# Patient Record
Sex: Male | Born: 1960 | Race: Black or African American | Hispanic: No | Marital: Single | Smoking: Current every day smoker
Health system: Southern US, Community
[De-identification: ages and names within clinical notes are randomized; demographics above are authoritative.]

## PROBLEM LIST (undated history)

## (undated) DIAGNOSIS — E042 Nontoxic multinodular goiter: Secondary | ICD-10-CM

## (undated) DIAGNOSIS — J449 Chronic obstructive pulmonary disease, unspecified: Secondary | ICD-10-CM

## (undated) DIAGNOSIS — K219 Gastro-esophageal reflux disease without esophagitis: Secondary | ICD-10-CM

## (undated) DIAGNOSIS — I10 Essential (primary) hypertension: Secondary | ICD-10-CM

## (undated) DIAGNOSIS — F259 Schizoaffective disorder, unspecified: Secondary | ICD-10-CM

## (undated) DIAGNOSIS — N184 Chronic kidney disease, stage 4 (severe): Secondary | ICD-10-CM

## (undated) HISTORY — PX: TOOTH EXTRACTION: SUR596

## (undated) HISTORY — PX: TONSILLECTOMY: SUR1361

---

## 2006-10-07 ENCOUNTER — Ambulatory Visit: Payer: Self-pay | Admitting: Nephrology

## 2008-07-05 ENCOUNTER — Ambulatory Visit: Payer: Self-pay | Admitting: Oncology

## 2008-07-27 LAB — MORPHOLOGY - CHCC SATELLITE
PLT EST ~~LOC~~: ADEQUATE
Platelet Morphology: NORMAL
RBC Comments: NORMAL

## 2008-07-27 LAB — CBC WITH DIFFERENTIAL (CANCER CENTER ONLY)
BASO%: 0.7 % (ref 0.0–2.0)
EOS%: 2.9 % (ref 0.0–7.0)
HCT: 42.7 % (ref 38.7–49.9)
LYMPH%: 44.5 % (ref 14.0–48.0)
MCH: 28 pg (ref 28.0–33.4)
MCHC: 32.5 g/dL (ref 32.0–35.9)
MCV: 86 fL (ref 82–98)
MONO#: 0.3 10*3/uL (ref 0.1–0.9)
MONO%: 6.7 % (ref 0.0–13.0)
NEUT%: 45.2 % (ref 40.0–80.0)
Platelets: 184 10*3/uL (ref 145–400)
RDW: 12.1 % (ref 10.5–14.6)

## 2008-07-27 LAB — CMP (CANCER CENTER ONLY)
ALT(SGPT): 18 U/L (ref 10–47)
AST: 23 U/L (ref 11–38)
Alkaline Phosphatase: 65 U/L (ref 26–84)
Creat: 2 mg/dl — ABNORMAL HIGH (ref 0.6–1.2)
Sodium: 140 mEq/L (ref 128–145)
Total Bilirubin: 0.5 mg/dl (ref 0.20–1.60)
Total Protein: 7.4 g/dL (ref 6.4–8.1)

## 2017-01-23 ENCOUNTER — Encounter: Payer: Self-pay | Admitting: *Deleted

## 2017-01-23 ENCOUNTER — Inpatient Hospital Stay
Admission: AD | Admit: 2017-01-23 | Discharge: 2017-01-28 | DRG: 683 | Disposition: A | Discharge status: Skilled Nursing Facility | Payer: Medicare Other | Source: Ambulatory Visit | Attending: Internal Medicine | Admitting: Internal Medicine

## 2017-01-23 DIAGNOSIS — R339 Retention of urine, unspecified: Secondary | ICD-10-CM | POA: Diagnosis present

## 2017-01-23 DIAGNOSIS — E861 Hypovolemia: Secondary | ICD-10-CM | POA: Diagnosis present

## 2017-01-23 DIAGNOSIS — F17210 Nicotine dependence, cigarettes, uncomplicated: Secondary | ICD-10-CM | POA: Diagnosis present

## 2017-01-23 DIAGNOSIS — J449 Chronic obstructive pulmonary disease, unspecified: Secondary | ICD-10-CM | POA: Diagnosis present

## 2017-01-23 DIAGNOSIS — F259 Schizoaffective disorder, unspecified: Secondary | ICD-10-CM | POA: Diagnosis present

## 2017-01-23 DIAGNOSIS — N179 Acute kidney failure, unspecified: Secondary | ICD-10-CM | POA: Diagnosis present

## 2017-01-23 DIAGNOSIS — E042 Nontoxic multinodular goiter: Secondary | ICD-10-CM | POA: Diagnosis present

## 2017-01-23 DIAGNOSIS — E210 Primary hyperparathyroidism: Secondary | ICD-10-CM | POA: Diagnosis present

## 2017-01-23 DIAGNOSIS — N184 Chronic kidney disease, stage 4 (severe): Secondary | ICD-10-CM | POA: Diagnosis present

## 2017-01-23 DIAGNOSIS — I129 Hypertensive chronic kidney disease with stage 1 through stage 4 chronic kidney disease, or unspecified chronic kidney disease: Secondary | ICD-10-CM | POA: Diagnosis present

## 2017-01-23 DIAGNOSIS — N320 Bladder-neck obstruction: Secondary | ICD-10-CM | POA: Diagnosis present

## 2017-01-23 DIAGNOSIS — E871 Hypo-osmolality and hyponatremia: Secondary | ICD-10-CM

## 2017-01-23 DIAGNOSIS — E876 Hypokalemia: Secondary | ICD-10-CM | POA: Diagnosis present

## 2017-01-23 DIAGNOSIS — E870 Hyperosmolality and hypernatremia: Secondary | ICD-10-CM | POA: Diagnosis present

## 2017-01-23 DIAGNOSIS — E785 Hyperlipidemia, unspecified: Secondary | ICD-10-CM | POA: Diagnosis present

## 2017-01-23 DIAGNOSIS — K219 Gastro-esophageal reflux disease without esophagitis: Secondary | ICD-10-CM | POA: Diagnosis present

## 2017-01-23 DIAGNOSIS — R319 Hematuria, unspecified: Secondary | ICD-10-CM | POA: Diagnosis present

## 2017-01-23 DIAGNOSIS — R338 Other retention of urine: Secondary | ICD-10-CM | POA: Diagnosis not present

## 2017-01-23 HISTORY — DX: Chronic obstructive pulmonary disease, unspecified: J44.9

## 2017-01-23 HISTORY — DX: Chronic kidney disease, stage 4 (severe): N18.4

## 2017-01-23 HISTORY — DX: Gastro-esophageal reflux disease without esophagitis: K21.9

## 2017-01-23 HISTORY — DX: Nontoxic multinodular goiter: E04.2

## 2017-01-23 HISTORY — DX: Schizoaffective disorder, unspecified: F25.9

## 2017-01-23 HISTORY — DX: Essential (primary) hypertension: I10

## 2017-01-23 LAB — COMPREHENSIVE METABOLIC PANEL
ALBUMIN: 4 g/dL (ref 3.5–5.0)
ALT: 24 U/L (ref 17–63)
ANION GAP: 11 (ref 5–15)
AST: 31 U/L (ref 15–41)
Alkaline Phosphatase: 117 U/L (ref 38–126)
BUN: 39 mg/dL — AB (ref 6–20)
CHLORIDE: 107 mmol/L (ref 101–111)
CO2: 20 mmol/L — ABNORMAL LOW (ref 22–32)
Calcium: 10.5 mg/dL — ABNORMAL HIGH (ref 8.9–10.3)
Creatinine, Ser: 4.87 mg/dL — ABNORMAL HIGH (ref 0.61–1.24)
GFR calc Af Amer: 14 mL/min — ABNORMAL LOW (ref >60)
GFR calc non Af Amer: 12 mL/min — ABNORMAL LOW (ref >60)
GLUCOSE: 136 mg/dL — AB (ref 65–99)
Potassium: 3.5 mmol/L (ref 3.5–5.1)
SODIUM: 138 mmol/L (ref 135–145)
Total Bilirubin: 0.5 mg/dL (ref 0.3–1.2)
Total Protein: 7.9 g/dL (ref 6.5–8.1)

## 2017-01-23 LAB — CBC
HCT: 37.1 % — ABNORMAL LOW (ref 40.0–52.0)
Hemoglobin: 12.5 g/dL — ABNORMAL LOW (ref 13.0–18.0)
MCH: 30.3 pg (ref 26.0–34.0)
MCHC: 33.7 g/dL (ref 32.0–36.0)
MCV: 90 fL (ref 80.0–100.0)
PLATELETS: 187 10*3/uL (ref 150–440)
RBC: 4.13 MIL/uL — ABNORMAL LOW (ref 4.40–5.90)
RDW: 15.2 % — AB (ref 11.5–14.5)
WBC: 4.3 10*3/uL (ref 3.8–10.6)

## 2017-01-23 LAB — MRSA PCR SCREENING: MRSA by PCR: NEGATIVE

## 2017-01-23 MED ORDER — EZETIMIBE 10 MG PO TABS
10.0000 mg | ORAL_TABLET | Freq: Every day | ORAL | Status: DC
  Administered 2017-01-23 – 2017-01-28 (×6): 10 mg via ORAL
  Filled 2017-01-23 (×6): qty 1

## 2017-01-23 MED ORDER — ASPIRIN 81 MG PO CHEW
81.0000 mg | CHEWABLE_TABLET | Freq: Every day | ORAL | Status: DC
  Administered 2017-01-23 – 2017-01-27 (×5): 81 mg via ORAL
  Filled 2017-01-23 (×5): qty 1

## 2017-01-23 MED ORDER — PRAVASTATIN SODIUM 20 MG PO TABS
40.0000 mg | ORAL_TABLET | Freq: Every day | ORAL | Status: DC
  Administered 2017-01-23 – 2017-01-27 (×5): 40 mg via ORAL
  Filled 2017-01-23 (×5): qty 2

## 2017-01-23 MED ORDER — METOPROLOL SUCCINATE ER 100 MG PO TB24
100.0000 mg | ORAL_TABLET | Freq: Every day | ORAL | Status: DC
  Administered 2017-01-23 – 2017-01-28 (×6): 100 mg via ORAL
  Filled 2017-01-23 (×6): qty 1

## 2017-01-23 MED ORDER — AMLODIPINE BESYLATE 5 MG PO TABS
5.0000 mg | ORAL_TABLET | Freq: Every day | ORAL | Status: DC
  Administered 2017-01-23 – 2017-01-28 (×6): 5 mg via ORAL
  Filled 2017-01-23 (×6): qty 1

## 2017-01-23 MED ORDER — RISPERIDONE 1 MG PO TABS
4.0000 mg | ORAL_TABLET | Freq: Every day | ORAL | Status: DC
Start: 2017-01-23 — End: 2017-01-28
  Administered 2017-01-23 – 2017-01-27 (×5): 4 mg via ORAL
  Filled 2017-01-23 (×5): qty 4
  Filled 2017-01-23: qty 2

## 2017-01-23 MED ORDER — QUETIAPINE FUMARATE 200 MG PO TABS
200.0000 mg | ORAL_TABLET | Freq: Two times a day (BID) | ORAL | Status: DC
  Administered 2017-01-24 – 2017-01-28 (×9): 200 mg via ORAL
  Filled 2017-01-23: qty 1
  Filled 2017-01-23: qty 8
  Filled 2017-01-23 (×5): qty 1
  Filled 2017-01-23 (×2): qty 8
  Filled 2017-01-23 (×2): qty 1

## 2017-01-23 MED ORDER — SODIUM CHLORIDE 0.9 % IV SOLN
INTRAVENOUS | Status: DC
  Administered 2017-01-23 – 2017-01-25 (×3): via INTRAVENOUS

## 2017-01-23 MED ORDER — QUETIAPINE FUMARATE 200 MG PO TABS
400.0000 mg | ORAL_TABLET | Freq: Every day | ORAL | Status: DC
  Administered 2017-01-23 – 2017-01-27 (×5): 400 mg via ORAL
  Filled 2017-01-23: qty 1
  Filled 2017-01-23 (×5): qty 2

## 2017-01-23 MED ORDER — PANTOPRAZOLE SODIUM 40 MG PO TBEC
40.0000 mg | DELAYED_RELEASE_TABLET | Freq: Every day | ORAL | Status: DC
  Administered 2017-01-23 – 2017-01-28 (×6): 40 mg via ORAL
  Filled 2017-01-23 (×6): qty 1

## 2017-01-23 MED ORDER — TIOTROPIUM BROMIDE MONOHYDRATE 18 MCG IN CAPS
18.0000 ug | ORAL_CAPSULE | Freq: Every day | RESPIRATORY_TRACT | Status: DC
  Administered 2017-01-23 – 2017-01-28 (×6): 18 ug via RESPIRATORY_TRACT
  Filled 2017-01-23 (×2): qty 5

## 2017-01-23 MED ORDER — ONDANSETRON HCL 4 MG PO TABS: 4.0000 mg | ORAL_TABLET | Freq: Four times a day (QID) | ORAL | Status: DC | PRN

## 2017-01-23 MED ORDER — ONDANSETRON HCL 4 MG/2ML IJ SOLN: 4.0000 mg | Freq: Four times a day (QID) | INTRAMUSCULAR | Status: DC | PRN

## 2017-01-23 MED ORDER — HEPARIN SODIUM (PORCINE) 5000 UNIT/ML IJ SOLN
5000.0000 [IU] | Freq: Three times a day (TID) | INTRAMUSCULAR | Status: DC
  Administered 2017-01-23 – 2017-01-26 (×8): 5000 [IU] via SUBCUTANEOUS
  Filled 2017-01-23 (×6): qty 1

## 2017-01-23 MED ORDER — ACETAMINOPHEN 325 MG PO TABS
650.0000 mg | ORAL_TABLET | Freq: Four times a day (QID) | ORAL | Status: DC | PRN
  Administered 2017-01-27: 650 mg via ORAL
  Filled 2017-01-23: qty 2

## 2017-01-23 MED ORDER — ACETAMINOPHEN 650 MG RE SUPP: 650.0000 mg | Freq: Four times a day (QID) | RECTAL | Status: DC | PRN

## 2017-01-23 NOTE — H&P (Signed)
[facility] [place] at [facility]   PATIENT NAME: Jason Diaz    MR#:  [id number]  DATE OF BIRTH:  Dec 09, 1960  DATE OF ADMISSION:  01/23/2017  PRIMARY CARE PHYSICIAN: Armando Gang, FNP   REQUESTING/REFERRING PHYSICIAN: Dr. Mosetta Pigeon  CHIEF COMPLAINT:  No chief complaint on file.   HISTORY OF PRESENT ILLNESS:  Jason Diaz  is a 56 y.o. male with a known history of COPD not on oxygen, hypertension, multinodular goiter, schizoaffective disorder, CK D stage IV who lives in a group home was sent to the hospital referred by his nephrologist. Patient feels completely fine and is not sure why he is in the hospital. Most of the information is obtained from old records and also speaking to his nephrologist. Patient has a baseline creatinine of 3.68 with a GFR of 18 from March 2018. When his PCP checked his most recent labs it seems like his creatinine was greater than 4 a GFR of 15. He was thought to be dehydrated and is being admitted for acute renal failure on CK D. He denies any vomiting, but complains of some nausea. No fevers or chills or chest pain. No breathing difficulties.  PAST MEDICAL HISTORY:   Past Medical History:  Diagnosis Date  . CKD (chronic kidney disease), stage IV (HCC)   . COPD (chronic obstructive pulmonary disease) (HCC)   . GERD (gastroesophageal reflux disease)   . Hypertension   . Multinodular goiter   . Schizoaffective disorder (HCC)     PAST SURGICAL HISTORY:   Past Surgical History:  Procedure Laterality Date  . TONSILLECTOMY    . TOOTH EXTRACTION      SOCIAL HISTORY:   Social History  Substance Use Topics  . Smoking status: Current Every Day Smoker    Packs/day: 0.25    Types: Cigarettes  . Smokeless tobacco: Not on file  . Alcohol use No    FAMILY HISTORY:   Family History  Problem Relation Age of Onset  . Kidney disease Father   . Diabetes Father     DRUG ALLERGIES:  Not on File No known drug  allergies  REVIEW OF SYSTEMS:   Review of Systems  Constitutional: Positive for malaise/fatigue. Negative for chills, fever and weight loss.  HENT: Negative for ear discharge, ear pain, hearing loss, nosebleeds and tinnitus.   Eyes: Negative for blurred vision, double vision and photophobia.  Respiratory: Negative for cough, hemoptysis, shortness of breath and wheezing.   Cardiovascular: Negative for chest pain, palpitations, orthopnea and leg swelling.  Gastrointestinal: Positive for nausea. Negative for abdominal pain, constipation, diarrhea, heartburn, melena and vomiting.  Genitourinary: Negative for dysuria, frequency, hematuria and urgency.  Musculoskeletal: Negative for back pain, myalgias and neck pain.  Skin: Negative for rash.  Neurological: Negative for dizziness, tingling, tremors, sensory change, speech change, focal weakness and headaches.  Endo/Heme/Allergies: Does not bruise/bleed easily.  Psychiatric/Behavioral: Negative for depression.    MEDICATIONS AT HOME:   Prior to Admission medications   Not on File      VITAL SIGNS:  Blood pressure (!) 140/97, pulse 83, temperature 98.6 F (37 C), temperature source Oral, resp. rate 18, height 5\' 5"  (1.651 m), weight 81.7 kg (180 lb 3.2 oz), SpO2 99 %.  PHYSICAL EXAMINATION:   Physical Exam  GENERAL:  56 y.o.-year-old patient lying in the bed with no acute distress.  EYES: Pupils equal, round, reactive to light and accommodation. No scleral icterus. Extraocular muscles intact.  HEENT: Head atraumatic, normocephalic.  Oropharynx and nasopharynx clear.  NECK:  Supple, no jugular venous distention.enalrged thyroid but soft and nodular,, no tenderness.  LUNGS: Normal breath sounds bilaterally, no wheezing, rales,rhonchi or crepitation. No use of accessory muscles of respiration.  CARDIOVASCULAR: S1, S2 normal. No murmurs, rubs, or gallops.  ABDOMEN: Soft, nontender, nondistended. Bowel sounds present. No organomegaly or  mass.  EXTREMITIES: No pedal edema, cyanosis, or clubbing.  NEUROLOGIC: Cranial nerves II through XII are intact. Muscle strength 5/5 in all extremities. Sensation intact. Gait not checked.  PSYCHIATRIC: The patient is alert and oriented x 1-2.  SKIN: No obvious rash, lesion, or ulcer.   LABORATORY PANEL:   CBC No results for input(s): WBC, HGB, HCT, PLT in the last 168 hours. ------------------------------------------------------------------------------------------------------------------  Chemistries  No results for input(s): NA, K, CL, CO2, GLUCOSE, BUN, CREATININE, CALCIUM, MG, AST, ALT, ALKPHOS, BILITOT in the last 168 hours.  Invalid input(s): GFRCGP ------------------------------------------------------------------------------------------------------------------  Cardiac Enzymes No results for input(s): TROPONINI in the last 168 hours. ------------------------------------------------------------------------------------------------------------------  RADIOLOGY:  No results found.  EKG:  No orders found for this or any previous visit.  IMPRESSION AND PLAN:   Jason Diaz  is a 56 y.o. male with a known history of COPD not on oxygen, hypertension, multinodular goiter, schizoaffective disorder, CK D stage IV who lives in a group home was sent to the hospital referred by his nephrologist.  #1 acute renal failure-on CK D stage IV versus progression of CK D-for now hold Lasix, gentle hydration. -Nephrology consulted. Avoid nephrotoxins. -Labs from today are pending -Monitor labs in a.m.  #2 GERD-on Protonix  #3 hypertension-continue home medications. Patient on Norvasc, Toprol. Lasix is on hold.  #4 schizoaffective disorder-continue Seroquel, risperidone, fluphenazine  #5 COPD-stable. Continue inhalers.  #6 DVT prophylaxis-on subcutaneous heparin    All the records are reviewed and case discussed with ED provider. Management plans discussed with the patient, family  and they are in agreement.  CODE STATUS: Full Code  TOTAL TIME TAKING CARE OF THIS PATIENT: 50 minutes.    Enid BaasKALISETTI,Tzirel Leonor M.D on 01/23/2017 at 6:59 PM  Between 7am to 6pm - Pager - [phone]  After 6pm go to [email or web address] - Social research officer, governmentpassword EPAS [facility]  [facility]  Office  [phone]  CC: Primary care physician; Armando GangLindley, Cheryl P, FNP

## 2017-01-24 ENCOUNTER — Inpatient Hospital Stay: Payer: Medicare Other

## 2017-01-24 LAB — BASIC METABOLIC PANEL
ANION GAP: 9 (ref 5–15)
BUN: 38 mg/dL — AB (ref 6–20)
CO2: 22 mmol/L (ref 22–32)
Calcium: 10.7 mg/dL — ABNORMAL HIGH (ref 8.9–10.3)
Chloride: 111 mmol/L (ref 101–111)
Creatinine, Ser: 4.57 mg/dL — ABNORMAL HIGH (ref 0.61–1.24)
GFR calc Af Amer: 16 mL/min — ABNORMAL LOW (ref >60)
GFR calc non Af Amer: 13 mL/min — ABNORMAL LOW (ref >60)
Glucose, Bld: 129 mg/dL — ABNORMAL HIGH (ref 65–99)
POTASSIUM: 3.3 mmol/L — AB (ref 3.5–5.1)
Sodium: 142 mmol/L (ref 135–145)

## 2017-01-24 LAB — CBC
HEMATOCRIT: 37.3 % — AB (ref 40.0–52.0)
HEMOGLOBIN: 12.6 g/dL — AB (ref 13.0–18.0)
MCH: 29.7 pg (ref 26.0–34.0)
MCHC: 33.7 g/dL (ref 32.0–36.0)
MCV: 88 fL (ref 80.0–100.0)
PLATELETS: 198 10*3/uL (ref 150–440)
RBC: 4.24 MIL/uL — ABNORMAL LOW (ref 4.40–5.90)
RDW: 14.8 % — ABNORMAL HIGH (ref 11.5–14.5)
WBC: 4.2 10*3/uL (ref 3.8–10.6)

## 2017-01-24 LAB — HIV ANTIBODY (ROUTINE TESTING W REFLEX): HIV Screen 4th Generation wRfx: NONREACTIVE

## 2017-01-24 MED ORDER — POLYETHYLENE GLYCOL 3350 17 GM/SCOOP PO POWD
1.0000 | Freq: Once | ORAL | Status: DC
  Filled 2017-01-24: qty 255

## 2017-01-24 MED ORDER — POLYETHYLENE GLYCOL 3350 17 G PO PACK
17.0000 g | PACK | Freq: Once | ORAL | Status: AC
  Administered 2017-01-24: 17 g via ORAL
  Filled 2017-01-24: qty 1

## 2017-01-24 NOTE — Progress Notes (Signed)
[facility] [place] [facility]   PATIENT NAME: Jason RockersMichael Diaz    MR#:  [id number]  DATE OF BIRTH:  June 15, 1961  SUBJECTIVE:   No acute events overnight  REVIEW OF SYSTEMS:    Review of Systems  Constitutional: Negative for fever, chills weight loss HENT: Negative for ear pain, nosebleeds, congestion, facial swelling, rhinorrhea, neck pain, neck stiffness and ear discharge.   Respiratory: Negative for cough, shortness of breath, wheezing  Cardiovascular: Negative for chest pain, palpitations and leg swelling.  Gastrointestinal: Negative for heartburn, abdominal pain, vomiting, diarrhea or consitpation Genitourinary: Negative for dysuria, urgency, frequency, hematuria Musculoskeletal: Negative for back pain or joint pain Neurological: Negative for dizziness, seizures, syncope, focal weakness,  numbness and headaches.  Hematological: Does not bruise/bleed easily.  Psychiatric/Behavioral: Schizoaffective disorder    Tolerating Diet: yes      DRUG ALLERGIES:  Not on File  VITALS:  Blood pressure (!) 166/95, pulse 79, temperature 97.9 F (36.6 C), temperature source Oral, resp. rate 18, height 5\' 5"  (1.651 m), weight 81.7 kg (180 lb 3.2 oz), SpO2 100 %.  PHYSICAL EXAMINATION:  Constitutional: Appears well-developed and well-nourished. No distress. HENT: Normocephalic. Marland Kitchen. Oropharynx is clear and moist.  Eyes: Conjunctivae and EOM are normal. PERRLA, no scleral icterus.  Neck: Normal ROM. Neck supple. No JVD. No tracheal deviation. CVS: RRR, S1/S2 +, no murmurs, no gallops, no carotid bruit.  Pulmonary: Effort and breath sounds normal, no stridor, rhonchi, wheezes, rales.  Abdominal: Soft. BS +,  no distension, tenderness, rebound or guarding.  Musculoskeletal: Normal range of motion. No edema and no tenderness.  Neuro: Alert. CN 2-12 grossly intact. No focal deficits. Skin: Skin is warm and dry. No rash noted. Psychiatric: Normal mood and affect.       LABORATORY PANEL:   CBC  Recent Labs Lab 01/24/17 0456  WBC 4.2  HGB 12.6*  HCT 37.3*  PLT 198   ------------------------------------------------------------------------------------------------------------------  Chemistries   Recent Labs Lab 01/23/17 1917 01/24/17 0456  NA 138 142  K 3.5 3.3*  CL 107 111  CO2 20* 22  GLUCOSE 136* 129*  BUN 39* 38*  CREATININE 4.87* 4.57*  CALCIUM 10.5* 10.7*  AST 31  --   ALT 24  --   ALKPHOS 117  --   BILITOT 0.5  --    ------------------------------------------------------------------------------------------------------------------  Cardiac Enzymes No results for input(s): TROPONINI in the last 168 hours. ------------------------------------------------------------------------------------------------------------------  RADIOLOGY:  No results found.   ASSESSMENT AND PLAN:   56 year old male with schizoaffective disorder who is from a group home who was sent by nephrologist due to increasing creatinine.   1. Acute on chronic stage IV kidney disease thought to be due to hypovolemia/prerenal azotemia This is improving with IV fluids BMP for a.m. Follow daily output Follow up on nephrology consult   2. Schizoaffective disorder: Continue Seroquel, Risperdal 3. COPD without exacerbation: Continue inhalers  4. Essential hypertension: Continue metoprolol and Norvasc  5. Hyperlipidemia: Continue Zetia and pravastatin     Management plans discussed with the patient and he is in agreement.  CODE STATUS: full  TOTAL TIME TAKING CARE OF THIS PATIENT: 30 minutes.     POSSIBLE D/C tomorrow, DEPENDING ON CLINICAL CONDITION.   Karmine Kauer M.D on 01/24/2017 at 8:38 AM  Between 7am to 6pm - Pager - [phone] After 6pm go to [email or web address] - Social research officer, governmentpassword EPAS [facility]  [facility]  Office  [phone]  CC: Primary care physician; Armando GangLindley, Cheryl P, FNP  Note: This  dictation was prepared with  Dragon dictation along with smaller phrase technology. Any transcriptional errors that result from this process are unintentional.

## 2017-01-24 NOTE — Progress Notes (Signed)
[facility] [place], [place] 01/24/17  Subjective:   Patient is known to our practice and is followed for CKD He had labs checked at PMD office. S Cr was higher than baseline. Patient was admitted for ARF This morning, he is somnolent but able to answer questions He denies any acute complaints  Objective:  Vital signs in last 24 hours:  Temp:  [97.5 F (36.4 C)-98.6 F (37 C)] 97.9 F (36.6 C) (06/22 0623) Pulse Rate:  [79-85] 80 (06/22 1111) Resp:  [18] 18 (06/22 0623) BP: (136-166)/(87-122) 136/87 (06/22 1111) SpO2:  [99 %-100 %] 100 % (06/22 0623) Weight:  [81.7 kg (180 lb 3.2 oz)] 81.7 kg (180 lb 3.2 oz) (06/21 1743)  Weight change:  Filed Weights   01/23/17 1743  Weight: 81.7 kg (180 lb 3.2 oz)    Intake/Output:    Intake/Output Summary (Last 24 hours) at 01/24/17 1227 Last data filed at 01/24/17 0954  Gross per 24 hour  Intake           696.99 ml  Output             1650 ml  Net          -953.01 ml     Physical Exam: General: NAD, laying in bed  HEENT Anicteric, moist oral mucus membranes  Neck supple  Pulm/lungs Clear b/l  CVS/Heart Regular; no rub  Abdomen:  Soft, distended,   Extremities: No edema  Neurologic: Alert; able to answer simple questions  Skin: No acute rashes          Basic Metabolic Panel:   Recent Labs Lab 01/23/17 1917 01/24/17 0456  NA 138 142  K 3.5 3.3*  CL 107 111  CO2 20* 22  GLUCOSE 136* 129*  BUN 39* 38*  CREATININE 4.87* 4.57*  CALCIUM 10.5* 10.7*     CBC:  Recent Labs Lab 01/23/17 1917 01/24/17 0456  WBC 4.3 4.2  HGB 12.5* 12.6*  HCT 37.1* 37.3*  MCV 90.0 88.0  PLT 187 198     No results found for: HEPBSAG, HEPBSAB, HEPBIGM    Microbiology:  Recent Results (from the past 240 hour(s))  MRSA PCR Screening     Status: None   Collection Time: 01/23/17  6:16 PM  Result Value Ref Range Status   MRSA by PCR NEGATIVE NEGATIVE Final    Comment:        The GeneXpert MRSA Assay  (FDA approved for NASAL specimens only), is one component of a comprehensive MRSA colonization surveillance program. It is not intended to diagnose MRSA infection nor to guide or monitor treatment for MRSA infections.     Coagulation Studies: No results for input(s): LABPROT, INR in the last 72 hours.  Urinalysis: No results for input(s): COLORURINE, LABSPEC, PHURINE, GLUCOSEU, HGBUR, BILIRUBINUR, KETONESUR, PROTEINUR, UROBILINOGEN, NITRITE, LEUKOCYTESUR in the last 72 hours.  Invalid input(s): APPERANCEUR    Imaging: No results found.   Medications:   . sodium chloride 60 mL/hr at 01/23/17 2021   . amLODipine  5 mg Oral Daily  . aspirin  81 mg Oral Daily  . ezetimibe  10 mg Oral Daily  . heparin  5,000 Units Subcutaneous Q8H  . metoprolol succinate  100 mg Oral Daily  . pantoprazole  40 mg Oral Daily  . polyethylene glycol  17 g Oral Once  . pravastatin  40 mg Oral q1800  . QUEtiapine  200 mg Oral BID  . QUEtiapine  400 mg Oral QHS  .  risperiDONE  4 mg Oral QHS  . tiotropium  18 mcg Inhalation Daily   acetaminophen **OR** acetaminophen, ondansetron **OR** ondansetron (ZOFRAN) IV  Assessment/ Plan:  56 y.o. AA male with COPD, HTN, Multinodular goiter, Schizoaffective disorder, CKD st 4, Primary hyperparathyroidism with hypercalcemia admitted for ARF  1. ARF on CKD st 4 Baseline Cr 3.39/GFR 23 from march 2018 Admit Cr 4.87 which improved some to 4.57 today with iv fluids Agree with holding lasix for now May use it PRN Will obtain renal ultrasound  2. Hypercalcemia with primary hyperparathyroidism Mild hypercalcemia, continue iv hydration Followed by Dr Aliene Altes Recommended total thyroidectomy and parathyroidectomy as outpatient  Patient has outpatient f/u at our office on 01/31/17 at 12 PM   LOS: 1 [facility] 6/22/201812:27 PM  [facility] [address] [place], [place] [phone]

## 2017-01-24 NOTE — Clinical Social Work Note (Signed)
Clinical Social Work Assessment  Patient Details  Name: Jason Diaz MRN: [id number] Date of Birth: November 15, 1960  Date of referral:  01/24/17               Reason for consult:  Facility Placement                Permission sought to share information with:    Permission granted to share information::     Name::        Agency::     Relationship::     Contact Information:     Housing/Transportation Living arrangements for the past 2 months:  Group Home Source of Information:  Facility Patient Interpreter Needed:  None Criminal Activity/Legal Involvement Pertinent to Current Situation/Hospitalization:  No - Comment as needed Significant Relationships:  Parents Lives with:  Facility Resident Do you feel safe going back to the place where you live?  Yes Need for family participation in patient care:  Yes (Comment)  Care giving concerns:  [facility] resides at [facility].   Social Worker assessment / plan:  Patient currently sleeping and patient's nurse stated patient had been up all night and he needed to rest. CSW contacted patient's group home and spoke with the owner: Jason Diaz: cell: [phone]. Ms. Jason Diaz stated that patient can return when time and CSW informed her it may be as early as tomorrow. She has requested that staff call her on her cell phone and she will provide transportation. Patient's mother is heavily involved in his decision making and CSW is told that there is no official guardian, hpoa, or poa for patient. Fl2 initiated and will need completion at time of discharge.  Employment status:  Disabled (Comment on whether or not currently receiving Disability) Insurance information:  Medicare PT Recommendations:    Information / Referral to community resources:     Patient/Family's Response to care:  Unable to speak with patient at this time.  Patient/Family's Understanding of and Emotional Response to Diagnosis, Current Treatment, and  Prognosis:  Unable to speak with patient at this time.   Emotional Assessment Appearance:  Appears stated age Attitude/Demeanor/Rapport:   (pleasant and cooperative) Affect (typically observed):  Calm, Pleasant Orientation:  Oriented to Self, Oriented to Place Alcohol / Substance use:  Not Applicable Psych involvement (Current and /or in the community):  Outpatient Provider  Discharge Needs  Concerns to be addressed:  Care Coordination Readmission within the last 30 days:  No Current discharge risk:  None Barriers to Discharge:  No Barriers Identified   Jason SpanielMonica Jason Gonyer, LCSW 01/24/2017, 11:39 AM

## 2017-01-25 LAB — BASIC METABOLIC PANEL
ANION GAP: 11 (ref 5–15)
BUN: 37 mg/dL — AB (ref 6–20)
CO2: 22 mmol/L (ref 22–32)
Calcium: 10.8 mg/dL — ABNORMAL HIGH (ref 8.9–10.3)
Chloride: 109 mmol/L (ref 101–111)
Creatinine, Ser: 3.89 mg/dL — ABNORMAL HIGH (ref 0.61–1.24)
GFR calc non Af Amer: 16 mL/min — ABNORMAL LOW (ref >60)
GFR, EST AFRICAN AMERICAN: 19 mL/min — AB (ref >60)
Glucose, Bld: 116 mg/dL — ABNORMAL HIGH (ref 65–99)
POTASSIUM: 3.7 mmol/L (ref 3.5–5.1)
SODIUM: 142 mmol/L (ref 135–145)

## 2017-01-25 LAB — RENAL FUNCTION PANEL
Albumin: 3.7 g/dL (ref 3.5–5.0)
Anion gap: 9 (ref 5–15)
BUN: 36 mg/dL — ABNORMAL HIGH (ref 6–20)
CHLORIDE: 114 mmol/L — AB (ref 101–111)
CO2: 23 mmol/L (ref 22–32)
CREATININE: 4.3 mg/dL — AB (ref 0.61–1.24)
Calcium: 10.6 mg/dL — ABNORMAL HIGH (ref 8.9–10.3)
GFR, EST AFRICAN AMERICAN: 17 mL/min — AB (ref >60)
GFR, EST NON AFRICAN AMERICAN: 14 mL/min — AB (ref >60)
Glucose, Bld: 114 mg/dL — ABNORMAL HIGH (ref 65–99)
PHOSPHORUS: 3.7 mg/dL (ref 2.5–4.6)
Potassium: 3.4 mmol/L — ABNORMAL LOW (ref 3.5–5.1)
Sodium: 146 mmol/L — ABNORMAL HIGH (ref 135–145)

## 2017-01-25 MED ORDER — AMLODIPINE BESYLATE 5 MG PO TABS
5.0000 mg | ORAL_TABLET | Freq: Once | ORAL | Status: AC
Start: 2017-01-25 — End: 2017-01-25
  Administered 2017-01-25: 5 mg via ORAL
  Filled 2017-01-25: qty 1

## 2017-01-25 MED ORDER — TAMSULOSIN HCL 0.4 MG PO CAPS
0.4000 mg | ORAL_CAPSULE | Freq: Every day | ORAL | Status: DC
  Administered 2017-01-25 – 2017-01-28 (×4): 0.4 mg via ORAL
  Filled 2017-01-25 (×4): qty 1

## 2017-01-25 MED ORDER — POTASSIUM CHLORIDE IN NACL 20-0.45 MEQ/L-% IV SOLN
INTRAVENOUS | Status: DC
  Administered 2017-01-25: 12:00:00 via INTRAVENOUS
  Filled 2017-01-25 (×2): qty 1000

## 2017-01-25 MED ORDER — FINASTERIDE 5 MG PO TABS
5.0000 mg | ORAL_TABLET | Freq: Every day | ORAL | Status: DC
  Administered 2017-01-25 – 2017-01-28 (×4): 5 mg via ORAL
  Filled 2017-01-25 (×4): qty 1

## 2017-01-25 NOTE — Progress Notes (Signed)
Notified Dr. Winona LegatoVaickute of BP of 159/102. IV fluids have been stopped at this time and will continue to monitor and call with BP in 1 hour.

## 2017-01-25 NOTE — Progress Notes (Signed)
[facility], [place] 01/25/17  Subjective:   Patient is known to our practice from outpatient and is followed for CKD He had labs checked at PMD office. S Cr was higher than baseline. Patient was admitted for ARF This morning, he is doing well. He denies any acute complaints Nursing staff report urinary retention.  He voids over 1 L and overflows the urinal  Objective:  Vital signs in last 24 hours:  Temp:  [97.9 F (36.6 C)-98.2 F (36.8 C)] 97.9 F (36.6 C) (06/23 0449) Pulse Rate:  [78-88] 80 (06/23 1013) Resp:  [16-19] 16 (06/23 0449) BP: (128-167)/(73-120) 128/73 (06/23 1013) SpO2:  [99 %-100 %] 100 % (06/23 0449)  Weight change:  Filed Weights   01/23/17 1743  Weight: 81.7 kg (180 lb 3.2 oz)    Intake/Output:    Intake/Output Summary (Last 24 hours) at 01/25/17 1125 Last data filed at 01/25/17 0720  Gross per 24 hour  Intake             1406 ml  Output             2000 ml  Net             -594 ml     Physical Exam: General: NAD, laying in bed  HEENT Anicteric, moist oral mucus membranes  Neck supple  Pulm/lungs Clear b/l  CVS/Heart Regular; no rub  Abdomen:  Soft, distended,   Extremities: No edema  Neurologic: Alert; able to answer simple questions  Skin: No acute rashes          Basic Metabolic Panel:   Recent Labs Lab 01/23/17 1917 01/24/17 0456 01/25/17 0333  NA 138 142 146*  K 3.5 3.3* 3.4*  CL 107 111 114*  CO2 20* 22 23  GLUCOSE 136* 129* 114*  BUN 39* 38* 36*  CREATININE 4.87* 4.57* 4.30*  CALCIUM 10.5* 10.7* 10.6*  PHOS  --   --  3.7     CBC:  Recent Labs Lab 01/23/17 1917 01/24/17 0456  WBC 4.3 4.2  HGB 12.5* 12.6*  HCT 37.1* 37.3*  MCV 90.0 88.0  PLT 187 198     No results found for: HEPBSAG, HEPBSAB, HEPBIGM    Microbiology:  Recent Results (from the past 240 hour(s))  MRSA PCR Screening     Status: None   Collection Time: 01/23/17  6:16 PM  Result Value Ref Range Status   MRSA by PCR NEGATIVE NEGATIVE Final    Comment:        The GeneXpert MRSA Assay (FDA approved for NASAL specimens only), is one component of a comprehensive MRSA colonization surveillance program. It is not intended to diagnose MRSA infection nor to guide or monitor treatment for MRSA infections.     Coagulation Studies: No results for input(s): LABPROT, INR in the last 72 hours.  Urinalysis: No results for input(s): COLORURINE, LABSPEC, PHURINE, GLUCOSEU, HGBUR, BILIRUBINUR, KETONESUR, PROTEINUR, UROBILINOGEN, NITRITE, LEUKOCYTESUR in the last 72 hours.  Invalid input(s): APPERANCEUR    Imaging: US Renal  Result Date: 01/24/2017 CLINICAL DATA:  Acute renal failure. EXAM: RENAL / URINARY TRACT ULTRASOUND COMPLETE COMPARISON:  None. FINDINGS: Right Kidney: Length: 8.8 cm. Increased echogenicity of renal parenchyma is noted suggesting medical renal disease. 1.5 cm simple cyst is noted in midpole. No mass or hydronephrosis visualized. Left Kidney: Length: 7.6 cm. Increased echogenicity of renal parenchyma is noted suggesting medical renal disease. 1.2 cm simple cyst is noted in midpole. No mass or hydronephrosis  visualized. Bladder: Mild urinary bladder distention is noted. Calculated prevoid volume of 1016 mL was noted. Patient refused to void. IMPRESSION: Bilateral renal atrophy is noted with increased echogenicity of renal parenchyma consistent with medical renal disease. Bilateral simple renal cysts are noted. Mild urinary bladder distention is noted. Electronically Signed   By: Lupita RaiderJames  Green Jr, M.D.   On: 01/24/2017 15:00     Medications:   . sodium chloride 60 mL/hr at 01/25/17 1012   . amLODipine  5 mg Oral Daily  . aspirin  81 mg Oral Daily  . ezetimibe  10 mg Oral Daily  . heparin  5,000 Units Subcutaneous Q8H  . metoprolol succinate  100 mg Oral Daily  . pantoprazole  40 mg Oral Daily  . pravastatin  40 mg Oral q1800  . QUEtiapine  200 mg Oral BID  . QUEtiapine  400  mg Oral QHS  . risperiDONE  4 mg Oral QHS  . tamsulosin  0.4 mg Oral Daily  . tiotropium  18 mcg Inhalation Daily   acetaminophen **OR** acetaminophen, ondansetron **OR** ondansetron (ZOFRAN) IV  Assessment/ Plan:  56 y.o. AA male with COPD, HTN, Multinodular goiter, Schizoaffective disorder, CKD st 4, Primary hyperparathyroidism with hypercalcemia admitted for ARF  1. ARF on CKD st 4 Baseline Cr 3.39/GFR 23 from march 2018 Admit Cr 4.87 which improved some to 4.30 today with iv fluids Agree with holding lasix for now May use it PRN Renal ultrasound shows echogenic kidneys  2. Hypercalcemia with primary hyperparathyroidism Mild hypercalcemia, continue iv hydration Followed by Dr Aliene AltesAbisogun who has in the past recommended total thyroidectomy and parathyroidectomy as outpatient  3. Hypokalemia and hypernatremia - change iv fluids to 1/2 NS with Kcl  Patient has outpatient f/u at our office on 01/31/17 at 12 PM   LOS: 2 [facility]Anvitha Hutmacher 6/23/201811:25 AM  [facility] [place], [place] [phone]

## 2017-01-25 NOTE — Progress Notes (Signed)
Notified Dr. Winona LegatoVaickute of bladder scan volume greater than 999. Per MD place foley.

## 2017-01-25 NOTE — Progress Notes (Signed)
Notified Dr. Winona LegatoVaickute that pt still has an elevated BP. Per MD place order for 5mg  of norvasc in addition to dose earlier this AM. Pt appears to be somewhat more agitated, but did not receive 8am seroquel because it was not sent up until late. Will monitor and reassess.

## 2017-01-25 NOTE — Progress Notes (Signed)
[facility] - [place] at [facility]   PATIENT NAME: Jason Diaz    MR#:  [id number]  DATE OF BIRTH:  Jun 01, 1961  SUBJECTIVE:   No acute events overnight, Complains of difficulty voiding, not able to empty her bladder well. Bladder scan revealed more than 999 cc, Foley catheter is going to be placed. Creatinine has been improving with IV fluid administration.  REVIEW OF SYSTEMS:    Review of Systems  Constitutional: Negative for fever, chills weight loss HENT: Negative for ear pain, nosebleeds, congestion, facial swelling, rhinorrhea, neck pain, neck stiffness and ear discharge.   Respiratory: Negative for cough, shortness of breath, wheezing  Cardiovascular: Negative for chest pain, palpitations and leg swelling.  Gastrointestinal: Negative for heartburn, abdominal pain, vomiting, diarrhea or consitpation Genitourinary: Negative for dysuria, urgency, frequency, hematuria. Admits of difficulty voiding, emptying bladder Musculoskeletal: Negative for back pain or joint pain Neurological: Negative for dizziness, seizures, syncope, focal weakness,  numbness and headaches.  Hematological: Does not bruise/bleed easily.  Psychiatric/Behavioral: Schizoaffective disorder    Tolerating Diet: yes      DRUG ALLERGIES:  Not on File  VITALS:  Blood pressure 128/73, pulse 80, temperature 97.9 F (36.6 C), temperature source Oral, resp. rate 16, height 5\' 5"  (1.651 m), weight 81.7 kg (180 lb 3.2 oz), SpO2 100 %.  PHYSICAL EXAMINATION:  Constitutional: Appears well-developed and well-nourished. No distress. HENT: Normocephalic. Marland Kitchen Oropharynx is clear and moist.  Eyes: Conjunctivae and EOM are normal. PERRLA, no scleral icterus.  Neck: Normal ROM. Neck supple. No JVD. No tracheal deviation. CVS: RRR, S1/S2 +, no murmurs, no gallops, no carotid bruit.  Pulmonary: Effort and breath sounds normal, no stridor, rhonchi, wheezes, rales.  Abdominal: Soft. BS +,  no distension,  tenderness, rebound or guarding.  Musculoskeletal: Normal range of motion. No edema and no tenderness.  Neuro: Alert. CN 2-12 grossly intact. No focal deficits. Skin: Skin is warm and dry. No rash noted. Psychiatric: Normal mood and affect.      LABORATORY PANEL:   CBC  Recent Labs Lab 01/24/17 0456  WBC 4.2  HGB 12.6*  HCT 37.3*  PLT 198   ------------------------------------------------------------------------------------------------------------------  Chemistries   Recent Labs Lab 01/23/17 1917  01/25/17 0333  NA 138  < > 146*  K 3.5  < > 3.4*  CL 107  < > 114*  CO2 20*  < > 23  GLUCOSE 136*  < > 114*  BUN 39*  < > 36*  CREATININE 4.87*  < > 4.30*  CALCIUM 10.5*  < > 10.6*  AST 31  --   --   ALT 24  --   --   ALKPHOS 117  --   --   BILITOT 0.5  --   --   < > = values in this interval not displayed. ------------------------------------------------------------------------------------------------------------------  Cardiac Enzymes No results for input(s): TROPONINI in the last 168 hours. ------------------------------------------------------------------------------------------------------------------  RADIOLOGY:  US Renal  Result Date: 01/24/2017 CLINICAL DATA:  Acute renal failure. EXAM: RENAL / URINARY TRACT ULTRASOUND COMPLETE COMPARISON:  None. FINDINGS: Right Kidney: Length: 8.8 cm. Increased echogenicity of renal parenchyma is noted suggesting medical renal disease. 1.5 cm simple cyst is noted in midpole. No mass or hydronephrosis visualized. Left Kidney: Length: 7.6 cm. Increased echogenicity of renal parenchyma is noted suggesting medical renal disease. 1.2 cm simple cyst is noted in midpole. No mass or hydronephrosis visualized. Bladder: Mild urinary bladder distention is noted. Calculated prevoid volume of 1016 mL was noted. Patient refused  to void. IMPRESSION: Bilateral renal atrophy is noted with increased echogenicity of renal parenchyma consistent with  medical renal disease. Bilateral simple renal cysts are noted. Mild urinary bladder distention is noted. Electronically Signed   By: Lupita RaiderJames  Green Jr, M.D.   On: 01/24/2017 15:00     ASSESSMENT AND PLAN:   56 year old male with schizoaffective disorder who is from a group home who was sent by nephrologist due to increasing creatinine.   1. Acute Renal failure on chronic stage IV kidney disease thought to be due to hypovolemia/prerenal azotemia, also due to bladder outlet obstruction, status post Foley catheter placement today This is improving with IV fluids BMP for a.m. Follow daily output Discussed with nephrologist, Dr. Thedore MinsSingh  2. Schizoaffective disorder: Continue Seroquel, Risperdal  3. COPD without exacerbation: Continue inhalers  4. Essential hypertension: Continue metoprolol and Norvasc  5. Hyperlipidemia: Continue Zetia and pravastatin  6. Hypernatremia, likely due to IV fluids, change IV fluids to half-normal, recheck BMP later today  7. Hypokalemia, supplemented intravenously, check potassium level later today  8. Hypercalcemia, get intact PTH . Etiology is unclear, although patient is on vitamin D supplementation daily   Management plans discussed with the patient and he is in agreement.  CODE STATUS: full  TOTAL TIME TAKING CARE OF THIS PATIENT: 35 minutes.     POSSIBLE D/C tomorrow, DEPENDING ON CLINICAL CONDITION.   Katharina CaperVAICKUTE,Peregrine Nolt M.D on 01/25/2017 at 12:50 PM  Between 7am to 6pm - Pager - [phone] After 6pm go to [email or web address] - password [email or web address]  [facility]  Office  [phone]  CC: Primary care physician; Armando GangLindley, Cheryl P, FNP  Note: This dictation was prepared with Dragon dictation along with smaller phrase technology. Any transcriptional errors that result from this process are unintentional.

## 2017-01-26 LAB — RENAL FUNCTION PANEL
Albumin: 4 g/dL (ref 3.5–5.0)
Anion gap: 10 (ref 5–15)
BUN: 38 mg/dL — ABNORMAL HIGH (ref 6–20)
CO2: 22 mmol/L (ref 22–32)
CREATININE: 3.72 mg/dL — AB (ref 0.61–1.24)
Calcium: 11 mg/dL — ABNORMAL HIGH (ref 8.9–10.3)
Chloride: 111 mmol/L (ref 101–111)
GFR calc Af Amer: 20 mL/min — ABNORMAL LOW (ref >60)
GFR, EST NON AFRICAN AMERICAN: 17 mL/min — AB (ref >60)
GLUCOSE: 102 mg/dL — AB (ref 65–99)
POTASSIUM: 3.6 mmol/L (ref 3.5–5.1)
Phosphorus: 3.6 mg/dL (ref 2.5–4.6)
Sodium: 143 mmol/L (ref 135–145)

## 2017-01-26 LAB — PARATHYROID HORMONE, INTACT (NO CA): PTH: 89 pg/mL — AB (ref 15–65)

## 2017-01-26 MED ORDER — POLYETHYLENE GLYCOL 3350 17 G PO PACK
17.0000 g | PACK | Freq: Every day | ORAL | Status: DC
  Administered 2017-01-26 – 2017-01-28 (×3): 17 g via ORAL
  Filled 2017-01-26 (×3): qty 1

## 2017-01-26 MED ORDER — METOPROLOL TARTRATE 5 MG/5ML IV SOLN: 2.5000 mg | Freq: Once | INTRAVENOUS | Status: DC

## 2017-01-26 NOTE — Clinical Social Work Note (Signed)
CSW received consult that patient is a group home resident. CSW already aware, and the patient has already been assessed as noted in the chart on 01/24/2017 by CSW [place].   Jason Diaz, MSW, Theresia MajorsLCSWA [phone]

## 2017-01-26 NOTE — Progress Notes (Signed)
[facility], [place] 01/26/17  Subjective:   Patient is known to our practice from outpatient and is followed for CKD He had labs checked at PMD office. S Cr was higher than baseline. Patient was admitted for ARF   Patient was found to have urinary retention. Foley cathter has been placed UOP > 4800 cc after foley placement Getting ready for breakfast No c/o   Objective:  Vital signs in last 24 hours:  Temp:  [97.7 F (36.5 C)-98.1 F (36.7 C)] 98.1 F (36.7 C) (06/24 0441) Pulse Rate:  [80-96] 96 (06/24 0441) Resp:  [18-20] 20 (06/24 0441) BP: (128-168)/(73-114) 142/90 (06/24 0900) SpO2:  [99 %-100 %] 99 % (06/24 0441)  Weight change:  Filed Weights   01/23/17 1743  Weight: 81.7 kg (180 lb 3.2 oz)    Intake/Output:    Intake/Output Summary (Last 24 hours) at 01/26/17 0947 Last data filed at 01/26/17 0439  Gross per 24 hour  Intake              521 ml  Output             4800 ml  Net            -4279 ml     Physical Exam: General: NAD, laying in bed  HEENT Anicteric, moist oral mucus membranes  Neck supple  Pulm/lungs Clear b/l  CVS/Heart Regular; no rub  Abdomen:  Soft, distended,   Extremities: No edema  Neurologic: Alert; able to answer simple questions  Skin: No acute rashes   Foley with blood tinged urine       Basic Metabolic Panel:   Recent Labs Lab 01/23/17 1917 01/24/17 0456 01/25/17 0333 01/25/17 1610 01/26/17 0410  NA 138 142 146* 142 143  K 3.5 3.3* 3.4* 3.7 3.6  CL 107 111 114* 109 111  CO2 20* 22 23 22 22   GLUCOSE 136* 129* 114* 116* 102*  BUN 39* 38* 36* 37* 38*  CREATININE 4.87* 4.57* 4.30* 3.89* 3.72*  CALCIUM 10.5* 10.7* 10.6* 10.8* 11.0*  PHOS  --   --  3.7  --  3.6     CBC:  Recent Labs Lab 01/23/17 1917 01/24/17 0456  WBC 4.3 4.2  HGB 12.5* 12.6*  HCT 37.1* 37.3*  MCV 90.0 88.0  PLT 187 198     No results found for: HEPBSAG, HEPBSAB, HEPBIGM    Microbiology:  Recent  Results (from the past 240 hour(s))  MRSA PCR Screening     Status: None   Collection Time: 01/23/17  6:16 PM  Result Value Ref Range Status   MRSA by PCR NEGATIVE NEGATIVE Final    Comment:        The GeneXpert MRSA Assay (FDA approved for NASAL specimens only), is one component of a comprehensive MRSA colonization surveillance program. It is not intended to diagnose MRSA infection nor to guide or monitor treatment for MRSA infections.     Coagulation Studies: No results for input(s): LABPROT, INR in the last 72 hours.  Urinalysis: No results for input(s): COLORURINE, LABSPEC, PHURINE, GLUCOSEU, HGBUR, BILIRUBINUR, KETONESUR, PROTEINUR, UROBILINOGEN, NITRITE, LEUKOCYTESUR in the last 72 hours.  Invalid input(s): APPERANCEUR    Imaging: US Renal  Result Date: 01/24/2017 CLINICAL DATA:  Acute renal failure. EXAM: RENAL / URINARY TRACT ULTRASOUND COMPLETE COMPARISON:  None. FINDINGS: Right Kidney: Length: 8.8 cm. Increased echogenicity of renal parenchyma is noted suggesting medical renal disease. 1.5 cm simple cyst is noted in midpole. No mass  or hydronephrosis visualized. Left Kidney: Length: 7.6 cm. Increased echogenicity of renal parenchyma is noted suggesting medical renal disease. 1.2 cm simple cyst is noted in midpole. No mass or hydronephrosis visualized. Bladder: Mild urinary bladder distention is noted. Calculated prevoid volume of 1016 mL was noted. Patient refused to void. IMPRESSION: Bilateral renal atrophy is noted with increased echogenicity of renal parenchyma consistent with medical renal disease. Bilateral simple renal cysts are noted. Mild urinary bladder distention is noted. Electronically Signed   By: Lupita RaiderJames  Green Jr, M.D.   On: 01/24/2017 15:00     Medications:    . amLODipine  5 mg Oral Daily  . aspirin  81 mg Oral Daily  . ezetimibe  10 mg Oral Daily  . finasteride  5 mg Oral Daily  . metoprolol succinate  100 mg Oral Daily  . pantoprazole  40 mg Oral  Daily  . pravastatin  40 mg Oral q1800  . QUEtiapine  200 mg Oral BID  . QUEtiapine  400 mg Oral QHS  . risperiDONE  4 mg Oral QHS  . tamsulosin  0.4 mg Oral Daily  . tiotropium  18 mcg Inhalation Daily   acetaminophen **OR** acetaminophen, ondansetron **OR** ondansetron (ZOFRAN) IV  Assessment/ Plan:  56 y.o. AA male with COPD, HTN, Multinodular goiter, Schizoaffective disorder, CKD st 4, Primary hyperparathyroidism with hypercalcemia admitted for ARF  1. ARF on CKD st 4  Baseline Cr 3.39/GFR 23 from march 2018 Admit Cr 4.87 which improved some to 3.72 today with iv fluids and foley placement Agree with holding lasix for now May use it PRN Renal ultrasound shows echogenic kidneys  2. Hypercalcemia with primary hyperparathyroidism Mild hypercalcemia, continue iv hydration Followed by Dr Aliene AltesAbisogun who has in the past recommended total thyroidectomy and parathyroidectomy as outpatient  3. Hypokalemia and hypernatremia - continue 1/2 NS with Kcl  4. Urinary retention Flomax started empirically ?  Medication related  5. Hematuria - likely foley trauma - monitor  Patient has outpatient f/u at our office on 01/31/17 at 12 PM   LOS: 3 [facility],Dreyah Montrose 6/24/20189:47 AM  [facility] [place], [place] [phone]

## 2017-01-26 NOTE — Progress Notes (Signed)
[employer] - [place] at [facility]   PATIENT NAME: Jason RockersMichael Nantz    MR#:  [id number]  DATE OF BIRTH:  02-13-1961  SUBJECTIVE:   No acute events overnight, Complains of difficulty voiding, not able to empty her bladder well. Bladder scan revealed more than 999 cc, Foley catheter was  Placed,  above 1900 cc was drained yesterday. Creatinine has improved with IV fluid administration. The patient feels good today, patient's urine is pink and Foley catheter. REVIEW OF SYSTEMS:    Review of Systems  Constitutional: Negative for fever, chills weight loss HENT: Negative for ear pain, nosebleeds, congestion, facial swelling, rhinorrhea, neck pain, neck stiffness and ear discharge.   Respiratory: Negative for cough, shortness of breath, wheezing  Cardiovascular: Negative for chest pain, palpitations and leg swelling.  Gastrointestinal: Negative for heartburn, abdominal pain, vomiting, diarrhea or consitpation Genitourinary: Negative for dysuria, urgency, frequency, hematuria. Admits of difficulty voiding, emptying bladder Musculoskeletal: Negative for back pain or joint pain Neurological: Negative for dizziness, seizures, syncope, focal weakness,  numbness and headaches.  Hematological: Does not bruise/bleed easily.  Psychiatric/Behavioral: Schizoaffective disorder    Tolerating Diet: yes      DRUG ALLERGIES:  Not on File  VITALS:  Blood pressure (!) 142/90, pulse 96, temperature 98.1 F (36.7 C), temperature source Oral, resp. rate 20, height 5\' 5"  (1.651 m), weight 81.7 kg (180 lb 3.2 oz), SpO2 99 %.  PHYSICAL EXAMINATION:  Constitutional: Appears well-developed and well-nourished. No distress. HENT: Normocephalic. Marland Kitchen. Oropharynx is clear and moist.  Eyes: Conjunctivae and EOM are normal. PERRLA, no scleral icterus.  Neck: Normal ROM. Neck supple. No JVD. No tracheal deviation. CVS: RRR, S1/S2 +, no murmurs, no gallops, no carotid bruit.  Pulmonary: Effort and  breath sounds normal, no stridor, rhonchi, wheezes, rales.  Abdominal: Soft. BS +,  no distension, tenderness, rebound or guarding.  Musculoskeletal: Normal range of motion. No edema and no tenderness.  Neuro: Alert. CN 2-12 grossly intact. No focal deficits. Skin: Skin is warm and dry. No rash noted. Psychiatric: Normal mood and affect.      LABORATORY PANEL:   CBC  Recent Labs Lab 01/24/17 0456  WBC 4.2  HGB 12.6*  HCT 37.3*  PLT 198   ------------------------------------------------------------------------------------------------------------------  Chemistries   Recent Labs Lab 01/23/17 1917  01/26/17 0410  NA 138  < > 143  K 3.5  < > 3.6  CL 107  < > 111  CO2 20*  < > 22  GLUCOSE 136*  < > 102*  BUN 39*  < > 38*  CREATININE 4.87*  < > 3.72*  CALCIUM 10.5*  < > 11.0*  AST 31  --   --   ALT 24  --   --   ALKPHOS 117  --   --   BILITOT 0.5  --   --   < > = values in this interval not displayed. ------------------------------------------------------------------------------------------------------------------  Cardiac Enzymes No results for input(s): TROPONINI in the last 168 hours. ------------------------------------------------------------------------------------------------------------------  RADIOLOGY:  [place] Renal  Result Date: 01/24/2017 CLINICAL DATA:  Acute renal failure. EXAM: RENAL / URINARY TRACT ULTRASOUND COMPLETE COMPARISON:  None. FINDINGS: Right Kidney: Length: 8.8 cm. Increased echogenicity of renal parenchyma is noted suggesting medical renal disease. 1.5 cm simple cyst is noted in midpole. No mass or hydronephrosis visualized. Left Kidney: Length: 7.6 cm. Increased echogenicity of renal parenchyma is noted suggesting medical renal disease. 1.2 cm simple cyst is noted in midpole. No mass or hydronephrosis visualized. Bladder:  Mild urinary bladder distention is noted. Calculated prevoid volume of 1016 mL was noted. Patient refused to void. IMPRESSION:  Bilateral renal atrophy is noted with increased echogenicity of renal parenchyma consistent with medical renal disease. Bilateral simple renal cysts are noted. Mild urinary bladder distention is noted. Electronically Signed   By: Lupita Raider, M.D.   On: 01/24/2017 15:00     ASSESSMENT AND PLAN:   56 year old male with schizoaffective disorder who is from a group home who was sent by nephrologist due to increasing creatinine.   1. Acute Renal failure on chronic stage IV kidney disease thought to be due to hypovolemia/prerenal azotemia, also due to bladder outlet obstruction, status post Foley catheter placement 01/25/2017, urine output has improved to 4800 over the past 24 hours, now off IV fluids as per nephrologist BMP for a.m. Follow daily output, creatinine in the morning Discussed with nephrologist, Dr. Thedore Mins today  2. Schizoaffective disorder: Continue Seroquel, Risperdal  3. COPD without exacerbation: Continue inhalers  4. Essential hypertension: Continue metoprolol and Norvasc  5. Hyperlipidemia: Continue Zetia and pravastatin  6. Hypernatremia, due to IV fluids, resolved, now off IV fluids  7. Hypokalemia, supplemented intravenously, orally, resolved   8. Hypercalcemia due to primary hyperparathyroidism, intact PTH is pending. The patient was seen by endocrinologist at  [facility], who recommended total thyroidectomy and parathyroidectomy as outpatient, patient refused, I discussed the case with the patient today, recommended to reconsider  9. Urinary retention, continue Flomax, finasteride, likely discontinuation of Foley catheter was in 1 week after initiation of medications versus prior to discharge from the hospital  10. Hematuria, suspected due to Foley trauma, discontinue Lovenox, follow hematuria in the morning, follow hemoglobin level  Management plans discussed with the patient and he is in agreement.  CODE STATUS: full  TOTAL TIME TAKING  CARE OF THIS PATIENT: 35 minutes.  Discussed with Dr. Thedore Mins   POSSIBLE D/C tomorrow, DEPENDING ON CLINICAL CONDITION.   Katharina Caper M.D on 01/26/2017 at 11:58 AM  Between 7am to 6pm - Pager - [phone] After 6pm go to [email or web address] - password [email or web address]  [facility]  Office  [phone]  CC: Primary care physician; Armando Gang, FNP  Note: This dictation was prepared with Dragon dictation along with smaller phrase technology. Any transcriptional errors that result from this process are unintentional.

## 2017-01-27 DIAGNOSIS — E871 Hypo-osmolality and hyponatremia: Secondary | ICD-10-CM

## 2017-01-27 DIAGNOSIS — E876 Hypokalemia: Secondary | ICD-10-CM

## 2017-01-27 DIAGNOSIS — R338 Other retention of urine: Secondary | ICD-10-CM

## 2017-01-27 DIAGNOSIS — R319 Hematuria, unspecified: Secondary | ICD-10-CM

## 2017-01-27 DIAGNOSIS — N179 Acute kidney failure, unspecified: Secondary | ICD-10-CM

## 2017-01-27 LAB — CREATININE, SERUM
Creatinine, Ser: 3.35 mg/dL — ABNORMAL HIGH (ref 0.61–1.24)
GFR calc non Af Amer: 19 mL/min — ABNORMAL LOW (ref >60)
GFR, EST AFRICAN AMERICAN: 23 mL/min — AB (ref >60)

## 2017-01-27 LAB — HEMOGLOBIN: HEMOGLOBIN: 13.2 g/dL (ref 13.0–18.0)

## 2017-01-27 MED ORDER — POLYETHYLENE GLYCOL 3350 17 G PO PACK: 17.0000 g | PACK | Freq: Every day | ORAL | 3 refills | Status: DC

## 2017-01-27 MED ORDER — TAMSULOSIN HCL 0.4 MG PO CAPS: 0.4000 mg | ORAL_CAPSULE | Freq: Every day | ORAL | 3 refills | Status: DC

## 2017-01-27 NOTE — Discharge Summary (Signed)
[facility] - [place] at [facility]   PATIENT NAME: Jason Diaz    MR#:  [id number]  DATE OF BIRTH:  September 05, 1960  DATE OF ADMISSION:  01/23/2017 ADMITTING PHYSICIAN: Enid Baas, MD  DATE OF DISCHARGE: No discharge date for patient encounter.  PRIMARY CARE PHYSICIAN: Armando Gang, FNP     ADMISSION DIAGNOSIS:  Acute Renal Failure  DISCHARGE DIAGNOSIS:  Active Problems:   ARF (acute renal failure) (HCC)   Hypercalcemia   Hematuria   Acute urinary retention   Hyponatremia   Hypokalemia   SECONDARY DIAGNOSIS:   Past Medical History:  Diagnosis Date  . CKD (chronic kidney disease), stage IV (HCC)   . COPD (chronic obstructive pulmonary disease) (HCC)   . GERD (gastroesophageal reflux disease)   . Hypertension   . Multinodular goiter   . Schizoaffective disorder (HCC)     .pro HOSPITAL COURSE:   The patient is a 56 year old African American male with history of COPD, stage IV, COPD, hypercalcemia due to primary hyper parathyroidism, hypertension, multinodular goiter, schizoaffective disorder, who presents to the hospital with complaints of with abnormal creatinine, acute on chronic renal failure, creatinine level of 4.87, estimated GFR of 14. Patient was admitted to the hospital, Lasix was suspended, patient was initiated on IV fluids and his kidney function improved, he was also noted to have acute urinary retention, Foley catheter was placed, draining 1.9 L of urine. Kidney function improved to baseline 3.35 by 01/27/2017. Patient was felt to be stable to be discharged to group home. Unfortunately, he was noted to have hematuria, which was felt to be due to Foley catheter. Lovenox as well as aspirin was stopped, however, patient had mild discoloration of urine. Urology consultation was requested, urine cultures and sensitivities were recommended to be obtained, which was performed. The patient was advised to be sent home on Flomax and  Foley catheter, he was recommended to follow-up with urologist for outpatient trial of voiding. Discussion by problem: 1. Acute Renal failure on chronic stage IV kidney disease thought to be due to hypovolemia/prerenal azotemia, also due to bladder outlet obstruction, status post Foley catheter placement 01/25/2017, urine output improved to 2.55 L over the past 24 hours, now off IV fluids, back on Lasix, follow BMP as outpatient closely, follow up with nephrologist as outpatient  2. Schizoaffective disorder: Continue Seroquel, Risperdal  3. COPD without exacerbation: Continue inhalers  4. Essential hypertension: Continue metoprolol and Norvasc  5. Hyperlipidemia: Continue Zetia and pravastatin  6. Hypernatremia, due to IV fluids, resolved, now off IV fluids  7. Hypokalemia, supplemented intravenously, orally, resolved   8. Hypercalcemia due to primary hyperparathyroidism, intact PTH was found to be high at 89 . The patient was seen by endocrinologist at  [facility], who recommended total thyroidectomy and parathyroidectomy as outpatient, patient refused, patient's mother was recommended to make decisions.  9. Urinary retention, continue Flomax, Foley catheter, follow-up with urologist as outpatient for voiding trial as outpatient  10. Hematuria, suspected due to Foley trauma, now off aspirin and Lovenox, follow hemoglobin level as outpatient, urine cultures and sensitivities were taken, recommended to be followed as outpatient, patient is asymptomatic  DISCHARGE CONDITIONS:   Stable  CONSULTS OBTAINED:  Treatment Team:  Bjorn Pippin, MD  DRUG ALLERGIES:  Not on File  DISCHARGE MEDICATIONS:   Current Discharge Medication List    START taking these medications   Details  polyethylene glycol (MIRALAX / GLYCOLAX) packet Take 17 g by mouth  daily. Qty: 30 each, Refills: 3    tamsulosin (FLOMAX) 0.4 MG CAPS capsule Take 1 capsule (0.4 mg total) by mouth  daily. Qty: 30 capsule, Refills: 3      CONTINUE these medications which have NOT CHANGED   Details  amLODipine (NORVASC) 5 MG tablet Take 5 mg by mouth daily.    Cholecalciferol (VITAMIN D3) 5000 units TABS Take 5,000 Units by mouth daily.    ezetimibe (ZETIA) 10 MG tablet Take 10 mg by mouth daily.    fluPHENAZine (PROLIXIN) 5 MG tablet Take 5 mg by mouth at bedtime as needed.     furosemide (LASIX) 40 MG tablet Take 40 mg by mouth daily.    gemfibrozil (LOPID) 600 MG tablet Take 600 mg by mouth 2 (two) times daily.    metoprolol succinate (TOPROL-XL) 100 MG 24 hr tablet Take 100 mg by mouth daily.    pantoprazole (PROTONIX) 40 MG tablet Take 40 mg by mouth daily.    potassium chloride (K-DUR) 10 MEQ tablet Take 10 mEq by mouth daily.    pravastatin (PRAVACHOL) 40 MG tablet Take 40 mg by mouth daily.    QUEtiapine (SEROQUEL) 400 MG tablet Take 200-400 mg by mouth See admin instructions. tk 0.5 tab qam and 0.5 tab at noon, then take one tab qhs    risperidone (RISPERDAL) 4 MG tablet Take 4 mg by mouth 2 (two) times daily.    Tiotropium Bromide Monohydrate (SPIRIVA RESPIMAT) 2.5 MCG/ACT AERS Inhale 2 puffs into the lungs daily.    traZODone (DESYREL) 150 MG tablet Take 300 mg by mouth at bedtime as needed.       STOP taking these medications     aspirin EC 81 MG tablet          DISCHARGE INSTRUCTIONS:    The patient is to follow-up with his primary care physician nephrologist and urologist as outpatient  If you experience worsening of your admission symptoms, develop shortness of breath, life threatening emergency, suicidal or homicidal thoughts you must seek medical attention immediately by calling 911 or calling your MD immediately  if symptoms less severe.  You Must read complete instructions/literature along with all the possible adverse reactions/side effects for all the Medicines you take and that have been prescribed to you. Take any new Medicines after you  have completely understood and accept all the possible adverse reactions/side effects.   Please note  You were cared for by a hospitalist during your hospital stay. If you have any questions about your discharge medications or the care you received while you were in the hospital after you are discharged, you can call the unit and asked to speak with the hospitalist on call if the hospitalist that took care of you is not available. Once you are discharged, your primary care physician will handle any further medical issues. Please note that NO REFILLS for any discharge medications will be authorized once you are discharged, as it is imperative that you return to your primary care physician (or establish a relationship with a primary care physician if you do not have one) for your aftercare needs so that they can reassess your need for medications and monitor your lab values.    Today   CHIEF COMPLAINT:  No chief complaint on file.   HISTORY OF PRESENT ILLNESS:     VITAL SIGNS:  Blood pressure (!) 145/105, pulse (!) 102, temperature 98.4 F (36.9 C), temperature source Oral, resp. rate 18, height 5\' 5"  (1.651 m), weight  81.7 kg (180 lb 3.2 oz), SpO2 100 %.  I/O:   Intake/Output Summary (Last 24 hours) at 01/27/17 1458 Last data filed at 01/27/17 1300  Gross per 24 hour  Intake              240 ml  Output             1850 ml  Net            -1610 ml    PHYSICAL EXAMINATION:  GENERAL:  56 y.o.-year-old patient lying in the bed with no acute distress.  EYES: Pupils equal, round, reactive to light and accommodation. No scleral icterus. Extraocular muscles intact.  HEENT: Head atraumatic, normocephalic. Oropharynx and nasopharynx clear.  NECK:  Supple, no jugular venous distention. No thyroid enlargement, no tenderness.  LUNGS: Normal breath sounds bilaterally, no wheezing, rales,rhonchi or crepitation. No use of accessory muscles of respiration.  CARDIOVASCULAR: S1, S2 normal. No  murmurs, rubs, or gallops.  ABDOMEN: Soft, non-tender, non-distended. Bowel sounds present. No organomegaly or mass.  EXTREMITIES: No pedal edema, cyanosis, or clubbing.  NEUROLOGIC: Cranial nerves II through XII are intact. Muscle strength 5/5 in all extremities. Sensation intact. Gait not checked.  PSYCHIATRIC: The patient is alert and oriented x 3.  SKIN: No obvious rash, lesion, or ulcer.   DATA REVIEW:   CBC  Recent Labs Lab 01/24/17 0456  WBC 4.2  HGB 12.6*  HCT 37.3*  PLT 198    Chemistries   Recent Labs Lab 01/23/17 1917  01/26/17 0410 01/27/17 0557  NA 138  < > 143  --   K 3.5  < > 3.6  --   CL 107  < > 111  --   CO2 20*  < > 22  --   GLUCOSE 136*  < > 102*  --   BUN 39*  < > 38*  --   CREATININE 4.87*  < > 3.72* 3.35*  CALCIUM 10.5*  < > 11.0*  --   AST 31  --   --   --   ALT 24  --   --   --   ALKPHOS 117  --   --   --   BILITOT 0.5  --   --   --   < > = values in this interval not displayed.  Cardiac Enzymes No results for input(s): TROPONINI in the last 168 hours.  Microbiology Results  Results for orders placed or performed during the hospital encounter of 01/23/17  MRSA PCR Screening     Status: None   Collection Time: 01/23/17  6:16 PM  Result Value Ref Range Status   MRSA by PCR NEGATIVE NEGATIVE Final    Comment:        The GeneXpert MRSA Assay (FDA approved for NASAL specimens only), is one component of a comprehensive MRSA colonization surveillance program. It is not intended to diagnose MRSA infection nor to guide or monitor treatment for MRSA infections.     RADIOLOGY:  No results found.  EKG:  No orders found for this or any previous visit.    Management plans discussed with the patient, family and they are in agreement.  CODE STATUS:     Code Status Orders        Start     Ordered   01/23/17 1854  Full code  Continuous     01/23/17 1853    Code Status History    Date Active Date Inactive Code Status Order  ID  Comments User Context   This patient has a current code status but no historical code status.      TOTAL TIME TAKING CARE OF THIS PATIENT: 40 minutes.    Katharina Caper M.D on 01/27/2017 at 2:58 PM  Between 7am to 6pm - Pager - [phone]  After 6pm go to [email or web address] - password [email or web address] [facility]  [place] [facility]  Office  [phone]  CC: Primary care physician; Armando Gang, FNP

## 2017-01-27 NOTE — [place] FL2 (Signed)
[place] MEDICAID FL2 LEVEL OF CARE SCREENING TOOL     IDENTIFICATION  Patient Name: Jason Diaz Birthdate: [birth year] Sex: male Admission Date (Current Location): 01/23/2017  [place]   Facility and Address:  [facility], [address], [place], [place] [address]      Provider Number: [id number]  Attending Physician Name and Address:  Katharina CaperVaickute, Uchenna Rappaport, MD  Relative Name and Phone Number:       Current Level of Care: Hospital [facility] Prior [facility]val Number:    Date Approved/Denied:   PASRR Number:    Discharge Plan:  [facility](Family Care Home)    Current Diagnoses: Patient Active Problem List   Diagnosis Date Noted  . Hypercalcemia 01/27/2017  . Hematuria 01/27/2017  . Acute urinary retention 01/27/2017  . Hyponatremia 01/27/2017  . Hypokalemia 01/27/2017  . ARF (acute renal failure) (HCC) 01/23/2017    Orientation RESPIRATION BLADDER Height & Weight     Self, Place  Normal Indwelling catheter (Foley Catheter leg bag) Weight: 180 lb 3.2 oz (81.7 kg) Height:  5\' 5"  (165.1 cm)  BEHAVIORAL SYMPTOMS/MOOD NEUROLOGICAL BOWEL NUTRITION STATUS   (none)  (none) Continent Diet (Renal with Fluid restriction)  AMBULATORY STATUS COMMUNICATION OF NEEDS Skin   Independent Verbally Normal                       Personal Care Assistance Level of Assistance              Functional Limitations Info  Sight, Hearing, Speech Sight Info: Adequate Hearing Info: Adequate Speech Info: Adequate    SPECIAL CARE FACTORS FREQUENCY   (dialysis)                    Contractures Contractures Info: Not present    Additional Factors Info  Code Status Code Status Info: Full Code             Current Medications (01/27/2017):  This is the current hospital active medication list Current Facility-Administered Medications  Medication Dose Route Frequency Provider Last Rate Last Dose   . acetaminophen (TYLENOL) tablet 650 mg  650 mg Oral Q6H PRN Enid BaasKalisetti, Radhika, MD       Or  . acetaminophen (TYLENOL) suppository 650 mg  650 mg Rectal Q6H PRN Enid BaasKalisetti, Radhika, MD      . amLODipine (NORVASC) tablet 5 mg  5 mg Oral Daily Enid BaasKalisetti, Radhika, MD   5 mg at 01/27/17 0911  . ezetimibe (ZETIA) tablet 10 mg  10 mg Oral Daily Enid BaasKalisetti, Radhika, MD   10 mg at 01/27/17 0911  . finasteride (PROSCAR) tablet 5 mg  5 mg Oral Daily Katharina CaperVaickute, Corderro Koloski, MD   5 mg at 01/27/17 0911  . metoprolol succinate (TOPROL-XL) 24 hr tablet 100 mg  100 mg Oral Daily Enid BaasKalisetti, Radhika, MD   100 mg at 01/27/17 0911  . metoprolol tartrate (LOPRESSOR) injection 2.5 mg  2.5 mg Intravenous Once Hugelmeyer, Alexis, DO      . ondansetron (ZOFRAN) tablet 4 mg  4 mg Oral Q6H PRN Enid BaasKalisetti, Radhika, MD       Or  . ondansetron (ZOFRAN) injection 4 mg  4 mg Intravenous Q6H PRN Enid BaasKalisetti, Radhika, MD      . pantoprazole (PROTONIX) EC tablet 40 mg  40 mg Oral Daily Enid BaasKalisetti, Radhika, MD   40 mg at 01/27/17 0911  . polyethylene glycol (MIRALAX / GLYCOLAX) packet 17 g  17 g Oral Daily Mosetta Pigeon, MD   17 g at 01/27/17 0910  . pravastatin (PRAVACHOL) tablet 40 mg  40 mg Oral q1800 Enid Baas, MD   40 mg at 01/26/17 1751  . QUEtiapine (SEROQUEL) tablet 200 mg  200 mg Oral BID Enid Baas, MD   200 mg at 01/27/17 1236  . QUEtiapine (SEROQUEL) tablet 400 mg  400 mg Oral QHS Enid Baas, MD   400 mg at 01/26/17 2203  . risperiDONE (RISPERDAL) tablet 4 mg  4 mg Oral QHS Enid Baas, MD   4 mg at 01/26/17 2203  . tamsulosin (FLOMAX) capsule 0.4 mg  0.4 mg Oral Daily Katharina Caper, MD   0.4 mg at 01/27/17 0911  . tiotropium (SPIRIVA) inhalation capsule 18 mcg  18 mcg Inhalation Daily Enid Baas, MD   18 mcg at 01/27/17 6962     Discharge Medications: Please see discharge summary for a list of discharge medications.  Current Discharge Medication List        START taking these  medications   Details  polyethylene glycol (MIRALAX / GLYCOLAX) packet Take 17 g by mouth daily. Qty: 30 each, Refills: 3    tamsulosin (FLOMAX) 0.4 MG CAPS capsule Take 1 capsule (0.4 mg total) by mouth daily. Qty: 30 capsule, Refills: 3          CONTINUE these medications which have NOT CHANGED   Details  amLODipine (NORVASC) 5 MG tablet Take 5 mg by mouth daily.    Cholecalciferol (VITAMIN D3) 5000 units TABS Take 5,000 Units by mouth daily.    ezetimibe (ZETIA) 10 MG tablet Take 10 mg by mouth daily.    fluPHENAZine (PROLIXIN) 5 MG tablet Take 5 mg by mouth at bedtime as needed.     furosemide (LASIX) 40 MG tablet Take 40 mg by mouth daily.    gemfibrozil (LOPID) 600 MG tablet Take 600 mg by mouth 2 (two) times daily.    metoprolol succinate (TOPROL-XL) 100 MG 24 hr tablet Take 100 mg by mouth daily.    pantoprazole (PROTONIX) 40 MG tablet Take 40 mg by mouth daily.    potassium chloride (K-DUR) 10 MEQ tablet Take 10 mEq by mouth daily.    pravastatin (PRAVACHOL) 40 MG tablet Take 40 mg by mouth daily.    QUEtiapine (SEROQUEL) 400 MG tablet Take 200-400 mg by mouth See admin instructions. tk 0.5 tab qam and 0.5 tab at noon, then take one tab qhs    risperidone (RISPERDAL) 4 MG tablet Take 4 mg by mouth 2 (two) times daily.    Tiotropium Bromide Monohydrate (SPIRIVA RESPIMAT) 2.5 MCG/ACT AERS Inhale 2 puffs into the lungs daily.    traZODone (DESYREL) 150 MG tablet Take 300 mg by mouth at bedtime as needed.          STOP taking these medications     aspirin EC 81 MG tablet        Relevant Imaging Results:  Relevant Lab Results:   Additional Information SSN [id number]  Darleene Cleaver, [place]

## 2017-01-27 NOTE — Care Management (Signed)
[facility]  CSW facilitating discharge.  Patient to discharge with foley cath.  RN to educate prior to discharge.  No RNCM needs identified. Patient to follow up with urology outpatient.  RNCM signing off

## 2017-01-27 NOTE — Clinical Social Work Note (Addendum)
CSW attempted to contact group home manager at [phone] Christean LeafVanessa Packingham, CSW left message to inform her that patient is ready for discharge today. CSW awaiting for call back from group home manager.  3:45pm  CSW spoke to group home manager Erie NoeVanessa, and she informed CSW that they can not take patient with a Foley Catheter.  CSW updated physician, and began SNF bed search for patient needing catheter care.  Patient also is a level 2 passar screen, CSW awaiting for passar number for patient to go to SNF.  CSW to continue to follow patient's progress throughout discharge planning.  Ervin KnackEric R. Jamyra Zweig, MSW, Theresia MajorsLCSWA [phone]  01/27/2017 3:25 PM

## 2017-01-27 NOTE — Care Management Important Message (Signed)
Important Message  Patient Details  Name: Gretta ArabMichael D Hoskin MRN: [id number] Date of Birth: 01-01-1961   Medicare Important Message Given:  Yes    Chapman FitchBOWEN, Tinsleigh Slovacek T, RN 01/27/2017, 3:12 PM

## 2017-01-27 NOTE — Progress Notes (Signed)
Dr. Winona LegatoVaickute notified of darker red, bloody urine and clots. MD awaiting urology consult. Will continue to monitor.

## 2017-01-27 NOTE — Consult Note (Signed)
Urology Consult  Referring physician: Seth Bake Reason for referral: Retention and renal failure  Chief Complaint: Retention and renal failure  History of Present Illness: Multiple co-morbidities; stage 4 renal failure; followed by nephrology; foley was placed for 1900 ml; pink urine; Cr improving to 3.3 baseline; u/sound noted renal atrophy and no hydro and one liter in bladder; patient now on flomax this admission  Limited history but patient denies GU surgery and UTI  Flow normally good and ? Frequency  Modifying factors: There are no other modifying factors  Associated signs and symptoms: There are no other associated signs and symptoms Aggravating and relieving factors: There are no other aggravating or relieving factors Severity: Moderate Duration: Persistent  Past Medical History:  Diagnosis Date  . CKD (chronic kidney disease), stage IV ([place])   . COPD (chronic obstructive pulmonary disease) ([place])   . GERD (gastroesophageal reflux disease)   . Hypertension   . Multinodular goiter   . Schizoaffective disorder [facility])    Past Surgical History:  Procedure Laterality Date  . TONSILLECTOMY    . TOOTH EXTRACTION      Medications: I have reviewed the patient's current medications. Allergies: Not on File  Family History  Problem Relation Age of Onset  . Kidney disease Father   . Diabetes Father    Social History:  reports that he has been smoking Cigarettes.  He has been smoking about 0.25 packs per day. He has never used smokeless tobacco. He reports that he does not drink alcohol or use drugs.  ROS: All systems are reviewed and negative except as noted. Rest negative  Physical Exam:  Vital signs in last 24 hours: Temp:  [97.9 F (36.6 C)-98.7 F (37.1 C)] 98.4 F (36.9 C) (06/25 0457) Pulse Rate:  [94-108] 102 (06/25 0457) Resp:  [16-20] 18 (06/25 0457) BP: (138-159)/(95-121) 145/105 (06/25 0457) SpO2:  [97 %-100 %] 100 % (06/25 0457)  Cardiovascular: Skin  warm; not flushed Respiratory: Breaths quiet; no shortness of breath Abdomen: No masses Neurological: Normal sensation to touch Musculoskeletal: Normal motor function arms and legs Lymphatics: No inguinal adenopathy Skin: No rashes Genitourinary:foley and normal genitalia  Laboratory Data:  Results for orders placed or performed during the hospital encounter of 01/23/17 (from the past 72 hour(s))  Renal function panel     Status: Abnormal   Collection Time: 01/25/17  3:33 AM  Result Value Ref Range   Sodium 146 (H) 135 - 145 mmol/L   Potassium 3.4 (L) 3.5 - 5.1 mmol/L   Chloride 114 (H) 101 - 111 mmol/L   CO2 23 22 - 32 mmol/L   Glucose, Bld 114 (H) 65 - 99 mg/dL   BUN 36 (H) 6 - 20 mg/dL   Creatinine, Ser 4.30 (H) 0.61 - 1.24 mg/dL   Calcium 10.6 (H) 8.9 - 10.3 mg/dL   Phosphorus 3.7 2.5 - 4.6 mg/dL   Albumin 3.7 3.5 - 5.0 g/dL   GFR calc non Af Amer 14 (L) >60 mL/min   GFR calc Af Amer 17 (L) >60 mL/min    Comment: (NOTE) The eGFR has been calculated using the CKD EPI equation. This calculation has not been validated in all clinical situations. eGFR's persistently <60 mL/min signify possible Chronic Kidney Disease.    Anion gap 9 5 - 15  Basic metabolic panel     Status: Abnormal   Collection Time: 01/25/17  4:10 PM  Result Value Ref Range   Sodium 142 135 - 145 mmol/L   Potassium 3.7  3.5 - 5.1 mmol/L   Chloride 109 101 - 111 mmol/L   CO2 22 22 - 32 mmol/L   Glucose, Bld 116 (H) 65 - 99 mg/dL   BUN 37 (H) 6 - 20 mg/dL   Creatinine, Ser 3.89 (H) 0.61 - 1.24 mg/dL   Calcium 10.8 (H) 8.9 - 10.3 mg/dL   GFR calc non Af Amer 16 (L) >60 mL/min   GFR calc Af Amer 19 (L) >60 mL/min    Comment: (NOTE) The eGFR has been calculated using the CKD EPI equation. This calculation has not been validated in all clinical situations. eGFR's persistently <60 mL/min signify possible Chronic Kidney Disease.    Anion gap 11 5 - 15  Parathyroid hormone, intact (no Ca)     Status:  Abnormal   Collection Time: 01/25/17  4:10 PM  Result Value Ref Range   PTH 89 (H) 15 - 65 pg/mL    Comment: (NOTE) Performed At: [facility] [place], [place] [id number] Lindon Romp MD [id number]   Renal function panel     Status: Abnormal   Collection Time: 01/26/17  4:10 AM  Result Value Ref Range   Sodium 143 135 - 145 mmol/L   Potassium 3.6 3.5 - 5.1 mmol/L   Chloride 111 101 - 111 mmol/L   CO2 22 22 - 32 mmol/L   Glucose, Bld 102 (H) 65 - 99 mg/dL   BUN 38 (H) 6 - 20 mg/dL   Creatinine, Ser 3.72 (H) 0.61 - 1.24 mg/dL   Calcium 11.0 (H) 8.9 - 10.3 mg/dL   Phosphorus 3.6 2.5 - 4.6 mg/dL   Albumin 4.0 3.5 - 5.0 g/dL   GFR calc non Af Amer 17 (L) >60 mL/min   GFR calc Af Amer 20 (L) >60 mL/min    Comment: (NOTE) The eGFR has been calculated using the CKD EPI equation. This calculation has not been validated in all clinical situations. eGFR's persistently <60 mL/min signify possible Chronic Kidney Disease.    Anion gap 10 5 - 15  Creatinine, serum     Status: Abnormal   Collection Time: 01/27/17  5:57 AM  Result Value Ref Range   Creatinine, Ser 3.35 (H) 0.61 - 1.24 mg/dL   GFR calc non Af Amer 19 (L) >60 mL/min   GFR calc Af Amer 23 (L) >60 mL/min    Comment: (NOTE) The eGFR has been calculated using the CKD EPI equation. This calculation has not been validated in all clinical situations. eGFR's persistently <60 mL/min signify possible Chronic Kidney Disease.    Recent Results (from the past 240 hour(s))  MRSA PCR Screening     Status: None   Collection Time: 01/23/17  6:16 PM  Result Value Ref Range Status   MRSA by PCR NEGATIVE NEGATIVE Final    Comment:        The GeneXpert MRSA Assay (FDA approved for NASAL specimens only), is one component of a comprehensive MRSA colonization surveillance program. It is not intended to diagnose MRSA infection nor to guide or monitor treatment for MRSA infections.     Creatinine:  Recent Labs  01/23/17 1917 01/24/17 0456 01/25/17 0333 01/25/17 1610 01/26/17 0410 01/27/17 0557  CREATININE 4.87* 4.57* 4.30* 3.89* 3.72* 3.35*    Xrays: See report/chart See above  Impression/Assessment:  Acute on ? Chronic retention; no hydro  Plan:  Send home on flomax and foley See Urology as outpt for trial of voiding Send urine for c/s No intervention for  mild blood in urine  Jason Diaz A 01/27/2017, 12:03 PM

## 2017-01-27 NOTE — Progress Notes (Signed)
[facility], [place] 01/27/17  Subjective:  Creatinine trending down slowly. Creatinine currently 3.3 Blood noted in Foley bag. Serum calcium was 11.0 yesterday.   Objective:  Vital signs in last 24 hours:  Temp:  [97.9 F (36.6 C)-98.7 F (37.1 C)] 98.4 F (36.9 C) (06/25 0457) Pulse Rate:  [94-108] 102 (06/25 0457) Resp:  [16-20] 18 (06/25 0457) BP: (138-159)/(95-121) 145/105 (06/25 0457) SpO2:  [97 %-100 %] 100 % (06/25 0457)  Weight change:  Filed Weights   01/23/17 1743  Weight: 81.7 kg (180 lb 3.2 oz)    Intake/Output:    Intake/Output Summary (Last 24 hours) at 01/27/17 1105 Last data filed at 01/27/17 0456  Gross per 24 hour  Intake              240 ml  Output             2550 ml  Net            -2310 ml     Physical Exam: General: NAD, laying in bed  HEENT Anicteric, moist oral mucus membranes  Neck supple  Pulm/lungs Clear b/l, normal effort   CVS/Heart Regular; no rub  Abdomen:  Soft, distended, Bowel sounds present   Extremities: Trace edema  Neurologic: Alert; able to answer simple questions  Skin: No acute rashes          Basic Metabolic Panel:   Recent Labs Lab 01/23/17 1917 01/24/17 0456 01/25/17 0333 01/25/17 1610 01/26/17 0410 01/27/17 0557  NA 138 142 146* 142 143  --   K 3.5 3.3* 3.4* 3.7 3.6  --   CL 107 111 114* 109 111  --   CO2 20* 22 23 22 22   --   GLUCOSE 136* 129* 114* 116* 102*  --   BUN 39* 38* 36* 37* 38*  --   CREATININE 4.87* 4.57* 4.30* 3.89* 3.72* 3.35*  CALCIUM 10.5* 10.7* 10.6* 10.8* 11.0*  --   PHOS  --   --  3.7  --  3.6  --      CBC:  Recent Labs Lab 01/23/17 1917 01/24/17 0456  WBC 4.3 4.2  HGB 12.5* 12.6*  HCT 37.1* 37.3*  MCV 90.0 88.0  PLT 187 198     No results found for: HEPBSAG, HEPBSAB, HEPBIGM    Microbiology:  Recent Results (from the past 240 hour(s))  MRSA PCR Screening     Status: None   Collection Time: 01/23/17  6:16 PM  Result Value Ref  Range Status   MRSA by PCR NEGATIVE NEGATIVE Final    Comment:        The GeneXpert MRSA Assay (FDA approved for NASAL specimens only), is one component of a comprehensive MRSA colonization surveillance program. It is not intended to diagnose MRSA infection nor to guide or monitor treatment for MRSA infections.     Coagulation Studies: No results for input(s): LABPROT, INR in the last 72 hours.  Urinalysis: No results for input(s): COLORURINE, LABSPEC, PHURINE, GLUCOSEU, HGBUR, BILIRUBINUR, KETONESUR, PROTEINUR, UROBILINOGEN, NITRITE, LEUKOCYTESUR in the last 72 hours.  Invalid input(s): APPERANCEUR    Imaging: No results found.   Medications:    . amLODipine  5 mg Oral Daily  . ezetimibe  10 mg Oral Daily  . finasteride  5 mg Oral Daily  . metoprolol succinate  100 mg Oral Daily  . metoprolol tartrate  2.5 mg Intravenous Once  . pantoprazole  40 mg Oral Daily  . polyethylene glycol  17 g Oral Daily  . pravastatin  40 mg Oral q1800  . QUEtiapine  200 mg Oral BID  . QUEtiapine  400 mg Oral QHS  . risperiDONE  4 mg Oral QHS  . tamsulosin  0.4 mg Oral Daily  . tiotropium  18 mcg Inhalation Daily   acetaminophen **OR** acetaminophen, ondansetron **OR** ondansetron (ZOFRAN) IV  Assessment/ Plan:  56 y.o. AA male with COPD, HTN, Multinodular goiter, Schizoaffective disorder, CKD st 4, Primary hyperparathyroidism with hypercalcemia admitted for ARF  1. ARF on CKD st 4 Baseline Cr 3.39/GFR 23 from March 2018 Admit Cr 4.87  -  Creatinine has trended back down to 3.3 which is his baseline. Continue to monitor renal function trend while here.  2. Hypercalcemia with primary hyperparathyroidism Most recent serum calcium was 11.0. Thyroidectomy and parathyroidectomy have been considered as an outpatient previously. Continue to monitor serum calcium.  3. Hypokalemia and hypernatremia - Serum potassium was up to 3.6 yesterday and sodium was normal at 143. Continue to  periodically monitor.   LOS: 4 Jason Diaz 6/25/201811:05 AM  [facility] [place], [place] [phone]

## 2017-01-27 NOTE — [place] FL2 (Signed)
[place] MEDICAID FL2 LEVEL OF CARE SCREENING TOOL     IDENTIFICATION  Patient Name: Jason Diaz Birthdate: May 08, 1961 Sex: male Admission Date (Current Location): 01/23/2017  [place] and [place] Number:  Chiropodist and Address:  [facility], [address], [place], [place] [address]      Provider Number: [id number]  Attending Physician Name and Address:  Katharina Caper, MD  Relative Name and Phone Number:       Current Level of Care: Hospital Recommended Level of Care: Skilled Nursing Facility Prior Approval Number:    Date Approved/Denied:   PASRR Number: [id number]  Discharge Plan: SNF    Current Diagnoses: Patient Active Problem List   Diagnosis Date Noted  . Hypercalcemia 01/27/2017  . Hematuria 01/27/2017  . Acute urinary retention 01/27/2017  . Hyponatremia 01/27/2017  . Hypokalemia 01/27/2017  . ARF (acute renal failure) (HCC) 01/23/2017    Orientation RESPIRATION BLADDER Height & Weight     Self, Place  Normal Indwelling catheter (Foley Catheter leg bag) Weight: 180 lb 3.2 oz (81.7 kg) Height:  5\' 5"  (165.1 cm)  BEHAVIORAL SYMPTOMS/MOOD NEUROLOGICAL BOWEL NUTRITION STATUS   (none)  (none) Continent Diet (Renal Fluid Restriction)  AMBULATORY STATUS COMMUNICATION OF NEEDS Skin   Independent Verbally Normal                       Personal Care Assistance Level of Assistance  Bathing, Feeding, Dressing Bathing Assistance: Limited assistance Feeding assistance: Independent Dressing Assistance: Limited assistance     Functional Limitations Info  Sight, Hearing, Speech Sight Info: Adequate Hearing Info: Adequate Speech Info: Adequate    SPECIAL CARE FACTORS FREQUENCY   (dialysis)                    Contractures Contractures Info: Not present    Additional Factors Info  Code Status, Psychotropic Code Status Info: Full Code   Psychotropic Info: QUEtiapine (SEROQUEL) tablet 200 mg and  QUEtiapine (SEROQUEL) tablet 400 mg and risperiDONE (RISPERDAL) tablet 4 mg         Current Medications (01/27/2017):  This is the current hospital active medication list Current Facility-Administered Medications  Medication Dose Route Frequency Provider Last Rate Last Dose  . acetaminophen (TYLENOL) tablet 650 mg  650 mg Oral Q6H PRN Enid Baas, MD       Or  . acetaminophen (TYLENOL) suppository 650 mg  650 mg Rectal Q6H PRN Enid Baas, MD      . amLODipine (NORVASC) tablet 5 mg  5 mg Oral Daily Enid Baas, MD   5 mg at 01/27/17 0911  . ezetimibe (ZETIA) tablet 10 mg  10 mg Oral Daily Enid Baas, MD   10 mg at 01/27/17 0911  . finasteride (PROSCAR) tablet 5 mg  5 mg Oral Daily Katharina Caper, MD   5 mg at 01/27/17 0911  . metoprolol succinate (TOPROL-XL) 24 hr tablet 100 mg  100 mg Oral Daily Enid Baas, MD   100 mg at 01/27/17 0911  . metoprolol tartrate (LOPRESSOR) injection 2.5 mg  2.5 mg Intravenous Once Hugelmeyer, Alexis, DO      . ondansetron (ZOFRAN) tablet 4 mg  4 mg Oral Q6H PRN Enid Baas, MD       Or  . ondansetron (ZOFRAN) injection 4 mg  4 mg Intravenous Q6H PRN Enid Baas, MD      . pantoprazole (PROTONIX) EC tablet 40 mg  40 mg Oral Daily [place], Radhika,  MD   40 mg at 01/27/17 0911  . polyethylene glycol (MIRALAX / GLYCOLAX) packet 17 g  17 g Oral Daily Mosetta PigeonSingh, Harmeet, MD   17 g at 01/27/17 0910  . pravastatin (PRAVACHOL) tablet 40 mg  40 mg Oral q1800 Enid BaasKalisetti, Radhika, MD   40 mg at 01/26/17 1751  . QUEtiapine (SEROQUEL) tablet 200 mg  200 mg Oral BID Enid BaasKalisetti, Radhika, MD   200 mg at 01/27/17 1236  . QUEtiapine (SEROQUEL) tablet 400 mg  400 mg Oral QHS Enid BaasKalisetti, Radhika, MD   400 mg at 01/26/17 2203  . risperiDONE (RISPERDAL) tablet 4 mg  4 mg Oral QHS Enid BaasKalisetti, Radhika, MD   4 mg at 01/26/17 2203  . tamsulosin (FLOMAX) capsule 0.4 mg  0.4 mg Oral Daily Katharina CaperVaickute, Grainne Knights, MD   0.4 mg at 01/27/17 0911  .  tiotropium (SPIRIVA) inhalation capsule 18 mcg  18 mcg Inhalation Daily Enid BaasKalisetti, Radhika, MD   18 mcg at 01/27/17 [id number]     Discharge Medications: Please see discharge summary for a list of discharge medications.  Relevant Imaging Results:  Relevant Lab Results:   Additional Information SSN [id number]  Darleene Cleavernterhaus, Eric R, [place]

## 2017-01-28 LAB — URINE CULTURE: CULTURE: NO GROWTH

## 2017-01-28 NOTE — Progress Notes (Signed)
01/28/2017   16:30  Jason ArabMichael D Diaz to be D/C'd Skilled nursing facility per MD order.  Discussed prescriptions and follow up appointments with the patient. Prescriptions given to patient, medication list explained in detail. Pt verbalized understanding.  Allergies as of 01/28/2017   Not on File     Medication List    STOP taking these medications   aspirin EC 81 MG tablet     TAKE these medications   amLODipine 5 MG tablet Commonly known as:  NORVASC Take 5 mg by mouth daily.   ezetimibe 10 MG tablet Commonly known as:  ZETIA Take 10 mg by mouth daily.   fluPHENAZine 5 MG tablet Commonly known as:  PROLIXIN Take 5 mg by mouth at bedtime as needed.   furosemide 40 MG tablet Commonly known as:  LASIX Take 40 mg by mouth daily.   gemfibrozil 600 MG tablet Commonly known as:  LOPID Take 600 mg by mouth 2 (two) times daily.   metoprolol succinate 100 MG 24 hr tablet Commonly known as:  TOPROL-XL Take 100 mg by mouth daily.   pantoprazole 40 MG tablet Commonly known as:  PROTONIX Take 40 mg by mouth daily.   polyethylene glycol packet Commonly known as:  MIRALAX / GLYCOLAX Take 17 g by mouth daily.   potassium chloride 10 MEQ tablet Commonly known as:  K-DUR Take 10 mEq by mouth daily.   pravastatin 40 MG tablet Commonly known as:  PRAVACHOL Take 40 mg by mouth daily.   QUEtiapine 400 MG tablet Commonly known as:  SEROQUEL Take 200-400 mg by mouth See admin instructions. tk 0.5 tab qam and 0.5 tab at noon, then take one tab qhs   risperidone 4 MG tablet Commonly known as:  RISPERDAL Take 4 mg by mouth 2 (two) times daily.   SPIRIVA RESPIMAT 2.5 MCG/ACT Aers Generic drug:  Tiotropium Bromide Monohydrate Inhale 2 puffs into the lungs daily.   tamsulosin 0.4 MG Caps capsule Commonly known as:  FLOMAX Take 1 capsule (0.4 mg total) by mouth daily.   traZODone 150 MG tablet Commonly known as:  DESYREL Take 300 mg by mouth at bedtime as needed.   Vitamin  D3 5000 units Tabs Take 5,000 Units by mouth daily.       Vitals:   01/28/17 0434 01/28/17 1352  BP: (!) 140/103 129/90  Pulse: (!) 103 (!) 106  Resp: 18   Temp: 98.2 F (36.8 C) 97.6 F (36.4 C)    Skin clean, dry and intact without evidence of skin break down, no evidence of skin tears noted. IV catheter discontinued intact. Site without signs and symptoms of complications. Dressing and pressure applied. Pt denies pain at this time. No complaints noted.  An After Visit Summary was printed and given to the patient. Patient escorted and D/C to SNF via EMS.  Jason Diaz, Jason Diaz E

## 2017-01-28 NOTE — Progress Notes (Addendum)
LCSW following for disposition of needs:  New SNF Placement as [facility].  Call placed to patient mother regarding SNF bed choice who [facility]. [facility]. All documents sent to facility for review. Call placed to facility in effort to notify of bed choice and message left.  Awaiting call back to ensure bed at facility today. Patient will transport by EMS and LCSW will arrange.  Will follow up with discharge once call returned.  Completed. Patient stable for discharge and able to transition to SNF.  Bed:  35B  Report:  [phone]  [place] obtained:  [phone] Esmeralda LinksE  Kampbell Holaway LCSW, MSW Clinical Social Work: Optician, dispensingystem Wide Float Coverage for :  [phone]

## 2017-01-28 NOTE — Discharge Summary (Addendum)
[facility] [place] [facility]   PATIENT NAME: Jason Diaz    MR#:  [id number]  DATE OF BIRTH:  [birth year]  DATE OF ADMISSION:  01/23/2017 ADMITTING PHYSICIAN: Enid Baas, MD  DATE OF DISCHARGE: No discharge date for patient encounter.  PRIMARY CARE PHYSICIAN: Armando Gang, FNP     ADMISSION DIAGNOSIS:  Acute Renal Failure  DISCHARGE DIAGNOSIS:  Active Problems:   ARF (acute renal failure) (HCC)   Hypercalcemia   Hematuria   Acute urinary retention   Hyponatremia   Hypokalemia   SECONDARY DIAGNOSIS:   Past Medical History:  Diagnosis Date  . CKD (chronic kidney disease), stage IV (HCC)   . COPD (chronic obstructive pulmonary disease) (HCC)   . GERD (gastroesophageal reflux disease)   . Hypertension   . Multinodular goiter   . Schizoaffective disorder (HCC)     .pro HOSPITAL COURSE:   The patient is a 56 year old African American male with history of COPD, stage IV, COPD, hypercalcemia due to primary hyper parathyroidism, hypertension, multinodular goiter, schizoaffective disorder, who presents to the hospital with complaints of with abnormal creatinine, acute on chronic renal failure, creatinine level of 4.87, estimated GFR of 14. Patient was admitted to the hospital, Lasix was suspended, patient was initiated on IV fluids and his kidney function improved, he was also noted to have acute urinary retention, Foley catheter was placed, draining 1.9 L of urine. Kidney function improved to baseline 3.35 by 01/27/2017. Patient was felt to be stable to be discharged to group home. Unfortunately, he was noted to have hematuria, which was felt to be due to Foley catheter. Lovenox as well as aspirin was stopped, however, patient had mild discoloration of urine. Urology consultation was requested, urine cultures and sensitivities were recommended to be obtained, which was performed. The patient was advised to be sent home on Flomax and  Foley catheter, he was recommended to follow-up with urologist for outpatient trial of voiding. Unfortunately, patient was not discharged to group home since patient's Foley catheter could not have been taken care of by group home staff. Skilled nursing facility placement was recommended and the bed became available today. Patient is stable to be discharged to skilled nursing facility for rehabilitation. Today Discussion by problem: 1. Acute Renal failure on chronic stage IV kidney disease thought to be due to hypovolemia/prerenal azotemia, also due to bladder outlet obstruction, status post Foley catheter placement 01/25/2017, urine output improved to 2.2 L over the past 24 hours, now off IV fluids, resume Lasix, follow BMP as outpatient closely, follow up with nephrologist as outpatient. It is unclear why patient is on Lasix, suspected due to peripheral swelling, patient may need to have his Lasix dose changed to  every second day or as needed, however, he is to follow-up with nephrologist and make those changes.   2. Schizoaffective disorder: Continue Seroquel, Risperdal  3. COPD without exacerbation: Continue inhalers  4. Essential hypertension: Continue metoprolol and Norvasc  5. Hyperlipidemia: Continue Zetia and pravastatin  6. Hypernatremia, due to IV fluids, resolved, now off IV fluids  7. Hypokalemia, supplemented intravenously, orally, resolved   8. Hypercalcemia due to primary hyperparathyroidism, intact PTH was found to be high at 89 . The patient was seen by endocrinologist at  [facility], who recommended total thyroidectomy and parathyroidectomy as outpatient, patient refused, patient's mother was recommended to make decisions.  9. Urinary retention, continue Flomax, Foley catheter, follow-up with urologist as outpatient for voiding trial  as outpatient  10. Hematuria, suspected due to Foley trauma, now off aspirin and Lovenox, follow hemoglobin level as  outpatient, it was 13.2 on  01/23/2017 , urine cultures and sensitivities were taken, recommend to be followed as outpatient, patient is asymptomatic  DISCHARGE CONDITIONS:   Stable  CONSULTS OBTAINED:  Treatment Team:  Bjorn Pippin, MD  DRUG ALLERGIES:  Not on File  DISCHARGE MEDICATIONS:   Current Discharge Medication List    START taking these medications   Details  polyethylene glycol (MIRALAX / GLYCOLAX) packet Take 17 g by mouth daily. Qty: 30 each, Refills: 3    tamsulosin (FLOMAX) 0.4 MG CAPS capsule Take 1 capsule (0.4 mg total) by mouth daily. Qty: 30 capsule, Refills: 3      CONTINUE these medications which have NOT CHANGED   Details  amLODipine (NORVASC) 5 MG tablet Take 5 mg by mouth daily.    Cholecalciferol (VITAMIN D3) 5000 units TABS Take 5,000 Units by mouth daily.    ezetimibe (ZETIA) 10 MG tablet Take 10 mg by mouth daily.    fluPHENAZine (PROLIXIN) 5 MG tablet Take 5 mg by mouth at bedtime as needed.     furosemide (LASIX) 40 MG tablet Take 40 mg by mouth daily.    gemfibrozil (LOPID) 600 MG tablet Take 600 mg by mouth 2 (two) times daily.    metoprolol succinate (TOPROL-XL) 100 MG 24 hr tablet Take 100 mg by mouth daily.    pantoprazole (PROTONIX) 40 MG tablet Take 40 mg by mouth daily.    potassium chloride (K-DUR) 10 MEQ tablet Take 10 mEq by mouth daily.    pravastatin (PRAVACHOL) 40 MG tablet Take 40 mg by mouth daily.    QUEtiapine (SEROQUEL) 400 MG tablet Take 200-400 mg by mouth See admin instructions. tk 0.5 tab qam and 0.5 tab at noon, then take one tab qhs    risperidone (RISPERDAL) 4 MG tablet Take 4 mg by mouth 2 (two) times daily.    Tiotropium Bromide Monohydrate (SPIRIVA RESPIMAT) 2.5 MCG/ACT AERS Inhale 2 puffs into the lungs daily.    traZODone (DESYREL) 150 MG tablet Take 300 mg by mouth at bedtime as needed.       STOP taking these medications     aspirin EC 81 MG tablet          DISCHARGE INSTRUCTIONS:     The patient is to follow-up with his primary care physician nephrologist and urologist as outpatient  If you experience worsening of your admission symptoms, develop shortness of breath, life threatening emergency, suicidal or homicidal thoughts you must seek medical attention immediately by calling 911 or calling your MD immediately  if symptoms less severe.  You Must read complete instructions/literature along with all the possible adverse reactions/side effects for all the Medicines you take and that have been prescribed to you. Take any new Medicines after you have completely understood and accept all the possible adverse reactions/side effects.   Please note  You were cared for by a hospitalist during your hospital stay. If you have any questions about your discharge medications or the care you received while you were in the hospital after you are discharged, you can call the unit and asked to speak with the hospitalist on call if the hospitalist that took care of you is not available. Once you are discharged, your primary care physician will handle any further medical issues. Please note that NO REFILLS for any discharge medications will be authorized once you are discharged,  as it is imperative that you return to your primary care physician (or establish a relationship with a primary care physician if you do not have one) for your aftercare needs so that they can reassess your need for medications and monitor your lab values.    Today   CHIEF COMPLAINT:  No chief complaint on file.   HISTORY OF PRESENT ILLNESS:     VITAL SIGNS:  Blood pressure (!) 140/103, pulse (!) 103, temperature 98.2 F (36.8 C), temperature source Oral, resp. rate 18, height 5\' 5"  (1.651 m), weight 81.7 kg (180 lb 3.2 oz), SpO2 100 %.  I/O:    Intake/Output Summary (Last 24 hours) at 01/28/17 1252 Last data filed at 01/28/17 0950  Gross per 24 hour  Intake              540 ml  Output              2450 ml  Net            -1910 ml    PHYSICAL EXAMINATION:  GENERAL:  56 y.o.-year-old patient lying in the bed with no acute distress.  EYES: Pupils equal, round, reactive to light and accommodation. No scleral icterus. Extraocular muscles intact.  HEENT: Head atraumatic, normocephalic. Oropharynx and nasopharynx clear.  NECK:  Supple, no jugular venous distention. No thyroid enlargement, no tenderness.  LUNGS: Normal breath sounds bilaterally, no wheezing, rales,rhonchi or crepitation. No use of accessory muscles of respiration.  CARDIOVASCULAR: S1, S2 normal. No murmurs, rubs, or gallops.  ABDOMEN: Soft, non-tender, non-distended. Bowel sounds present. No organomegaly or mass.  EXTREMITIES: No pedal edema, cyanosis, or clubbing. Bloody urine in the Foley catheter bag NEUROLOGIC: Cranial nerves II through XII are intact. Muscle strength 5/5 in all extremities. Sensation intact. Gait not checked.  PSYCHIATRIC: The patient is alert and oriented x 3.  SKIN: No obvious rash, lesion, or ulcer.   DATA REVIEW:   CBC  Recent Labs Lab 01/24/17 0456 01/27/17 0557  WBC 4.2  --   HGB 12.6* 13.2  HCT 37.3*  --   PLT 198  --     Chemistries   Recent Labs Lab 01/23/17 1917  01/26/17 0410 01/27/17 0557  NA 138  < > 143  --   K 3.5  < > 3.6  --   CL 107  < > 111  --   CO2 20*  < > 22  --   GLUCOSE 136*  < > 102*  --   BUN 39*  < > 38*  --   CREATININE 4.87*  < > 3.72* 3.35*  CALCIUM 10.5*  < > 11.0*  --   AST 31  --   --   --   ALT 24  --   --   --   ALKPHOS 117  --   --   --   BILITOT 0.5  --   --   --   < > = values in this interval not displayed.  Cardiac Enzymes No results for input(s): TROPONINI in the last 168 hours.  Microbiology Results  Results for orders placed or performed during the hospital encounter of 01/23/17  MRSA PCR Screening     Status: None   Collection Time: 01/23/17  6:16 PM  Result Value Ref Range Status   MRSA by PCR NEGATIVE NEGATIVE Final     Comment:        The GeneXpert MRSA Assay (FDA approved for NASAL specimens only), is  one component of a comprehensive MRSA colonization surveillance program. It is not intended to diagnose MRSA infection nor to guide or monitor treatment for MRSA infections.     RADIOLOGY:  No results found.  EKG:  No orders found for this or any previous visit.    Management plans discussed with the patient, family and they are in agreement.  CODE STATUS:     Code Status Orders        Start     Ordered   01/23/17 1854  Full code  Continuous     01/23/17 1853    Code Status History    Date Active Date Inactive Code Status Order ID Comments User Context   This patient has a current code status but no historical code status.      TOTAL TIME TAKING CARE OF THIS PATIENT: 35 minutes.    Katharina Caper M.D on 01/28/2017 at 12:52 PM  Between 7am to 6pm - Pager - [phone]  After 6pm go to [email or web address] - password [email or web address] [facility]  [facility]  Office  [phone]  CC: Primary care physician; Armando Gang, FNP

## 2017-01-28 NOTE — Clinical Social Work Placement (Signed)
   CLINICAL SOCIAL WORK PLACEMENT  NOTE  Date:  01/28/2017  Patient Details  Name: Jason ArabMichael D Raisanen MRN: [id number] Date of Birth: [birth year]  Clinical Social Work is seeking post-discharge placement for this patient at the Skilled  Nursing Facility level of care (*CSW will initial, date and re-position this form in  chart as items are completed):  Yes   Patient/family provided with [facility] Social Work Department's list of facilities offering this level of care within the geographic area requested by the patient (or if unable, by the patient's family).  Yes   Patient/family informed of their freedom to choose among providers that offer the needed level of care, that participate in Medicare, Medicaid or managed care program needed by the patient, have an available bed and are willing to accept the patient.  Yes   Patient/family informed of [facility]'s ownership interest in [facility] and [facility], as well as of the fact that they are under no obligation to receive care at these facilities.  PASRR submitted to EDS on 01/28/17     PASRR number received on 01/28/17     Existing PASRR number confirmed on       FL2 transmitted to all facilities in geographic area requested by pt/family on 01/28/17     FL2 transmitted to all facilities within larger geographic area on       Patient informed that his/her managed care company has contracts with or will negotiate with certain facilities, including the following:        Yes   Patient/family informed of bed offers received.  Patient chooses bed at [facility]     Physician recommends and patient chooses bed at [facility]    Patient to be [facility] on 01/28/17.  Patient to be transferred to facility by EMS     Patient family notified on 01/28/17 of transfer.  Name of family member notified:  Mother and FCH: Vanessa     PHYSICIAN Please sign FL2     Additional  Comment:    _______________________________________________ Raye Sorrowoble, Lottie Siska N, LCSW 01/28/2017, 11:13 AM

## 2017-01-28 NOTE — Progress Notes (Signed)
[facility] [place], [place] 01/28/17  Subjective:  Patient seen at bedside. No new renal function testing available today. Creatinine yesterday was 3.3. Patient in good spirits today.   Objective:  Vital signs in last 24 hours:  Temp:  [98 F (36.7 C)-98.2 F (36.8 C)] 98.2 F (36.8 C) (06/26 0434) Pulse Rate:  [103-110] 103 (06/26 0434) Resp:  [16-18] 18 (06/26 0434) BP: (132-140)/(91-103) 140/103 (06/26 0434) SpO2:  [100 %] 100 % (06/26 0434)  Weight change:  Filed Weights   01/23/17 1743  Weight: 81.7 kg (180 lb 3.2 oz)    Intake/Output:    Intake/Output Summary (Last 24 hours) at 01/28/17 1101 Last data filed at 01/28/17 0950  Gross per 24 hour  Intake              540 ml  Output             2450 ml  Net            -1910 ml     Physical Exam: General: NAD, laying in bed  HEENT Anicteric, moist oral mucus membranes  Neck supple  Pulm/lungs Clear b/l, normal effort   CVS/Heart Regular; no rub  Abdomen:  Soft, distended, Bowel sounds present   Extremities: Trace edema  Neurologic: Alert; able to answer simple questions  Skin: No acute rashes          Basic Metabolic Panel:   Recent Labs Lab 01/23/17 1917 01/24/17 0456 01/25/17 0333 01/25/17 1610 01/26/17 0410 01/27/17 0557  NA 138 142 146* 142 143  --   K 3.5 3.3* 3.4* 3.7 3.6  --   CL 107 111 114* 109 111  --   CO2 20* 22 23 22 22   --   GLUCOSE 136* 129* 114* 116* 102*  --   BUN 39* 38* 36* 37* 38*  --   CREATININE 4.87* 4.57* 4.30* 3.89* 3.72* 3.35*  CALCIUM 10.5* 10.7* 10.6* 10.8* 11.0*  --   PHOS  --   --  3.7  --  3.6  --      CBC:  Recent Labs Lab 01/23/17 1917 01/24/17 0456 01/27/17 0557  WBC 4.3 4.2  --   HGB 12.5* 12.6* 13.2  HCT 37.1* 37.3*  --   MCV 90.0 88.0  --   PLT 187 198  --      No results found for: HEPBSAG, HEPBSAB, HEPBIGM    Microbiology:  Recent Results (from the past 240 hour(s))  MRSA PCR Screening     Status: None   Collection Time: 01/23/17  6:16 PM  Result Value Ref Range Status   MRSA by PCR NEGATIVE NEGATIVE Final    Comment:        The GeneXpert MRSA Assay (FDA approved for NASAL specimens only), is one component of a comprehensive MRSA colonization surveillance program. It is not intended to diagnose MRSA infection nor to guide or monitor treatment for MRSA infections.     Coagulation Studies: No results for input(s): LABPROT, INR in the last 72 hours.  Urinalysis: No results for input(s): COLORURINE, LABSPEC, PHURINE, GLUCOSEU, HGBUR, BILIRUBINUR, KETONESUR, PROTEINUR, UROBILINOGEN, NITRITE, LEUKOCYTESUR in the last 72 hours.  Invalid input(s): APPERANCEUR    Imaging: No results found.   Medications:    . amLODipine  5 mg Oral Daily  . ezetimibe  10 mg Oral Daily  . finasteride  5 mg Oral Daily  . metoprolol succinate  100 mg Oral Daily  . metoprolol tartrate  2.5 mg Intravenous Once  . pantoprazole  40 mg Oral Daily  . polyethylene glycol  17 g Oral Daily  . pravastatin  40 mg Oral q1800  . QUEtiapine  200 mg Oral BID  . QUEtiapine  400 mg Oral QHS  . risperiDONE  4 mg Oral QHS  . tamsulosin  0.4 mg Oral Daily  . tiotropium  18 mcg Inhalation Daily   acetaminophen **OR** acetaminophen, ondansetron **OR** ondansetron (ZOFRAN) IV  Assessment/ Plan:  56 y.o. AA male with COPD, HTN, Multinodular goiter, Schizoaffective disorder, CKD st 4, Primary hyperparathyroidism with hypercalcemia admitted for ARF  1. ARF on CKD st 4 Baseline Cr 3.39/GFR 23 from March 2018 Admit Cr 4.87  -  No new renal function testing today. Yesterday creatinine was 3.35. This is back to his baseline. We will need to continue to monitor his renal function over time.  2. Hypercalcemia with primary hyperparathyroidism Most recent serum calcium was 11.0. Thyroidectomy and parathyroidectomy have been considered as an outpatient previously. Continue to monitor serum calcium.  3. Hypokalemia and  hypernatremia - Recommend continued monitoring of serum potassium as well as sodium as an outpatient.  4.  Patient has outpt follow up scheduled with Dr. Thedore MinsSingh on 01/31/17.    LOS: 5 Ariya Bohannon 6/26/201811:01 AM  [facility] [place], [place] [phone]

## 2017-02-03 ENCOUNTER — Ambulatory Visit: Payer: Self-pay

## 2017-02-13 ENCOUNTER — Telehealth: Payer: Self-pay | Admitting: Urology

## 2017-02-16 ENCOUNTER — Inpatient Hospital Stay: Payer: Medicare Other

## 2017-02-16 ENCOUNTER — Encounter: Payer: Self-pay | Admitting: *Deleted

## 2017-02-16 ENCOUNTER — Inpatient Hospital Stay
Admission: EM | Admit: 2017-02-16 | Discharge: 2017-02-27 | DRG: 698 | Disposition: A | Discharge status: Hospice | Payer: Medicare Other | Attending: Internal Medicine | Admitting: Internal Medicine

## 2017-02-16 ENCOUNTER — Emergency Department: Payer: Medicare Other

## 2017-02-16 DIAGNOSIS — G9349 Other encephalopathy: Secondary | ICD-10-CM | POA: Diagnosis present

## 2017-02-16 DIAGNOSIS — D631 Anemia in chronic kidney disease: Secondary | ICD-10-CM | POA: Diagnosis present

## 2017-02-16 DIAGNOSIS — Z7189 Other specified counseling: Secondary | ICD-10-CM | POA: Diagnosis not present

## 2017-02-16 DIAGNOSIS — Z79899 Other long term (current) drug therapy: Secondary | ICD-10-CM | POA: Diagnosis not present

## 2017-02-16 DIAGNOSIS — J9600 Acute respiratory failure, unspecified whether with hypoxia or hypercapnia: Secondary | ICD-10-CM

## 2017-02-16 DIAGNOSIS — E860 Dehydration: Secondary | ICD-10-CM | POA: Diagnosis present

## 2017-02-16 DIAGNOSIS — N19 Unspecified kidney failure: Secondary | ICD-10-CM

## 2017-02-16 DIAGNOSIS — Z66 Do not resuscitate: Secondary | ICD-10-CM

## 2017-02-16 DIAGNOSIS — F259 Schizoaffective disorder, unspecified: Secondary | ICD-10-CM | POA: Diagnosis present

## 2017-02-16 DIAGNOSIS — N189 Chronic kidney disease, unspecified: Secondary | ICD-10-CM | POA: Diagnosis not present

## 2017-02-16 DIAGNOSIS — E872 Acidosis: Secondary | ICD-10-CM

## 2017-02-16 DIAGNOSIS — N390 Urinary tract infection, site not specified: Secondary | ICD-10-CM | POA: Diagnosis present

## 2017-02-16 DIAGNOSIS — R259 Unspecified abnormal involuntary movements: Secondary | ICD-10-CM | POA: Diagnosis not present

## 2017-02-16 DIAGNOSIS — S0990XA Unspecified injury of head, initial encounter: Secondary | ICD-10-CM | POA: Diagnosis present

## 2017-02-16 DIAGNOSIS — N186 End stage renal disease: Secondary | ICD-10-CM | POA: Diagnosis present

## 2017-02-16 DIAGNOSIS — F17210 Nicotine dependence, cigarettes, uncomplicated: Secondary | ICD-10-CM | POA: Diagnosis present

## 2017-02-16 DIAGNOSIS — R6521 Severe sepsis with septic shock: Secondary | ICD-10-CM | POA: Diagnosis present

## 2017-02-16 DIAGNOSIS — E870 Hyperosmolality and hypernatremia: Secondary | ICD-10-CM | POA: Diagnosis not present

## 2017-02-16 DIAGNOSIS — G9341 Metabolic encephalopathy: Secondary | ICD-10-CM | POA: Diagnosis not present

## 2017-02-16 DIAGNOSIS — E210 Primary hyperparathyroidism: Secondary | ICD-10-CM | POA: Diagnosis present

## 2017-02-16 DIAGNOSIS — Y846 Urinary catheterization as the cause of abnormal reaction of the patient, or of later complication, without mention of misadventure at the time of the procedure: Secondary | ICD-10-CM | POA: Diagnosis present

## 2017-02-16 DIAGNOSIS — E042 Nontoxic multinodular goiter: Secondary | ICD-10-CM | POA: Diagnosis present

## 2017-02-16 DIAGNOSIS — E8729 Other acidosis: Secondary | ICD-10-CM

## 2017-02-16 DIAGNOSIS — I120 Hypertensive chronic kidney disease with stage 5 chronic kidney disease or end stage renal disease: Secondary | ICD-10-CM | POA: Diagnosis present

## 2017-02-16 DIAGNOSIS — Z515 Encounter for palliative care: Secondary | ICD-10-CM | POA: Diagnosis not present

## 2017-02-16 DIAGNOSIS — J449 Chronic obstructive pulmonary disease, unspecified: Secondary | ICD-10-CM | POA: Diagnosis present

## 2017-02-16 DIAGNOSIS — R569 Unspecified convulsions: Secondary | ICD-10-CM | POA: Diagnosis not present

## 2017-02-16 DIAGNOSIS — G40409 Other generalized epilepsy and epileptic syndromes, not intractable, without status epilepticus: Secondary | ICD-10-CM | POA: Diagnosis not present

## 2017-02-16 DIAGNOSIS — R410 Disorientation, unspecified: Secondary | ICD-10-CM | POA: Diagnosis not present

## 2017-02-16 DIAGNOSIS — K219 Gastro-esophageal reflux disease without esophagitis: Secondary | ICD-10-CM | POA: Diagnosis present

## 2017-02-16 DIAGNOSIS — R451 Restlessness and agitation: Secondary | ICD-10-CM | POA: Diagnosis not present

## 2017-02-16 DIAGNOSIS — N179 Acute kidney failure, unspecified: Secondary | ICD-10-CM | POA: Diagnosis not present

## 2017-02-16 DIAGNOSIS — R6510 Systemic inflammatory response syndrome (SIRS) of non-infectious origin without acute organ dysfunction: Secondary | ICD-10-CM

## 2017-02-16 DIAGNOSIS — R4182 Altered mental status, unspecified: Secondary | ICD-10-CM

## 2017-02-16 DIAGNOSIS — J9601 Acute respiratory failure with hypoxia: Secondary | ICD-10-CM | POA: Diagnosis present

## 2017-02-16 DIAGNOSIS — E875 Hyperkalemia: Secondary | ICD-10-CM | POA: Diagnosis present

## 2017-02-16 DIAGNOSIS — N170 Acute kidney failure with tubular necrosis: Secondary | ICD-10-CM | POA: Diagnosis present

## 2017-02-16 DIAGNOSIS — T83518A Infection and inflammatory reaction due to other urinary catheter, initial encounter: Principal | ICD-10-CM | POA: Diagnosis present

## 2017-02-16 DIAGNOSIS — A419 Sepsis, unspecified organism: Secondary | ICD-10-CM | POA: Diagnosis present

## 2017-02-16 DIAGNOSIS — Z4659 Encounter for fitting and adjustment of other gastrointestinal appliance and device: Secondary | ICD-10-CM

## 2017-02-16 DIAGNOSIS — R0602 Shortness of breath: Secondary | ICD-10-CM | POA: Diagnosis present

## 2017-02-16 DIAGNOSIS — Z992 Dependence on renal dialysis: Secondary | ICD-10-CM | POA: Diagnosis not present

## 2017-02-16 DIAGNOSIS — W01198A Fall on same level from slipping, tripping and stumbling with subsequent striking against other object, initial encounter: Secondary | ICD-10-CM | POA: Diagnosis present

## 2017-02-16 LAB — COMPREHENSIVE METABOLIC PANEL
ALBUMIN: 3 g/dL — AB (ref 3.5–5.0)
ALK PHOS: 109 U/L (ref 38–126)
ALT: 18 U/L (ref 17–63)
ALT: 22 U/L (ref 17–63)
ANION GAP: 20 — AB (ref 5–15)
AST: UNDETERMINED U/L (ref 15–41)
AST: 28 U/L (ref 15–41)
Albumin: 3.1 g/dL — ABNORMAL LOW (ref 3.5–5.0)
Alkaline Phosphatase: 114 U/L (ref 38–126)
Anion gap: 22 — ABNORMAL HIGH (ref 5–15)
BILIRUBIN TOTAL: 1 mg/dL (ref 0.3–1.2)
BUN: 140 mg/dL — AB (ref 6–20)
BUN: 134 mg/dL — ABNORMAL HIGH (ref 6–20)
CALCIUM: 9.5 mg/dL (ref 8.9–10.3)
CHLORIDE: 102 mmol/L (ref 101–111)
CO2: 10 mmol/L — AB (ref 22–32)
CO2: 11 mmol/L — ABNORMAL LOW (ref 22–32)
CREATININE: 15.49 mg/dL — AB (ref 0.61–1.24)
Calcium: 9.8 mg/dL (ref 8.9–10.3)
Chloride: 102 mmol/L (ref 101–111)
Creatinine, Ser: 15.14 mg/dL — ABNORMAL HIGH (ref 0.61–1.24)
GFR calc Af Amer: 4 mL/min — ABNORMAL LOW (ref >60)
GFR calc non Af Amer: 3 mL/min — ABNORMAL LOW (ref >60)
GFR calc non Af Amer: 3 mL/min — ABNORMAL LOW (ref >60)
GFR, EST AFRICAN AMERICAN: 4 mL/min — AB (ref >60)
Glucose, Bld: 143 mg/dL — ABNORMAL HIGH (ref 65–99)
Glucose, Bld: 137 mg/dL — ABNORMAL HIGH (ref 65–99)
POTASSIUM: 7.5 mmol/L — AB (ref 3.5–5.1)
Potassium: UNDETERMINED mmol/L (ref 3.5–5.1)
SODIUM: 134 mmol/L — AB (ref 135–145)
Sodium: 133 mmol/L — ABNORMAL LOW (ref 135–145)
TOTAL PROTEIN: 7.9 g/dL (ref 6.5–8.1)
Total Bilirubin: 1.5 mg/dL — ABNORMAL HIGH (ref 0.3–1.2)
Total Protein: 8.2 g/dL — ABNORMAL HIGH (ref 6.5–8.1)

## 2017-02-16 LAB — BLOOD GAS, ARTERIAL
Acid-base deficit: 13.2 mmol/L — ABNORMAL HIGH (ref 0.0–2.0)
Bicarbonate: 12.6 mmol/L — ABNORMAL LOW (ref 20.0–28.0)
FIO2: 0.4
MECHANICAL RATE: 16
O2 SAT: 99.4 %
PEEP/CPAP: 5 cmH2O
PO2 ART: 174 mmHg — AB (ref 83.0–108.0)
Patient temperature: 37
pCO2 arterial: 28 mmHg — ABNORMAL LOW (ref 32.0–48.0)
pH, Arterial: 7.26 — ABNORMAL LOW (ref 7.350–7.450)

## 2017-02-16 LAB — URINALYSIS, COMPLETE (UACMP) WITH MICROSCOPIC
BILIRUBIN URINE: NEGATIVE
Glucose, UA: NEGATIVE mg/dL
Ketones, ur: NEGATIVE mg/dL
Nitrite: NEGATIVE
PH: 5 (ref 5.0–8.0)
Protein, ur: 100 mg/dL — AB
SPECIFIC GRAVITY, URINE: 1.011 (ref 1.005–1.030)

## 2017-02-16 LAB — CBC WITH DIFFERENTIAL/PLATELET
BASOS PCT: 0 %
Basophils Absolute: 0.1 10*3/uL (ref 0–0.1)
EOS ABS: 0 10*3/uL (ref 0–0.7)
EOS PCT: 0 %
HCT: 29.6 % — ABNORMAL LOW (ref 40.0–52.0)
Hemoglobin: 10 g/dL — ABNORMAL LOW (ref 13.0–18.0)
Lymphocytes Relative: 2 %
Lymphs Abs: 0.3 10*3/uL — ABNORMAL LOW (ref 1.0–3.6)
MCH: 30 pg (ref 26.0–34.0)
MCHC: 33.8 g/dL (ref 32.0–36.0)
MCV: 88.8 fL (ref 80.0–100.0)
Monocytes Absolute: 0.6 10*3/uL (ref 0.2–1.0)
Monocytes Relative: 3 %
Neutro Abs: 16.7 10*3/uL — ABNORMAL HIGH (ref 1.4–6.5)
Neutrophils Relative %: 95 %
PLATELETS: 398 10*3/uL (ref 150–440)
RBC: 3.33 MIL/uL — AB (ref 4.40–5.90)
RDW: 15.6 % — ABNORMAL HIGH (ref 11.5–14.5)
WBC: 17.6 10*3/uL — AB (ref 3.8–10.6)

## 2017-02-16 LAB — PROCALCITONIN: Procalcitonin: 8.58 ng/mL

## 2017-02-16 LAB — POTASSIUM
POTASSIUM: 3.8 mmol/L (ref 3.5–5.1)
Potassium: 3.6 mmol/L (ref 3.5–5.1)

## 2017-02-16 LAB — GLUCOSE, CAPILLARY
Glucose-Capillary: 127 mg/dL — ABNORMAL HIGH (ref 65–99)
Glucose-Capillary: 143 mg/dL — ABNORMAL HIGH (ref 65–99)

## 2017-02-16 LAB — LACTIC ACID, PLASMA
LACTIC ACID, VENOUS: 2.3 mmol/L — AB (ref 0.5–1.9)
LACTIC ACID, VENOUS: 0.7 mmol/L (ref 0.5–1.9)

## 2017-02-16 LAB — TSH: TSH: 0.079 u[IU]/mL — AB (ref 0.350–4.500)

## 2017-02-16 LAB — MRSA PCR SCREENING: MRSA by PCR: NEGATIVE

## 2017-02-16 LAB — TROPONIN I: TROPONIN I: 0.04 ng/mL — AB (ref <0.03)

## 2017-02-16 LAB — TRIGLYCERIDES: Triglycerides: 265 mg/dL — ABNORMAL HIGH (ref <150)

## 2017-02-16 MED ORDER — LORAZEPAM 2 MG/ML IJ SOLN
INTRAMUSCULAR | Status: AC
  Filled 2017-02-16: qty 1

## 2017-02-16 MED ORDER — VANCOMYCIN HCL IN DEXTROSE 1-5 GM/200ML-% IV SOLN
1000.0000 mg | Freq: Once | INTRAVENOUS | Status: AC
  Administered 2017-02-16: 1000 mg via INTRAVENOUS
  Filled 2017-02-16: qty 200

## 2017-02-16 MED ORDER — CHLORHEXIDINE GLUCONATE 0.12% ORAL RINSE (MEDLINE KIT)
15.0000 mL | Freq: Two times a day (BID) | OROMUCOSAL | Status: DC
  Administered 2017-02-16 – 2017-02-26 (×15): 15 mL via OROMUCOSAL

## 2017-02-16 MED ORDER — CEFEPIME HCL 1 G IJ SOLR
1.0000 g | INTRAMUSCULAR | Status: DC
  Administered 2017-02-16: 1 g via INTRAVENOUS
  Filled 2017-02-16 (×2): qty 1

## 2017-02-16 MED ORDER — HEPARIN SODIUM (PORCINE) 5000 UNIT/ML IJ SOLN
5000.0000 [IU] | Freq: Three times a day (TID) | INTRAMUSCULAR | Status: DC
  Administered 2017-02-16 – 2017-02-26 (×28): 5000 [IU] via SUBCUTANEOUS
  Filled 2017-02-16 (×27): qty 1

## 2017-02-16 MED ORDER — BACITRACIN 500 UNIT/GM OP OINT
TOPICAL_OINTMENT | Freq: Two times a day (BID) | OPHTHALMIC | Status: AC
Start: 2017-02-16 — End: 2017-02-21
  Administered 2017-02-16: 22:00:00 via OPHTHALMIC
  Administered 2017-02-17: 1 via OPHTHALMIC
  Administered 2017-02-17 – 2017-02-21 (×7): via OPHTHALMIC
  Filled 2017-02-16 (×2): qty 3.5

## 2017-02-16 MED ORDER — INSULIN ASPART 100 UNIT/ML ~~LOC~~ SOLN
SUBCUTANEOUS | Status: AC
  Administered 2017-02-16: 5 [IU] via INTRAVENOUS
  Filled 2017-02-16: qty 1

## 2017-02-16 MED ORDER — SODIUM BICARBONATE 8.4 % IV SOLN
50.0000 meq | Freq: Once | INTRAVENOUS | Status: AC
  Administered 2017-02-16: 50 meq via INTRAVENOUS
  Filled 2017-02-16: qty 50

## 2017-02-16 MED ORDER — PROPOFOL 1000 MG/100ML IV EMUL: 0.0000 ug/kg/min | INTRAVENOUS | Status: DC

## 2017-02-16 MED ORDER — ROCURONIUM BROMIDE 50 MG/5ML IV SOLN
100.0000 mg | Freq: Once | INTRAVENOUS | Status: AC
  Administered 2017-02-16: 100 mg via INTRAVENOUS
  Filled 2017-02-16: qty 10

## 2017-02-16 MED ORDER — LIDOCAINE HCL (PF) 1 % IJ SOLN
INTRAMUSCULAR | Status: AC
  Administered 2017-02-16: 5 mL via INTRADERMAL
  Filled 2017-02-16: qty 5

## 2017-02-16 MED ORDER — SODIUM BICARBONATE 8.4 % IV SOLN
50.0000 meq | Freq: Once | INTRAVENOUS | Status: AC
  Administered 2017-02-16: 50 meq via INTRAVENOUS

## 2017-02-16 MED ORDER — BISACODYL 10 MG RE SUPP: 10.0000 mg | Freq: Every day | RECTAL | Status: DC | PRN

## 2017-02-16 MED ORDER — SODIUM CHLORIDE 0.9 % IV SOLN
1000.0000 mL | INTRAVENOUS | Status: DC
  Administered 2017-02-16: 1000 mL via INTRAVENOUS

## 2017-02-16 MED ORDER — SODIUM CHLORIDE 0.9 % IV SOLN: INTRAVENOUS | Status: DC

## 2017-02-16 MED ORDER — SODIUM BICARBONATE 8.4 % IV SOLN
INTRAVENOUS | Status: AC
  Filled 2017-02-16: qty 50

## 2017-02-16 MED ORDER — SODIUM CHLORIDE 0.9 % IV SOLN
0.0000 ug/h | INTRAVENOUS | Status: DC
  Administered 2017-02-16: 50 ug/h via INTRAVENOUS
  Filled 2017-02-16: qty 50

## 2017-02-16 MED ORDER — SODIUM CHLORIDE 0.9 % IV SOLN
Freq: Once | INTRAVENOUS | Status: AC
  Administered 2017-02-16: 19:00:00 via INTRAVENOUS

## 2017-02-16 MED ORDER — PANTOPRAZOLE SODIUM 40 MG IV SOLR
40.0000 mg | Freq: Every day | INTRAVENOUS | Status: DC
  Administered 2017-02-16 – 2017-02-18 (×3): 40 mg via INTRAVENOUS
  Filled 2017-02-16 (×3): qty 40

## 2017-02-16 MED ORDER — SODIUM CHLORIDE 0.9 % IV SOLN
1500.0000 mg | Freq: Once | INTRAVENOUS | Status: AC
  Administered 2017-02-16: 1500 mg via INTRAVENOUS
  Filled 2017-02-16: qty 15

## 2017-02-16 MED ORDER — MIDAZOLAM HCL 2 MG/2ML IJ SOLN: 2.0000 mg | INTRAMUSCULAR | Status: DC | PRN

## 2017-02-16 MED ORDER — TETRACAINE HCL 0.5 % OP SOLN
2.0000 [drp] | Freq: Once | OPHTHALMIC | Status: AC
  Administered 2017-02-16: 2 [drp] via OPHTHALMIC
  Filled 2017-02-16: qty 4

## 2017-02-16 MED ORDER — FLUORESCEIN SODIUM 0.6 MG OP STRP
1.0000 | ORAL_STRIP | Freq: Once | OPHTHALMIC | Status: AC
  Administered 2017-02-16: 1 via OPHTHALMIC
  Filled 2017-02-16: qty 1

## 2017-02-16 MED ORDER — IPRATROPIUM-ALBUTEROL 0.5-2.5 (3) MG/3ML IN SOLN
3.0000 mL | Freq: Four times a day (QID) | RESPIRATORY_TRACT | Status: DC
  Administered 2017-02-16 – 2017-02-17 (×4): 3 mL via RESPIRATORY_TRACT
  Filled 2017-02-16 (×4): qty 3

## 2017-02-16 MED ORDER — ORAL CARE MOUTH RINSE
15.0000 mL | Freq: Four times a day (QID) | OROMUCOSAL | Status: DC
  Administered 2017-02-16 – 2017-02-25 (×23): 15 mL via OROMUCOSAL

## 2017-02-16 MED ORDER — DEXTROSE 50 % IV SOLN
25.0000 g | Freq: Once | INTRAVENOUS | Status: AC
  Administered 2017-02-16: 25 g via INTRAVENOUS
  Filled 2017-02-16: qty 50

## 2017-02-16 MED ORDER — ACETAMINOPHEN 325 MG PO TABS
650.0000 mg | ORAL_TABLET | Freq: Four times a day (QID) | ORAL | Status: DC | PRN
  Administered 2017-02-25: 650 mg via ORAL
  Filled 2017-02-16: qty 2

## 2017-02-16 MED ORDER — CALCIUM GLUCONATE 10 % IV SOLN
1.0000 g | Freq: Once | INTRAVENOUS | Status: AC
  Administered 2017-02-16: 1 g via INTRAVENOUS
  Filled 2017-02-16: qty 10

## 2017-02-16 MED ORDER — ALBUTEROL SULFATE (2.5 MG/3ML) 0.083% IN NEBU
5.0000 mg | INHALATION_SOLUTION | Freq: Once | RESPIRATORY_TRACT | Status: AC
  Administered 2017-02-16: 5 mg via RESPIRATORY_TRACT
  Filled 2017-02-16: qty 6

## 2017-02-16 MED ORDER — PIPERACILLIN-TAZOBACTAM 3.375 G IVPB: 3.3750 g | Freq: Two times a day (BID) | INTRAVENOUS | Status: DC

## 2017-02-16 MED ORDER — SENNOSIDES 8.8 MG/5ML PO SYRP
5.0000 mL | ORAL_SOLUTION | Freq: Two times a day (BID) | ORAL | Status: DC | PRN
  Filled 2017-02-16: qty 5

## 2017-02-16 MED ORDER — ONDANSETRON HCL 4 MG/2ML IJ SOLN: 4.0000 mg | Freq: Four times a day (QID) | INTRAMUSCULAR | Status: DC | PRN

## 2017-02-16 MED ORDER — ACETAMINOPHEN 650 MG RE SUPP
650.0000 mg | Freq: Four times a day (QID) | RECTAL | Status: DC | PRN
  Administered 2017-02-19: 650 mg via RECTAL
  Filled 2017-02-16: qty 1

## 2017-02-16 MED ORDER — LORAZEPAM 2 MG/ML IJ SOLN
0.5000 mg | Freq: Once | INTRAMUSCULAR | Status: AC
  Administered 2017-02-16: 0.5 mg via INTRAVENOUS
  Filled 2017-02-16: qty 1

## 2017-02-16 MED ORDER — ETOMIDATE 2 MG/ML IV SOLN
20.0000 mg | Freq: Once | INTRAVENOUS | Status: AC
  Administered 2017-02-16: 20 mg via INTRAVENOUS

## 2017-02-16 MED ORDER — LIDOCAINE HCL (PF) 1 % IJ SOLN
5.0000 mL | Freq: Once | INTRAMUSCULAR | Status: AC
  Administered 2017-02-16: 5 mL via INTRADERMAL

## 2017-02-16 MED ORDER — LORAZEPAM 2 MG/ML IJ SOLN
1.0000 mg | Freq: Once | INTRAMUSCULAR | Status: AC
  Administered 2017-02-16: 1 mg via INTRAVENOUS

## 2017-02-16 MED ORDER — INSULIN ASPART 100 UNIT/ML ~~LOC~~ SOLN
5.0000 [IU] | Freq: Once | SUBCUTANEOUS | Status: AC
  Administered 2017-02-16: 5 [IU] via INTRAVENOUS

## 2017-02-16 MED ORDER — ONDANSETRON HCL 4 MG PO TABS: 4.0000 mg | ORAL_TABLET | Freq: Four times a day (QID) | ORAL | Status: DC | PRN

## 2017-02-16 MED ORDER — STERILE WATER FOR INJECTION IV SOLN
INTRAVENOUS | Status: DC
  Administered 2017-02-16: 19:00:00 via INTRAVENOUS
  Filled 2017-02-16 (×3): qty 850

## 2017-02-16 MED ORDER — PIPERACILLIN-TAZOBACTAM 3.375 G IVPB 30 MIN
3.3750 g | Freq: Once | INTRAVENOUS | Status: AC
  Administered 2017-02-16: 3.375 g via INTRAVENOUS

## 2017-02-16 NOTE — Progress Notes (Signed)
HD COMPLETED  

## 2017-02-16 NOTE — Progress Notes (Signed)
[facility], [place] 02/16/17  Subjective:   Patient is known to our practice from outpatient and is followed for CKD He missed his outpatient follow up. During previous admission, Patient was found to have urinary retention. Foley cathter was placed Patient was brought in by EMS post fall. Patient is thought to have seizure  In ER, noted to have severe ARF. Creatinine 15, potassium 7.5, severely acidotic. Damaged Foley. Foley placed in ER and cloudy urine obtained. Patient intubated in ER Emergent consult for Dialysis.  Dialysis cathter was placed in the ER. To be admitted to ICU Current;y getting bicarbonate drip for correction of acidosis and hyperkalemia   Objective:  Vital signs in last 24 hours:  Temp:  [97.5 F (36.4 C)] 97.5 F (36.4 C) (07/15 1609) Pulse Rate:  [85-99] 85 (07/15 1920) Resp:  [16-25] 25 (07/15 1920) BP: (114-162)/(78-143) 120/81 (07/15 1920) SpO2:  [98 %-100 %] 100 % (07/15 1920) FiO2 (%):  [40 %] 40 % (07/15 1910) Weight:  [81.6 kg (180 lb)] 81.6 kg (180 lb) (07/15 1611)  Weight change:  Filed Weights   02/16/17 1611  Weight: 81.6 kg (180 lb)    Intake/Output:   No intake or output data in the 24 hours ending 02/16/17 1946   Physical Exam: General: Critically ill appearing, laying in bed  HEENT ETT, OGT in place  Neck No masses  Pulm/lungs Vent assisted, no crackles  CVS/Heart Regular; no rub  Abdomen:  Soft, distended,   Extremities: No edema  Neurologic: sedated  Skin: No acute rashes   Foley in place with yellow urine       Basic Metabolic Panel:   Recent Labs Lab 02/16/17 1615 02/16/17 1710  NA 134* 133*  K UNABLE TO REPORT DUE TO HEMOLYSIS 7.5*  CL 102 102  CO2 10* 11*  GLUCOSE 143* 137*  BUN 140* 134*  CREATININE 15.49* 15.14*  CALCIUM 9.8 9.5     CBC:  Recent Labs Lab 02/16/17 1615  WBC 17.6*  NEUTROABS 16.7*  HGB 10.0*  HCT 29.6*  MCV 88.8  PLT 398     No results found for:  HEPBSAG, HEPBSAB, HEPBIGM    Microbiology:  No results found for this or any previous visit (from the past 240 hour(s)).  Coagulation Studies: No results for input(s): LABPROT, INR in the last 72 hours.  Urinalysis:  Recent Labs  02/16/17 1615  COLORURINE YELLOW*  LABSPEC 1.011  PHURINE 5.0  GLUCOSEU NEGATIVE  HGBUR MODERATE*  BILIRUBINUR NEGATIVE  KETONESUR NEGATIVE  PROTEINUR 100*  NITRITE NEGATIVE  LEUKOCYTESUR LARGE*      Imaging: Ct Head Wo Contrast  Result Date: 02/16/2017 CLINICAL DATA:  Fall. Hit head on night stand. Seizure. Persistent confusion. EXAM: CT HEAD WITHOUT CONTRAST CT MAXILLOFACIAL WITHOUT CONTRAST CT CERVICAL SPINE WITHOUT CONTRAST TECHNIQUE: Multidetector CT imaging of the head, cervical spine, and maxillofacial structures were performed using the standard protocol without intravenous contrast. Multiplanar CT image reconstructions of the cervical spine and maxillofacial structures were also generated. COMPARISON:  None. FINDINGS: CT HEAD FINDINGS Brain: Mild generalized atrophy is present. No acute infarct, hemorrhage, or mass lesion is present. The ventricles are of normal size. No significant extraaxial fluid collection is present. The brainstem and cerebellum are normal. The internal auditory canals are within normal limits. Vascular: No hyperdense vessel or unexpected calcification. Skull: Left periorbital soft tissue swelling is noted. There is no underlying fracture. The calvarium is intact. No significant extracranial soft tissue injury is present otherwise.  CT MAXILLOFACIAL FINDINGS Osseous: No acute fracture is present. No focal lytic or blastic lesions are present. Orbits: Left periorbital soft tissue swelling is present. The globes and orbits are within normal limits. No acute fracture is present. Sinuses: The paranasal sinuses and mastoid air cells are clear. Soft tissues: Soft tissues of the face are otherwise unremarkable. No additional soft tissue  injury is evident. CT CERVICAL SPINE FINDINGS Alignment: AP alignment is anatomic. Mild rightward curvature is present. There is straightening and slight reversal of the normal cervical lordosis. Skull base and vertebrae: The skullbase is within normal limits. Vertebral body heights are maintained. No acute fracture is present. Soft tissues and spinal canal: The soft tissues of the neck demonstrate bilateral enlargement of the thyroid. No discrete mass lesion is present. There is no significant adenopathy. No other focal soft tissue abnormality is present. Disc levels:  No significant focal stenosis is evident. Upper chest: The visualized lung apices are clear. IMPRESSION: 1. Left periorbital soft tissue swelling without underlying fracture. 2. No acute intracranial abnormality. 3. No other significant facial injury. 4. Straightening and slight reversal of the normal cervical lordosis without evidence for acute fracture or traumatic subluxation in the cervical spine. 5. Enlarged thyroid gland without discrete lesion. Electronically Signed   By: Marin Robertshristopher  Mattern M.D.   On: 02/16/2017 16:58   Ct Cervical Spine Wo Contrast  Result Date: 02/16/2017 CLINICAL DATA:  Fall. Hit head on night stand. Seizure. Persistent confusion. EXAM: CT HEAD WITHOUT CONTRAST CT MAXILLOFACIAL WITHOUT CONTRAST CT CERVICAL SPINE WITHOUT CONTRAST TECHNIQUE: Multidetector CT imaging of the head, cervical spine, and maxillofacial structures were performed using the standard protocol without intravenous contrast. Multiplanar CT image reconstructions of the cervical spine and maxillofacial structures were also generated. COMPARISON:  None. FINDINGS: CT HEAD FINDINGS Brain: Mild generalized atrophy is present. No acute infarct, hemorrhage, or mass lesion is present. The ventricles are of normal size. No significant extraaxial fluid collection is present. The brainstem and cerebellum are normal. The internal auditory canals are within normal  limits. Vascular: No hyperdense vessel or unexpected calcification. Skull: Left periorbital soft tissue swelling is noted. There is no underlying fracture. The calvarium is intact. No significant extracranial soft tissue injury is present otherwise. CT MAXILLOFACIAL FINDINGS Osseous: No acute fracture is present. No focal lytic or blastic lesions are present. Orbits: Left periorbital soft tissue swelling is present. The globes and orbits are within normal limits. No acute fracture is present. Sinuses: The paranasal sinuses and mastoid air cells are clear. Soft tissues: Soft tissues of the face are otherwise unremarkable. No additional soft tissue injury is evident. CT CERVICAL SPINE FINDINGS Alignment: AP alignment is anatomic. Mild rightward curvature is present. There is straightening and slight reversal of the normal cervical lordosis. Skull base and vertebrae: The skullbase is within normal limits. Vertebral body heights are maintained. No acute fracture is present. Soft tissues and spinal canal: The soft tissues of the neck demonstrate bilateral enlargement of the thyroid. No discrete mass lesion is present. There is no significant adenopathy. No other focal soft tissue abnormality is present. Disc levels:  No significant focal stenosis is evident. Upper chest: The visualized lung apices are clear. IMPRESSION: 1. Left periorbital soft tissue swelling without underlying fracture. 2. No acute intracranial abnormality. 3. No other significant facial injury. 4. Straightening and slight reversal of the normal cervical lordosis without evidence for acute fracture or traumatic subluxation in the cervical spine. 5. Enlarged thyroid gland without discrete lesion. Electronically Signed  By: Marin Roberts M.D.   On: 02/16/2017 16:58   Dg Chest Portable 1 View  Result Date: 02/16/2017 CLINICAL DATA:  56 year old male status post intubation. EXAM: PORTABLE CHEST 1 VIEW COMPARISON:  Chest x-ray a 02/16/2017.  FINDINGS: An endotracheal tube is in place with tip 2.8 cm above the carina. A nasogastric tube is seen extending into the stomach, however, the tip of the nasogastric tube extends below the lower margin of the image. Lung volumes are low. No consolidative airspace disease. No pleural effusions. No pneumothorax. No suspicious appearing pulmonary nodules or masses. Heart size is normal. Upper mediastinal contours are within normal limits. IMPRESSION: 1. Support apparatus, as above. 2. Low lung volumes without radiographic evidence of acute cardiopulmonary disease. Electronically Signed   By: Trudie Reed M.D.   On: 02/16/2017 19:29   Dg Chest Port 1 View  Result Date: 02/16/2017 CLINICAL DATA:  Recent seizure and fall, initial encounter EXAM: PORTABLE CHEST 1 VIEW COMPARISON:  None. FINDINGS: Cardiac shadow is within normal limits. The lungs are well aerated bilaterally. No acute bony abnormality is seen. IMPRESSION: No acute abnormality noted. Electronically Signed   By: Alcide Clever M.D.   On: 02/16/2017 17:11   Ct Maxillofacial Wo Contrast  Result Date: 02/16/2017 CLINICAL DATA:  Fall. Hit head on night stand. Seizure. Persistent confusion. EXAM: CT HEAD WITHOUT CONTRAST CT MAXILLOFACIAL WITHOUT CONTRAST CT CERVICAL SPINE WITHOUT CONTRAST TECHNIQUE: Multidetector CT imaging of the head, cervical spine, and maxillofacial structures were performed using the standard protocol without intravenous contrast. Multiplanar CT image reconstructions of the cervical spine and maxillofacial structures were also generated. COMPARISON:  None. FINDINGS: CT HEAD FINDINGS Brain: Mild generalized atrophy is present. No acute infarct, hemorrhage, or mass lesion is present. The ventricles are of normal size. No significant extraaxial fluid collection is present. The brainstem and cerebellum are normal. The internal auditory canals are within normal limits. Vascular: No hyperdense vessel or unexpected calcification. Skull:  Left periorbital soft tissue swelling is noted. There is no underlying fracture. The calvarium is intact. No significant extracranial soft tissue injury is present otherwise. CT MAXILLOFACIAL FINDINGS Osseous: No acute fracture is present. No focal lytic or blastic lesions are present. Orbits: Left periorbital soft tissue swelling is present. The globes and orbits are within normal limits. No acute fracture is present. Sinuses: The paranasal sinuses and mastoid air cells are clear. Soft tissues: Soft tissues of the face are otherwise unremarkable. No additional soft tissue injury is evident. CT CERVICAL SPINE FINDINGS Alignment: AP alignment is anatomic. Mild rightward curvature is present. There is straightening and slight reversal of the normal cervical lordosis. Skull base and vertebrae: The skullbase is within normal limits. Vertebral body heights are maintained. No acute fracture is present. Soft tissues and spinal canal: The soft tissues of the neck demonstrate bilateral enlargement of the thyroid. No discrete mass lesion is present. There is no significant adenopathy. No other focal soft tissue abnormality is present. Disc levels:  No significant focal stenosis is evident. Upper chest: The visualized lung apices are clear. IMPRESSION: 1. Left periorbital soft tissue swelling without underlying fracture. 2. No acute intracranial abnormality. 3. No other significant facial injury. 4. Straightening and slight reversal of the normal cervical lordosis without evidence for acute fracture or traumatic subluxation in the cervical spine. 5. Enlarged thyroid gland without discrete lesion. Electronically Signed   By: Marin Roberts M.D.   On: 02/16/2017 16:58     Medications:   . sodium chloride 1,000 mL (  02/16/17 1622)  . fentaNYL infusion INTRAVENOUS    . [START ON 02/17/2017] piperacillin-tazobactam (ZOSYN)  IV    .  sodium bicarbonate (isotonic) infusion in sterile water    . vancomycin 1,000 mg  (02/16/17 1907)  . [START ON 02/17/2017] vancomycin     . LORazepam         Assessment/ Plan:  56 y.o. AA male with COPD, HTN, Multinodular goiter, Schizoaffective disorder, CKD st 4, Primary hyperparathyroidism with hypercalcemia admitted for ARF  1. ARF on CKD st 4  Baseline Cr 3.3/GFR 23 from 01/27/2017 Admit Cr 15 ; likely secondary to underlying volume depletion, sepsis and severe ATN 2.  Severe hyperkalemia 3. Severe Acidosis 4. UTI with sepsis 5. Acute resp failure - requiring mechanical ventilation 6. Recent Urinary retention  Plan for emergent HD for hyperkalemia, acidosis and uremia No family immediately available. Emergent HD due to life threatening situation Patient to be admitted to ICU 1 K HD with frequent potassium checks.  Agree with continuing bicarbonate drip Consider HD again on Monday Evaluate for ESRD and outpatient placement depending on whether there is renal recovery after this severe episode of AKI Broad spectrum Abx as per ICU and hospitalist team Will follow     LOS: 0 [facility] 7/15/20187:46 PM  [facility] [place], [place] [phone]

## 2017-02-16 NOTE — ED Provider Notes (Signed)
[facility] Emergency Department Provider Note    First MD Initiated Contact with Patient 02/16/17 1613     (approximate)  I have reviewed the triage vital signs and the nursing notes.   HISTORY  Chief Complaint Seizures  Level V Caveat:  Acute encephalopathy - postictal  HPI Jason Diaz is a 56 y.o. male presents from [facility] due to seizure-like activity. Per report the patient was getting up out of bed and slipped on his urine. He fell and hit his head and then had generalized tonic-clonic seizure. Total seizure duration lasting roughly 7 minutes. EMS found patient and he was altered not following commands. He was given IM Versed. EKG was done which initially showed concerning ST changes in aVR, the patient denied any chest pain. Patient had additional seizure-like activity with normal rhythm on the monitor. Blood pressure has been stable. Glucose normal. An history of seizures.   Past Medical History:  Diagnosis Date  . CKD (chronic kidney disease), stage IV (HCC)   . COPD (chronic obstructive pulmonary disease) (HCC)   . GERD (gastroesophageal reflux disease)   . Hypertension   . Multinodular goiter   . Schizoaffective disorder (HCC)    Family History  Problem Relation Age of Onset  . Kidney disease Father   . Diabetes Father    Past Surgical History:  Procedure Laterality Date  . TONSILLECTOMY    . TOOTH EXTRACTION     Patient Active Problem List   Diagnosis Date Noted  . Septic shock (HCC) 02/16/2017  . Hypercalcemia 01/27/2017  . Hematuria 01/27/2017  . Acute urinary retention 01/27/2017  . Hyponatremia 01/27/2017  . Hypokalemia 01/27/2017  . ARF (acute renal failure) (HCC) 01/23/2017      Prior to Admission medications   Medication Sig Start Date End Date Taking? Authorizing Provider  amLODipine (NORVASC) 5 MG tablet Take 5 mg by mouth daily. 01/03/17  Yes [provider]  Cholecalciferol (VITAMIN D3) 5000  units TABS Take 5,000 Units by mouth daily.   Yes [provider]  ezetimibe (ZETIA) 10 MG tablet Take 10 mg by mouth daily. 01/03/17  Yes [provider]  fluPHENAZine (PROLIXIN) 5 MG tablet Take 5 mg by mouth at bedtime as needed.  01/03/17  Yes [provider]  furosemide (LASIX) 40 MG tablet Take 40 mg by mouth daily. 01/03/17  Yes [provider]  gemfibrozil (LOPID) 600 MG tablet Take 600 mg by mouth 2 (two) times daily. 01/03/17  Yes [provider]  metoprolol succinate (TOPROL-XL) 100 MG 24 hr tablet Take 100 mg by mouth daily. 01/03/17  Yes [provider]  pantoprazole (PROTONIX) 40 MG tablet Take 40 mg by mouth daily. 01/03/17  Yes [provider]  polyethylene glycol (MIRALAX / GLYCOLAX) packet Take 17 g by mouth daily. 01/28/17  Yes Katharina Caper, MD  potassium chloride (K-DUR) 10 MEQ tablet Take 10 mEq by mouth daily. 01/03/17  Yes [provider]  pravastatin (PRAVACHOL) 40 MG tablet Take 40 mg by mouth daily. 01/03/17  Yes [provider]  QUEtiapine (SEROQUEL) 400 MG tablet Take 200-400 mg by mouth See admin instructions. tk 0.5 tab qam and 0.5 tab at noon, then take one tab qhs 01/03/17  Yes [provider]  risperidone (RISPERDAL) 4 MG tablet Take 4 mg by mouth 2 (two) times daily. 01/03/17  Yes [provider]  tamsulosin (FLOMAX) 0.4 MG CAPS capsule Take 1 capsule (0.4 mg total) by mouth daily. 01/28/17  Yes Katharina Caper, MD  Tiotropium Bromide Monohydrate (SPIRIVA RESPIMAT) 2.5 MCG/ACT AERS Inhale 2 puffs into the lungs daily.   Yes [provider]  traZODone (DESYREL) 150 MG tablet Take 300 mg by mouth at bedtime as needed.  01/03/17  Yes [provider]    Allergies Patient has no known allergies.    Social History Social History  Substance Use Topics  . Smoking status: Current Every Day Smoker    Packs/day: 0.25    Types: Cigarettes  . Smokeless tobacco: Never Used  .  Alcohol use No    Review of Systems Patient denies headaches, rhinorrhea, blurry vision, numbness, shortness of breath, chest pain, edema, cough, abdominal pain, nausea, vomiting, diarrhea, dysuria, fevers, rashes or hallucinations unless otherwise stated above in HPI. ____________________________________________   PHYSICAL EXAM:  VITAL SIGNS: Vitals:   02/16/17 1609 02/16/17 1700  BP: 124/79 121/79  Pulse: 99 94  Resp: 18 20  Temp: (!) 97.5 F (36.4 C)     Constitutional: encephalopathic, localizes to painful stimuli, protecting airway  Eyes: Conjunctivae are normal.  Head: contusion and ecchymosis to left lateral periorbital region, no proptosis, EOMI Nose: No congestion/rhinnorhea. Mouth/Throat: Mucous membranes are dry Neck: No stridor.  Cardiovascular: Normal rate, regular rhythm. Grossly normal heart sounds.  Good peripheral circulation. Respiratory: Normal respiratory effort.  No retractions. Lungs CTAB. Gastrointestinal: Soft and nontender. distended No abdominal bruits. No CVA tenderness. Musculoskeletal: No lower extremity tenderness nor edema.  No joint effusions. Neurologic:  Looking about room, will shout to painful stimuli and localize, exam limited 2/2 encephalopathy Skin:  Skin is warm, dry and intact. No rash noted. ____________________________________________   LABS (all labs ordered are listed, but only abnormal results are displayed)  Results for orders placed or performed during the hospital encounter of 02/16/17 (from the past 24 hour(s))  Lactic acid, plasma     Status: Abnormal   Collection Time: 02/16/17  4:14 PM  Result Value Ref Range   Lactic Acid, Venous 2.3 (HH) 0.5 - 1.9 mmol/L  Troponin I     Status: Abnormal   Collection Time: 02/16/17  4:15 PM  Result Value Ref Range   Troponin I 0.04 (HH) <0.03 ng/mL  CBC WITH DIFFERENTIAL     Status: Abnormal   Collection Time: 02/16/17  4:15 PM  Result Value Ref Range   WBC 17.6 (H) 3.8 - 10.6 K/uL    RBC 3.33 (L) 4.40 - 5.90 MIL/uL   Hemoglobin 10.0 (L) 13.0 - 18.0 g/dL   HCT 09.8 (L) 11.9 - 14.7 %   MCV 88.8 80.0 - 100.0 fL   MCH 30.0 26.0 - 34.0 pg   MCHC 33.8 32.0 - 36.0 g/dL   RDW 82.9 (H) 56.2 - 13.0 %   Platelets 398 150 - 440 K/uL   Neutrophils Relative % 95 %   Neutro Abs 16.7 (H) 1.4 - 6.5 K/uL   Lymphocytes Relative 2 %   Lymphs Abs 0.3 (L) 1.0 - 3.6 K/uL   Monocytes Relative 3 %   Monocytes Absolute 0.6 0.2 - 1.0 K/uL   Eosinophils Relative 0 %   Eosinophils Absolute 0.0 0 - 0.7 K/uL   Basophils Relative 0 %   Basophils Absolute 0.1 0 - 0.1 K/uL  Urinalysis, Complete w Microscopic     Status: Abnormal   Collection Time: 02/16/17  4:15 PM  Result Value Ref Range   Color, Urine YELLOW (A) YELLOW   APPearance CLOUDY (A) CLEAR   Specific Gravity, Urine 1.011  1.005 - 1.030   pH 5.0 5.0 - 8.0   Glucose, UA NEGATIVE NEGATIVE mg/dL   Hgb urine dipstick MODERATE (A) NEGATIVE   Bilirubin Urine NEGATIVE NEGATIVE   Ketones, ur NEGATIVE NEGATIVE mg/dL   Protein, ur 130100 (A) NEGATIVE mg/dL   Nitrite NEGATIVE NEGATIVE   Leukocytes, UA LARGE (A) NEGATIVE   RBC / HPF TOO NUMEROUS TO COUNT 0 - 5 RBC/hpf   WBC, UA TOO NUMEROUS TO COUNT 0 - 5 WBC/hpf   Bacteria, UA MANY (A) NONE SEEN   Squamous Epithelial / LPF 0-5 (A) NONE SEEN   WBC Clumps PRESENT    Mucous PRESENT   Comprehensive metabolic panel     Status: Abnormal   Collection Time: 02/16/17  4:15 PM  Result Value Ref Range   Sodium 134 (L) 135 - 145 mmol/L   Potassium UNABLE TO REPORT DUE TO HEMOLYSIS 3.5 - 5.1 mmol/L   Chloride 102 101 - 111 mmol/L   CO2 10 (L) 22 - 32 mmol/L   Glucose, Bld 143 (H) 65 - 99 mg/dL   BUN 865140 (H) 6 - 20 mg/dL   Creatinine, Ser 78.4615.49 (H) 0.61 - 1.24 mg/dL   Calcium 9.8 8.9 - 96.210.3 mg/dL   Total Protein 8.2 (H) 6.5 - 8.1 g/dL   Albumin 3.1 (L) 3.5 - 5.0 g/dL   AST UNABLE TO REPORT DUE TO HEMOLYSIS 15 - 41 U/L   ALT 18 17 - 63 U/L   Alkaline Phosphatase 114 38 - 126 U/L   Total  Bilirubin 1.5 (H) 0.3 - 1.2 mg/dL   GFR calc non Af Amer 3 (L) >60 mL/min   GFR calc Af Amer 4 (L) >60 mL/min   Anion gap 22 (H) 5 - 15  Glucose, capillary     Status: Abnormal   Collection Time: 02/16/17  4:24 PM  Result Value Ref Range   Glucose-Capillary 127 (H) 65 - 99 mg/dL  Blood gas, venous     Status: Abnormal (Preliminary result)   Collection Time: 02/16/17  4:55 PM  Result Value Ref Range   FIO2 PENDING    Delivery systems PENDING    pH, Ven 7.11 (LL) 7.250 - 7.430   pCO2, Ven 28 (L) 44.0 - 60.0 mmHg   pO2, Ven 49.0 (H) 32.0 - 45.0 mmHg   Bicarbonate 8.9 (L) 20.0 - 28.0 mmol/L   Acid-base deficit 19.2 (H) 0.0 - 2.0 mmol/L   O2 Saturation 67.7 %   Patient temperature 37.0    Collection site VEIN    Sample type VEIN   Comprehensive metabolic panel     Status: Abnormal   Collection Time: 02/16/17  5:10 PM  Result Value Ref Range   Sodium 133 (L) 135 - 145 mmol/L   Potassium 7.5 (HH) 3.5 - 5.1 mmol/L   Chloride 102 101 - 111 mmol/L   CO2 11 (L) 22 - 32 mmol/L   Glucose, Bld 137 (H) 65 - 99 mg/dL   BUN 952134 (H) 6 - 20 mg/dL   Creatinine, Ser 84.1315.14 (H) 0.61 - 1.24 mg/dL   Calcium 9.5 8.9 - 24.410.3 mg/dL   Total Protein 7.9 6.5 - 8.1 g/dL   Albumin 3.0 (L) 3.5 - 5.0 g/dL   AST 28 15 - 41 U/L   ALT 22 17 - 63 U/L   Alkaline Phosphatase 109 38 - 126 U/L   Total Bilirubin 1.0 0.3 - 1.2 mg/dL   GFR calc non Af Amer 3 (L) >60 mL/min  GFR calc Af Amer 4 (L) >60 mL/min   Anion gap 20 (H) 5 - 15   ____________________________________________  EKG My review and personal interpretation at Time: 16:10   Indication: seizure  Rate: 95  Rhythm: sinus Axis: left Other: RBBB with non specific st changes, no STEMI criteria   My review and personal interpretation at Time: 16:49   Indication: seizure  Rate: 95  Rhythm: sinus Axis: left Other: RBBB with non specific st changes, no STEMI criteria  ___________________________________________  RADIOLOGY  I personally reviewed all  radiographic images ordered to evaluate for the above acute complaints and reviewed radiology reports and findings.  These findings were personally discussed with the patient.  Please see medical record for radiology report.  ____________________________________________   PROCEDURES  Procedure(s) performed:  Procedure Name: Intubation Date/Time: 02/16/2017 7:07 PM Performed by: Willy Eddy Pre-anesthesia Checklist: Patient identified, Emergency Drugs available, Suction available, Patient being monitored and Timeout performed Oxygen Delivery Method: Ambu bag Preoxygenation: Pre-oxygenation with 100% oxygen Induction Type: Rapid sequence Ventilation: Mask ventilation without difficulty Laryngoscope Size: Glidescope and 3 Grade View: Grade I Tube size: 8.0 mm Number of attempts: 1 Airway Equipment and Method: Stylet and Video-laryngoscopy Placement Confirmation: ETT inserted through vocal cords under direct vision,  Positive ETCO2,  CO2 detector and Breath sounds checked- equal and bilateral Secured at: 24 cm Tube secured with: ETT holder Dental Injury: Teeth and Oropharynx as per pre-operative assessment     .Central Line Date/Time: 02/16/2017 7:08 PM Performed by: Willy Eddy Authorized by: Willy Eddy   Consent:    Consent obtained:  Written   Consent given by:  Parent and guardian   Risks discussed:  Arterial puncture, incorrect placement, infection, bleeding and nerve damage Pre-procedure details:    Hand hygiene: Hand hygiene performed prior to insertion     Sterile barrier technique: All elements of maximal sterile technique followed     Skin preparation:  2% chlorhexidine   Skin preparation agent: Skin preparation agent completely dried prior to procedure   Procedure details:    Location:  R femoral   Patient position:  Flat   Procedural supplies:  Double lumen   Catheter size:  12 Fr   Landmarks identified: yes     Ultrasound guidance: yes      Sterile ultrasound techniques: Sterile gel and sterile probe covers were used     Number of attempts:  1   Successful placement: yes   Post-procedure details:    Post-procedure:  Dressing applied and line sutured   Assessment:  Blood return through all ports   Patient tolerance of procedure:  Tolerated well, no immediate complications      Critical Care performed: yes CRITICAL CARE Performed by: Willy Eddy   Total critical care time: 55 minutes  Critical care time was exclusive of separately billable procedures and treating other patients.  Critical care was necessary to treat or prevent imminent or life-threatening deterioration.  Critical care was time spent personally by me on the following activities: development of treatment plan with patient and/or surrogate as well as nursing, discussions with consultants, evaluation of patient's response to treatment, examination of patient, obtaining history from patient or surrogate, ordering and performing treatments and interventions, ordering and review of laboratory studies, ordering and review of radiographic studies, pulse oximetry and re-evaluation of patient's condition.  ____________________________________________   INITIAL IMPRESSION / ASSESSMENT AND PLAN / ED COURSE  Pertinent labs & imaging results that were available during my care of the patient  were reviewed by me and considered in my medical decision making (see chart for details).  DDX: Dehydration, sepsis, pna, uti, hypoglycemia, cva, drug effect, withdrawal, encephalitis   LYDEN REDNER is a 56 y.o. who presents to the ED with fall from standing with seizure-like activity as described above. Because STEMI was called in route due to EKG showing abnormalities in aVR as well as with lateral reciprocal changes. On arrival repeat EKG shows evidence of right bundle with an abnormal QRS but I do not believe that this meets STEMI criteria. Particularly in the setting  of his trauma feel the patient needs further workup of intracranial injury. Patient otherwise hemodynamic stable and with normal oxygen saturations. Patient will be taken emergently to CT scan.  The patient will be placed on continuous pulse oximetry and telemetry for monitoring.  Laboratory evaluation will be sent to evaluate for the above complaints.     Clinical Course as of Feb 16 1909  Sun Feb 16, 2017  1655 Patient is currently following commands. Still encephalopathic with bizarre behavior. Denies any pain right now still protecting his airway. Repeat troponin is negative.  [PR]  1758 On further examination the patient has a poorly cared for Foley catheter that is open to the air with gross purulence. Bedside ultrasound showed that there is no bulb and the bladder. The Foley catheter was removed and replaced with a sterile catheter. Patient with evidence of profound acute renal failure with probable anterior with profound high anion gap metabolic acidosis. Nephrology will be called as a do believe the patient will require emergent dialysis. Patient given IV calcium and sodium bicarbonate.  [PR]    Clinical Course User Index [PR] Willy Eddy, MD    ----------------------------------------- 7:09 PM on 02/16/2017 -----------------------------------------  After successful placement of dialysis catheter the patient became more more encephalopathic in due to concern for impending airway compromise was determined in the patient's best interest to intubate to further stabilize the patient prior to dialysis. Patient has been admitted to the hospitalist group and is being taken to the ICU in critical condition for emergent dialysis. ____________________________________________   FINAL CLINICAL IMPRESSION(S) / ED DIAGNOSES  Final diagnoses:  Acute renal failure, unspecified acute renal failure type (HCC)  High anion gap metabolic acidosis  Seizure-like activity (HCC)  SIRS (systemic  inflammatory response syndrome) (HCC)      NEW MEDICATIONS STARTED DURING THIS VISIT:  New Prescriptions   No medications on file     Note:  This document was prepared using Dragon voice recognition software and may include unintentional dictation errors.    Willy Eddy, MD 02/16/17 1910

## 2017-02-16 NOTE — ED Notes (Signed)
Dr Roxan Hockeyrobinson with portable u/s to bedside. Dr Roxan Hockeyobinson removing tube with ease. Distal tip is covered with a bown colored mucous appearing substance, deflated balloon noted. With the initial assessment the appearance of the tube did not have a foley appearance due to the lack of the secondary tube consistent with the access to inflate and deflate the balloon. The tube appeared to have a clean cut across.

## 2017-02-16 NOTE — Progress Notes (Addendum)
Pharmacy Antibiotic Note  Jason ArabMichael D Diaz is a 56 y.o. male admitted on 02/16/2017 with septic shock.  Pharmacy has been consulted for vancomycin and Zosyn dosing. Patient received vancomycin 1g IV x 1 dose in ED and Zosyn 3.375 IV x 1 dose.   Addendum: Pharmacy now consulted for cefepime dosing.   Plan: Patient has severe renal failure.  Will give additional dose of Vancomycin 1g IV ( total vancomycin 2g IV) and monitor dialysis plan. Will plan to give vancomycin 1 gm IV after each HD session and check a trough level prior to 3rd HD session.   Will order cefepime 1g IV every 24 hours based on patients current renal function.   Weight: 180 lb (81.6 kg)  Temp (24hrs), Avg:97.5 F (36.4 C), Min:97.5 F (36.4 C), Max:97.5 F (36.4 C)   Recent Labs Lab 02/16/17 1614 02/16/17 1615 02/16/17 1710  WBC  --  17.6*  --   CREATININE  --  15.49* 15.14*  LATICACIDVEN 2.3*  --   --     Estimated Creatinine Clearance: 5.5 mL/min (A) (by C-G formula based on SCr of 15.14 mg/dL (H)).    No Known Allergies  Antimicrobials this admission: 07/15 Zosyn  >> 7/15 07/15 vancomycin >  07/15 Cefepime    Dose adjustments this admission:  Microbiology results: 7/15 QIO:NGEXBCx:sent  UCx: sent  MRSA PCR:   Thank you for allowing pharmacy to be a part of this patient's care.  Gardner CandleSheema M Uzziel Russey, PharmD, BCPS Clinical Pharmacist 02/16/2017 7:57 PM

## 2017-02-16 NOTE — H&P (Signed)
[facility] [place] [facility]   PATIENT NAME: Jason Diaz    MR#:  [id number]  DATE OF BIRTH:  Jun 13, 1961  DATE OF ADMISSION:  02/16/2017  PRIMARY CARE PHYSICIAN: Jason Gang, FNP   REQUESTING/REFERRING PHYSICIAN: Dr. Roxan Hockey  CHIEF COMPLAINT:   Patient found unresponsive at nursing home HISTORY OF PRESENT ILLNESS:  Jason Diaz  is a 56 y.o. male with a known history of Chronic kidney disease stage IV baseline creatinine around 3.4, GERD, schizoaffective disorder., Hypertension comes to the emergency room from [facility] healthcare after patient slipping in his urine causing him to fall and hitting his head on the nightstand. Staff noted shaking questionable seizure received 4 mg of Versed. Patient was quite confused came to the emergency room and was found to be in septic shock. His white count was elevated to 17,000 was tachycardic and creatinine was 15.4 with potassium of 7.5. Patient is unable to provide any history of previous system. No family member present in the emergency room.  He currently is getting out of vas catheter placed for urgent hemodialysis. Patient will be intubated placed on the ventilator. IV vancomycin and Zosyn ordered. Received a dose of IV Keppra.  PAST MEDICAL HISTORY:   Past Medical History:  Diagnosis Date  . CKD (chronic kidney disease), stage IV (HCC)   . COPD (chronic obstructive pulmonary disease) (HCC)   . GERD (gastroesophageal reflux disease)   . Hypertension   . Multinodular goiter   . Schizoaffective disorder (HCC)     PAST SURGICAL HISTOIRY:   Past Surgical History:  Procedure Laterality Date  . TONSILLECTOMY    . TOOTH EXTRACTION      SOCIAL HISTORY:   Social History  Substance Use Topics  . Smoking status: Current Every Day Smoker    Packs/day: 0.25    Types: Cigarettes  . Smokeless tobacco: Never Used  . Alcohol use No    FAMILY HISTORY:   Family History  Problem Relation Age  of Onset  . Kidney disease Father   . Diabetes Father     DRUG ALLERGIES:  No Known Allergies  REVIEW OF SYSTEMS:  Review of Systems  Unable to perform ROS: Patient unresponsive     MEDICATIONS AT HOME:   Prior to Admission medications   Medication Sig Start Date End Date Taking? Authorizing Provider  amLODipine (NORVASC) 5 MG tablet Take 5 mg by mouth daily. 01/03/17  Yes [provider]  Cholecalciferol (VITAMIN D3) 5000 units TABS Take 5,000 Units by mouth daily.   Yes [provider]  ezetimibe (ZETIA) 10 MG tablet Take 10 mg by mouth daily. 01/03/17  Yes [provider]  fluPHENAZine (PROLIXIN) 5 MG tablet Take 5 mg by mouth at bedtime as needed.  01/03/17  Yes [provider]  furosemide (LASIX) 40 MG tablet Take 40 mg by mouth daily. 01/03/17  Yes [provider]  gemfibrozil (LOPID) 600 MG tablet Take 600 mg by mouth 2 (two) times daily. 01/03/17  Yes [provider]  metoprolol succinate (TOPROL-XL) 100 MG 24 hr tablet Take 100 mg by mouth daily. 01/03/17  Yes [provider]  pantoprazole (PROTONIX) 40 MG tablet Take 40 mg by mouth daily. 01/03/17  Yes [provider]  polyethylene glycol (MIRALAX / GLYCOLAX) packet Take 17 g by mouth daily. 01/28/17  Yes Katharina Caper, MD  potassium chloride (K-DUR) 10 MEQ tablet Take 10 mEq by mouth daily. 01/03/17  Yes [provider]  pravastatin (PRAVACHOL)  40 MG tablet Take 40 mg by mouth daily. 01/03/17  Yes [provider]  QUEtiapine (SEROQUEL) 400 MG tablet Take 200-400 mg by mouth See admin instructions. tk 0.5 tab qam and 0.5 tab at noon, then take one tab qhs 01/03/17  Yes [provider]  risperidone (RISPERDAL) 4 MG tablet Take 4 mg by mouth 2 (two) times daily. 01/03/17  Yes [provider]  tamsulosin (FLOMAX) 0.4 MG CAPS capsule Take 1 capsule (0.4 mg total) by mouth daily. 01/28/17  Yes Katharina Caper, MD  Tiotropium Bromide Monohydrate  (SPIRIVA RESPIMAT) 2.5 MCG/ACT AERS Inhale 2 puffs into the lungs daily.   Yes [provider]  traZODone (DESYREL) 150 MG tablet Take 300 mg by mouth at bedtime as needed.  01/03/17  Yes [provider]      VITAL SIGNS:  Blood pressure 121/79, pulse 94, temperature (!) 97.5 F (36.4 C), temperature source Axillary, resp. rate 20, weight 81.6 kg (180 lb), SpO2 100 %.  PHYSICAL EXAMINATION:  GENERAL:  56 y.o.-year-old patient lying in the bed with no acute distress. Critically ill EYES: Pupils equal, round, reactive to light and accommodation. No scleral icterus. Extraocular muscles intact.  HEENT: Head atraumatic, normocephalic. Oropharynx and nasopharynx clear. Left eye scleral congestion NECK:  Supple, no jugular venous distention. No thyroid enlargement, no tenderness.  LUNGS: Normal breath sounds bilaterally, no wheezing, rales,rhonchi or crepitation. No use of accessory muscles of respiration.  CARDIOVASCULAR: S1, S2 normal. No murmurs, rubs, or gallops. Tachycardic ABDOMEN: Soft, nontender, nondistended. Bowel sounds present. No organomegaly or mass.  EXTREMITIES: No pedal edema, cyanosis, or clubbing.  NEUROLOGIC: Unable to assess patient is unresponsive  PSYCHIATRIC: Unresponsive  SKIN: No obvious rash, lesion, or ulcer.   LABORATORY PANEL:   CBC  Recent Labs Lab 02/16/17 1615  WBC 17.6*  HGB 10.0*  HCT 29.6*  PLT 398   ------------------------------------------------------------------------------------------------------------------  Chemistries   Recent Labs Lab 02/16/17 1710  NA 133*  K 7.5*  CL 102  CO2 11*  GLUCOSE 137*  BUN 134*  CREATININE 15.14*  CALCIUM 9.5  AST 28  ALT 22  ALKPHOS 109  BILITOT 1.0   ------------------------------------------------------------------------------------------------------------------  Cardiac Enzymes  Recent Labs Lab 02/16/17 1615  TROPONINI 0.04*    ------------------------------------------------------------------------------------------------------------------  RADIOLOGY:  Ct Head Wo Contrast  Result Date: 02/16/2017 CLINICAL DATA:  Fall. Hit head on night stand. Seizure. Persistent confusion. EXAM: CT HEAD WITHOUT CONTRAST CT MAXILLOFACIAL WITHOUT CONTRAST CT CERVICAL SPINE WITHOUT CONTRAST TECHNIQUE: Multidetector CT imaging of the head, cervical spine, and maxillofacial structures were performed using the standard protocol without intravenous contrast. Multiplanar CT image reconstructions of the cervical spine and maxillofacial structures were also generated. COMPARISON:  None. FINDINGS: CT HEAD FINDINGS Brain: Mild generalized atrophy is present. No acute infarct, hemorrhage, or mass lesion is present. The ventricles are of normal size. No significant extraaxial fluid collection is present. The brainstem and cerebellum are normal. The internal auditory canals are within normal limits. Vascular: No hyperdense vessel or unexpected calcification. Skull: Left periorbital soft tissue swelling is noted. There is no underlying fracture. The calvarium is intact. No significant extracranial soft tissue injury is present otherwise. CT MAXILLOFACIAL FINDINGS Osseous: No acute fracture is present. No focal lytic or blastic lesions are present. Orbits: Left periorbital soft tissue swelling is present. The globes and orbits are within normal limits. No acute fracture is present. Sinuses: The paranasal sinuses and mastoid air cells are clear. Soft tissues: Soft tissues of the face are otherwise unremarkable.  No additional soft tissue injury is evident. CT CERVICAL SPINE FINDINGS Alignment: AP alignment is anatomic. Mild rightward curvature is present. There is straightening and slight reversal of the normal cervical lordosis. Skull base and vertebrae: The skullbase is within normal limits. Vertebral body heights are maintained. No acute fracture is present.  Soft tissues and spinal canal: The soft tissues of the neck demonstrate bilateral enlargement of the thyroid. No discrete mass lesion is present. There is no significant adenopathy. No other focal soft tissue abnormality is present. Disc levels:  No significant focal stenosis is evident. Upper chest: The visualized lung apices are clear. IMPRESSION: 1. Left periorbital soft tissue swelling without underlying fracture. 2. No acute intracranial abnormality. 3. No other significant facial injury. 4. Straightening and slight reversal of the normal cervical lordosis without evidence for acute fracture or traumatic subluxation in the cervical spine. 5. Enlarged thyroid gland without discrete lesion. Electronically Signed   By: Marin Roberts M.D.   On: 02/16/2017 16:58   Ct Cervical Spine Wo Contrast  Result Date: 02/16/2017 CLINICAL DATA:  Fall. Hit head on night stand. Seizure. Persistent confusion. EXAM: CT HEAD WITHOUT CONTRAST CT MAXILLOFACIAL WITHOUT CONTRAST CT CERVICAL SPINE WITHOUT CONTRAST TECHNIQUE: Multidetector CT imaging of the head, cervical spine, and maxillofacial structures were performed using the standard protocol without intravenous contrast. Multiplanar CT image reconstructions of the cervical spine and maxillofacial structures were also generated. COMPARISON:  None. FINDINGS: CT HEAD FINDINGS Brain: Mild generalized atrophy is present. No acute infarct, hemorrhage, or mass lesion is present. The ventricles are of normal size. No significant extraaxial fluid collection is present. The brainstem and cerebellum are normal. The internal auditory canals are within normal limits. Vascular: No hyperdense vessel or unexpected calcification. Skull: Left periorbital soft tissue swelling is noted. There is no underlying fracture. The calvarium is intact. No significant extracranial soft tissue injury is present otherwise. CT MAXILLOFACIAL FINDINGS Osseous: No acute fracture is present. No focal lytic  or blastic lesions are present. Orbits: Left periorbital soft tissue swelling is present. The globes and orbits are within normal limits. No acute fracture is present. Sinuses: The paranasal sinuses and mastoid air cells are clear. Soft tissues: Soft tissues of the face are otherwise unremarkable. No additional soft tissue injury is evident. CT CERVICAL SPINE FINDINGS Alignment: AP alignment is anatomic. Mild rightward curvature is present. There is straightening and slight reversal of the normal cervical lordosis. Skull base and vertebrae: The skullbase is within normal limits. Vertebral body heights are maintained. No acute fracture is present. Soft tissues and spinal canal: The soft tissues of the neck demonstrate bilateral enlargement of the thyroid. No discrete mass lesion is present. There is no significant adenopathy. No other focal soft tissue abnormality is present. Disc levels:  No significant focal stenosis is evident. Upper chest: The visualized lung apices are clear. IMPRESSION: 1. Left periorbital soft tissue swelling without underlying fracture. 2. No acute intracranial abnormality. 3. No other significant facial injury. 4. Straightening and slight reversal of the normal cervical lordosis without evidence for acute fracture or traumatic subluxation in the cervical spine. 5. Enlarged thyroid gland without discrete lesion. Electronically Signed   By: Marin Roberts M.D.   On: 02/16/2017 16:58   Dg Chest Port 1 View  Result Date: 02/16/2017 CLINICAL DATA:  Recent seizure and fall, initial encounter EXAM: PORTABLE CHEST 1 VIEW COMPARISON:  None. FINDINGS: Cardiac shadow is within normal limits. The lungs are well aerated bilaterally. No acute bony abnormality is seen. IMPRESSION:  No acute abnormality noted. Electronically Signed   By: Alcide Clever M.D.   On: 02/16/2017 17:11   Ct Maxillofacial Wo Contrast  Result Date: 02/16/2017 CLINICAL DATA:  Fall. Hit head on night stand. Seizure.  Persistent confusion. EXAM: CT HEAD WITHOUT CONTRAST CT MAXILLOFACIAL WITHOUT CONTRAST CT CERVICAL SPINE WITHOUT CONTRAST TECHNIQUE: Multidetector CT imaging of the head, cervical spine, and maxillofacial structures were performed using the standard protocol without intravenous contrast. Multiplanar CT image reconstructions of the cervical spine and maxillofacial structures were also generated. COMPARISON:  None. FINDINGS: CT HEAD FINDINGS Brain: Mild generalized atrophy is present. No acute infarct, hemorrhage, or mass lesion is present. The ventricles are of normal size. No significant extraaxial fluid collection is present. The brainstem and cerebellum are normal. The internal auditory canals are within normal limits. Vascular: No hyperdense vessel or unexpected calcification. Skull: Left periorbital soft tissue swelling is noted. There is no underlying fracture. The calvarium is intact. No significant extracranial soft tissue injury is present otherwise. CT MAXILLOFACIAL FINDINGS Osseous: No acute fracture is present. No focal lytic or blastic lesions are present. Orbits: Left periorbital soft tissue swelling is present. The globes and orbits are within normal limits. No acute fracture is present. Sinuses: The paranasal sinuses and mastoid air cells are clear. Soft tissues: Soft tissues of the face are otherwise unremarkable. No additional soft tissue injury is evident. CT CERVICAL SPINE FINDINGS Alignment: AP alignment is anatomic. Mild rightward curvature is present. There is straightening and slight reversal of the normal cervical lordosis. Skull base and vertebrae: The skullbase is within normal limits. Vertebral body heights are maintained. No acute fracture is present. Soft tissues and spinal canal: The soft tissues of the neck demonstrate bilateral enlargement of the thyroid. No discrete mass lesion is present. There is no significant adenopathy. No other focal soft tissue abnormality is present. Disc  levels:  No significant focal stenosis is evident. Upper chest: The visualized lung apices are clear. IMPRESSION: 1. Left periorbital soft tissue swelling without underlying fracture. 2. No acute intracranial abnormality. 3. No other significant facial injury. 4. Straightening and slight reversal of the normal cervical lordosis without evidence for acute fracture or traumatic subluxation in the cervical spine. 5. Enlarged thyroid gland without discrete lesion. Electronically Signed   By: Marin Roberts M.D.   On: 02/16/2017 16:58    EKG:   Sinus tachycardia.. Right bundle branch block with left anterior fascicular block. IMPRESSION AND PLAN:   Jason Diaz  is a 56 y.o. male with a known history of Chronic kidney disease stage IV baseline creatinine around 3.4, GERD, schizoaffective disorder., Hypertension comes to the emergency room from [facility] healthcare after patient slipping in his urine causing him to fall and hitting his head on the nightstand. Staff noted shaking questionable seizure received 4 mg of Versed. Patient was quite confused came to the emergency room and was found to be in septic shock. His white count was elevated to 17,000 was tachycardic and creatinine was 15.4 with potassium of 7.5.  1. Acute metabolic encephalopathy multifactorial secondary to septic shock, acute on chronic severe renal failure with dehydration -Admit to ICU -Patient will be intubated placed on the ventilator -Informed Dr. Katrinka Blazing ICU attending -IV fluids -IV pressors if needed -Continue to monitor for seizure activity. It seems patient is encephalopathy from uremia and likely causes shaking/tremors -Consider neurology consultation if needed  2. Septic shock source appears urine -Patient was discharged 01/28/2017 with an indwelling Foley catheter given significant hematuria and difficulty  urination. He was supposed to follow with urology not sure if he made to the appointment -Patient came in with  a Foley catheter that was deeply and that it had to be cut off with very turbid urine -IV Zosyn and vancomycin -Follow up urine culture and blood culture  3. Acute on chronic kidney failure stage IV/5 -Baseline creatinine 3.35------ came in with creatinine of 15.49--- emergent CRRT or hemodialysis treatment initiated to be initiated -Nephrology consultation placed with Dr. Thedore MinsSingh  4. Acute severe hyperkalemia -Patient received IV bicarbonate, dextrose with insulin and calcium gluconate -Follow up potassium with dialysis  5. Leukocytosis due to #2  6. DVT prophylaxis subcutaneous heparin  No family members in the room will attempt to call them Discussed with Dr. Roxan Hockeyobinson and Dr. Thedore MinsSingh  All the records are reviewed and case discussed with ED provider. Management plans discussed with the patient, family and they are in agreement.  CODE STATUS: Full  TOTAL CRITICAL TIME TAKING CARE OF THIS PATIENT: 55 minutes.    Jason Diaz M.D on 02/16/2017 at 6:56 PM  Between 7am to 6pm - Pager - [phone]  After 6pm go to [email or web address] - password [email or web address] [facility]  [facility]  Office  [phone]  CC: Primary care physician; Jason GangLindley, Jason P, FNP

## 2017-02-16 NOTE — Consult Note (Signed)
PULMONARY / CRITICAL CARE MEDICINE   Name: Jason Diaz MRN: [id number] DOB: 30-Dec-1960    ADMISSION DATE:  02/16/2017   CONSULTATION DATE:  02/16/2017  REFERRING MD:  Dr Nemiah Commander  Reason: Acute respiratory failure and new onset seizures  CHIEF COMPLAINT: Fall with head injury and seizures  HISTORY OF PRESENT ILLNESS:   This is a 56 year old African-American male with a past medical history as indicated below presented from a skilled nursing facility via EMS following a witnessed fall and seizure. History is obtained from ED, and EMS records as patient is currently intubated and sedated. EMS was called because patient slipped and fell in his urine and hit his head on the nightstand. He then had a seizure after that that lasted about 7 minutes. He was given 4 mg of Versed IM. At the ED, patient appeared to be having some intermittent twitching. He emergently intubated for worsening mental status. His CT head was negative for acute bleed but showed some soft tissue swelling and hematoma on the right orbital region. His labs showed a potassium level later than 7 and a creatinine level greater than 15. He has baseline CKD but was not on dialysis. He is being admitted to the ICU for management of acute respiratory failure and for emergent dialysis.  PAST MEDICAL HISTORY :  He  has a past medical history of CKD (chronic kidney disease), stage IV (HCC); COPD (chronic obstructive pulmonary disease) (HCC); GERD (gastroesophageal reflux disease); Hypertension; Multinodular goiter; and Schizoaffective disorder (HCC).  PAST SURGICAL HISTORY: He  has a past surgical history that includes Tonsillectomy and Tooth extraction.  No Known Allergies  No current facility-administered medications on file prior to encounter.    Current Outpatient Prescriptions on File Prior to Encounter  Medication Sig  . amLODipine (NORVASC) 5 MG tablet Take 5 mg by mouth daily.  . Cholecalciferol (VITAMIN D3) 5000 units  TABS Take 5,000 Units by mouth daily.  Marland Kitchen ezetimibe (ZETIA) 10 MG tablet Take 10 mg by mouth daily.  . fluPHENAZine (PROLIXIN) 5 MG tablet Take 5 mg by mouth at bedtime as needed.   . furosemide (LASIX) 40 MG tablet Take 40 mg by mouth daily.  Marland Kitchen gemfibrozil (LOPID) 600 MG tablet Take 600 mg by mouth 2 (two) times daily.  . metoprolol succinate (TOPROL-XL) 100 MG 24 hr tablet Take 100 mg by mouth daily.  . pantoprazole (PROTONIX) 40 MG tablet Take 40 mg by mouth daily.  . polyethylene glycol (MIRALAX / GLYCOLAX) packet Take 17 g by mouth daily.  . potassium chloride (K-DUR) 10 MEQ tablet Take 10 mEq by mouth daily.  . pravastatin (PRAVACHOL) 40 MG tablet Take 40 mg by mouth daily.  . QUEtiapine (SEROQUEL) 400 MG tablet Take 200-400 mg by mouth See admin instructions. tk 0.5 tab qam and 0.5 tab at noon, then take one tab qhs  . risperidone (RISPERDAL) 4 MG tablet Take 4 mg by mouth 2 (two) times daily.  . tamsulosin (FLOMAX) 0.4 MG CAPS capsule Take 1 capsule (0.4 mg total) by mouth daily.  . Tiotropium Bromide Monohydrate (SPIRIVA RESPIMAT) 2.5 MCG/ACT AERS Inhale 2 puffs into the lungs daily.  . traZODone (DESYREL) 150 MG tablet Take 300 mg by mouth at bedtime as needed.     FAMILY HISTORY:  His indicated that his mother is alive. He indicated that his father is alive.    SOCIAL HISTORY: He  reports that he has been smoking Cigarettes.  He has been smoking about 0.25 packs per  day. He has never used smokeless tobacco. He reports that he does not drink alcohol or use drugs.  REVIEW OF SYSTEMS:   Unable to obtain  SUBJECTIVE:   VITAL SIGNS: BP 120/81   Pulse 85   Temp (!) 97.5 F (36.4 C) (Axillary)   Resp (!) 25   Wt 180 lb (81.6 kg)   SpO2 100%   BMI 29.95 kg/m   HEMODYNAMICS:    VENTILATOR SETTINGS: Vent Mode: AC FiO2 (%):  [40 %] 40 % Set Rate:  [16 bmp] 16 bmp Vt Set:  [500 mL] 500 mL PEEP:  [5 cmH20] 5 cmH20  INTAKE / OUTPUT: No intake/output data  recorded.  PHYSICAL EXAMINATION: General:  Chronically ill-looking, older for age Neuro:  Unresponsive to voice, touch and noxious stimulus, no corneal reflexes, no gag reflex HEENT:  Exophthalmus, Pupils are equal, dilated, left eye with small scleral abrasion and scleral erythema Cardiovascular:  Apical pulse palpable, regular, S1, S2 audible, no murmur, regurg or gallop, +2 pulses bilaterally, no edema Lungs:  Bilateral breath sounds, no wheezing and no rhonchi Abdomen:  Nondistended, normal bowel sounds in all 4 quadrants, palpation reveals no organomegaly Musculoskeletal:  No deformities, positive range of motion in upper and lower extremity Skin:  Skin is dry  LABS:  BMET  Recent Labs Lab 02/16/17 1615 02/16/17 1710  NA 134* 133*  K UNABLE TO REPORT DUE TO HEMOLYSIS 7.5*  CL 102 102  CO2 10* 11*  BUN 140* 134*  CREATININE 15.49* 15.14*  GLUCOSE 143* 137*    Electrolytes  Recent Labs Lab 02/16/17 1615 02/16/17 1710  CALCIUM 9.8 9.5    CBC  Recent Labs Lab 02/16/17 1615  WBC 17.6*  HGB 10.0*  HCT 29.6*  PLT 398    Coag's No results for input(s): APTT, INR in the last 168 hours.  Sepsis Markers  Recent Labs Lab 02/16/17 1614  LATICACIDVEN 2.3*    ABG No results for input(s): PHART, PCO2ART, PO2ART in the last 168 hours.  Liver Enzymes  Recent Labs Lab 02/16/17 1615 02/16/17 1710  AST UNABLE TO REPORT DUE TO HEMOLYSIS 28  ALT 18 22  ALKPHOS 114 109  BILITOT 1.5* 1.0  ALBUMIN 3.1* 3.0*    Cardiac Enzymes  Recent Labs Lab 02/16/17 1615  TROPONINI 0.04*    Glucose  Recent Labs Lab 02/16/17 1624  GLUCAP 127*    Imaging Ct Head Wo Contrast  Result Date: 02/16/2017 CLINICAL DATA:  Fall. Hit head on night stand. Seizure. Persistent confusion. EXAM: CT HEAD WITHOUT CONTRAST CT MAXILLOFACIAL WITHOUT CONTRAST CT CERVICAL SPINE WITHOUT CONTRAST TECHNIQUE: Multidetector CT imaging of the head, cervical spine, and maxillofacial  structures were performed using the standard protocol without intravenous contrast. Multiplanar CT image reconstructions of the cervical spine and maxillofacial structures were also generated. COMPARISON:  None. FINDINGS: CT HEAD FINDINGS Brain: Mild generalized atrophy is present. No acute infarct, hemorrhage, or mass lesion is present. The ventricles are of normal size. No significant extraaxial fluid collection is present. The brainstem and cerebellum are normal. The internal auditory canals are within normal limits. Vascular: No hyperdense vessel or unexpected calcification. Skull: Left periorbital soft tissue swelling is noted. There is no underlying fracture. The calvarium is intact. No significant extracranial soft tissue injury is present otherwise. CT MAXILLOFACIAL FINDINGS Osseous: No acute fracture is present. No focal lytic or blastic lesions are present. Orbits: Left periorbital soft tissue swelling is present. The globes and orbits are within normal limits. No acute fracture is  present. Sinuses: The paranasal sinuses and mastoid air cells are clear. Soft tissues: Soft tissues of the face are otherwise unremarkable. No additional soft tissue injury is evident. CT CERVICAL SPINE FINDINGS Alignment: AP alignment is anatomic. Mild rightward curvature is present. There is straightening and slight reversal of the normal cervical lordosis. Skull base and vertebrae: The skullbase is within normal limits. Vertebral body heights are maintained. No acute fracture is present. Soft tissues and spinal canal: The soft tissues of the neck demonstrate bilateral enlargement of the thyroid. No discrete mass lesion is present. There is no significant adenopathy. No other focal soft tissue abnormality is present. Disc levels:  No significant focal stenosis is evident. Upper chest: The visualized lung apices are clear. IMPRESSION: 1. Left periorbital soft tissue swelling without underlying fracture. 2. No acute intracranial  abnormality. 3. No other significant facial injury. 4. Straightening and slight reversal of the normal cervical lordosis without evidence for acute fracture or traumatic subluxation in the cervical spine. 5. Enlarged thyroid gland without discrete lesion. Electronically Signed   By: Marin Roberts M.D.   On: 02/16/2017 16:58   Ct Cervical Spine Wo Contrast  Result Date: 02/16/2017 CLINICAL DATA:  Fall. Hit head on night stand. Seizure. Persistent confusion. EXAM: CT HEAD WITHOUT CONTRAST CT MAXILLOFACIAL WITHOUT CONTRAST CT CERVICAL SPINE WITHOUT CONTRAST TECHNIQUE: Multidetector CT imaging of the head, cervical spine, and maxillofacial structures were performed using the standard protocol without intravenous contrast. Multiplanar CT image reconstructions of the cervical spine and maxillofacial structures were also generated. COMPARISON:  None. FINDINGS: CT HEAD FINDINGS Brain: Mild generalized atrophy is present. No acute infarct, hemorrhage, or mass lesion is present. The ventricles are of normal size. No significant extraaxial fluid collection is present. The brainstem and cerebellum are normal. The internal auditory canals are within normal limits. Vascular: No hyperdense vessel or unexpected calcification. Skull: Left periorbital soft tissue swelling is noted. There is no underlying fracture. The calvarium is intact. No significant extracranial soft tissue injury is present otherwise. CT MAXILLOFACIAL FINDINGS Osseous: No acute fracture is present. No focal lytic or blastic lesions are present. Orbits: Left periorbital soft tissue swelling is present. The globes and orbits are within normal limits. No acute fracture is present. Sinuses: The paranasal sinuses and mastoid air cells are clear. Soft tissues: Soft tissues of the face are otherwise unremarkable. No additional soft tissue injury is evident. CT CERVICAL SPINE FINDINGS Alignment: AP alignment is anatomic. Mild rightward curvature is present.  There is straightening and slight reversal of the normal cervical lordosis. Skull base and vertebrae: The skullbase is within normal limits. Vertebral body heights are maintained. No acute fracture is present. Soft tissues and spinal canal: The soft tissues of the neck demonstrate bilateral enlargement of the thyroid. No discrete mass lesion is present. There is no significant adenopathy. No other focal soft tissue abnormality is present. Disc levels:  No significant focal stenosis is evident. Upper chest: The visualized lung apices are clear. IMPRESSION: 1. Left periorbital soft tissue swelling without underlying fracture. 2. No acute intracranial abnormality. 3. No other significant facial injury. 4. Straightening and slight reversal of the normal cervical lordosis without evidence for acute fracture or traumatic subluxation in the cervical spine. 5. Enlarged thyroid gland without discrete lesion. Electronically Signed   By: Marin Roberts M.D.   On: 02/16/2017 16:58   Dg Chest Portable 1 View  Result Date: 02/16/2017 CLINICAL DATA:  56 year old male status post intubation. EXAM: PORTABLE CHEST 1 VIEW COMPARISON:  Chest  x-ray a 02/16/2017. FINDINGS: An endotracheal tube is in place with tip 2.8 cm above the carina. A nasogastric tube is seen extending into the stomach, however, the tip of the nasogastric tube extends below the lower margin of the image. Lung volumes are low. No consolidative airspace disease. No pleural effusions. No pneumothorax. No suspicious appearing pulmonary nodules or masses. Heart size is normal. Upper mediastinal contours are within normal limits. IMPRESSION: 1. Support apparatus, as above. 2. Low lung volumes without radiographic evidence of acute cardiopulmonary disease. Electronically Signed   By: Trudie Reed M.D.   On: 02/16/2017 19:29   Dg Chest Port 1 View  Result Date: 02/16/2017 CLINICAL DATA:  Recent seizure and fall, initial encounter EXAM: PORTABLE CHEST 1  VIEW COMPARISON:  None. FINDINGS: Cardiac shadow is within normal limits. The lungs are well aerated bilaterally. No acute bony abnormality is seen. IMPRESSION: No acute abnormality noted. Electronically Signed   By: Alcide Clever M.D.   On: 02/16/2017 17:11   Ct Maxillofacial Wo Contrast  Result Date: 02/16/2017 CLINICAL DATA:  Fall. Hit head on night stand. Seizure. Persistent confusion. EXAM: CT HEAD WITHOUT CONTRAST CT MAXILLOFACIAL WITHOUT CONTRAST CT CERVICAL SPINE WITHOUT CONTRAST TECHNIQUE: Multidetector CT imaging of the head, cervical spine, and maxillofacial structures were performed using the standard protocol without intravenous contrast. Multiplanar CT image reconstructions of the cervical spine and maxillofacial structures were also generated. COMPARISON:  None. FINDINGS: CT HEAD FINDINGS Brain: Mild generalized atrophy is present. No acute infarct, hemorrhage, or mass lesion is present. The ventricles are of normal size. No significant extraaxial fluid collection is present. The brainstem and cerebellum are normal. The internal auditory canals are within normal limits. Vascular: No hyperdense vessel or unexpected calcification. Skull: Left periorbital soft tissue swelling is noted. There is no underlying fracture. The calvarium is intact. No significant extracranial soft tissue injury is present otherwise. CT MAXILLOFACIAL FINDINGS Osseous: No acute fracture is present. No focal lytic or blastic lesions are present. Orbits: Left periorbital soft tissue swelling is present. The globes and orbits are within normal limits. No acute fracture is present. Sinuses: The paranasal sinuses and mastoid air cells are clear. Soft tissues: Soft tissues of the face are otherwise unremarkable. No additional soft tissue injury is evident. CT CERVICAL SPINE FINDINGS Alignment: AP alignment is anatomic. Mild rightward curvature is present. There is straightening and slight reversal of the normal cervical lordosis.  Skull base and vertebrae: The skullbase is within normal limits. Vertebral body heights are maintained. No acute fracture is present. Soft tissues and spinal canal: The soft tissues of the neck demonstrate bilateral enlargement of the thyroid. No discrete mass lesion is present. There is no significant adenopathy. No other focal soft tissue abnormality is present. Disc levels:  No significant focal stenosis is evident. Upper chest: The visualized lung apices are clear. IMPRESSION: 1. Left periorbital soft tissue swelling without underlying fracture. 2. No acute intracranial abnormality. 3. No other significant facial injury. 4. Straightening and slight reversal of the normal cervical lordosis without evidence for acute fracture or traumatic subluxation in the cervical spine. 5. Enlarged thyroid gland without discrete lesion. Electronically Signed   By: Marin Roberts M.D.   On: 02/16/2017 16:58     STUDIES:  EEG other  CULTURES:  Blood cultures 2. Urine cultures  ANTIBIOTICS:  Vancomycin 02/16/2017- Zosyn 02/16/2017 to 02/16/2017 Cefepime 02/16/2017-  SIGNIFICANT EVENTS: 02/16/2017: ED with seizure, failed head injury from fall, and acute respiratory failure  LINES/TUBES:  Peripheral IVs Right  femoral HD catheter  DISCUSSION:  56 year old male with an extensive psychiatric history, presenting with acute hypoxemic respiratory failure, end-stage renal disease necessitating hemodialysis, severe hyperkalemia, metabolic acidosis, sepsis and new onset seizures  ASSESSMENT Acute hypoxemic respiratory failure Severe hyperkalemia  Acute on chronic renal failure-Baseline creatinine 3.35; now 15.14 Metabolic acidosis with anion gap of 20 New onset seizures-trauma versus severe uremia Sepsis of unknown origin  Plan Full vent support with current settings Chest x-ray and ABG postintubation reviewed Propofol and prn versed for vent sedation VAP protocol Weaning trials as  tolerated. Nephrology consulted for emergent dialysis HD tonight Bicarb infusion Monitor and correct electrolyte imbalances. Trend BMP Pro-calcitonin level Keppra  Antibiotics as above. Follow-up cultures IV fluids Strict I and O monitoring GI and DVT prophylaxis Further changes in treatment plan pending clinical course and diagnostics   FAMILY  - Updates:  No family at bedside. Mother updated by nursing  - Inter-disciplinary family meet or Palliative Care meeting due by:  day 7   [facility]a [facility] ANP-BC Pulmonary and Critical Care Medicine [facility] Pager [phone] or [phone]  02/16/2017, 7:40 PM

## 2017-02-16 NOTE — ED Notes (Signed)
PT intubated by dr Roxan Hockeyrobinson at [phone] with glidescope and resp at bedside, pt intubated with ease with an 8.0 et tube, 24 at the lip, positive color change

## 2017-02-16 NOTE — ED Triage Notes (Signed)
[facility] called 911 due to pt slipping in his urine causing him to fall hitting his head on the night stand, pt was reported to have a seizure and was given 4mg  of versed IM by ems. Pt no longer seizing at this time, pt alert, however appears confused, pt attempting to talk to care providers but is mumbiling, pt is reported to not have a hx of seizures. Ems concerned about his ekg with noting elevation in V2 with reciprocal changes in lead 2. Also noted that pt has redness to the left sclera as well as an approx 2mm blood spot above the left pupil. Pt will look around but doesn't appear to be focusing on anything, pupil response sluggish bilat

## 2017-02-16 NOTE — ED Notes (Signed)
Attempt made to obtain a urine specimen by assisting pt with a urinal, upon removal of the patient's pants it was noted that the patient had a clear, hard plastic tube protruding from the pt's penis. Dr Roxan Hockeyobinson called to bedside to assess.

## 2017-02-16 NOTE — ED Notes (Signed)
Back to exam room with pt from ct, pt appears to be intermittently twitching, dr Roxan Hockeyrobinson made aware

## 2017-02-16 NOTE — ED Notes (Signed)
14 french foley placed without difficulty, with cloudy urine returned, pt tol well

## 2017-02-16 NOTE — Progress Notes (Signed)
Chaplin received a Page Code Code Stemi. Then the page Was canceled.

## 2017-02-16 NOTE — ED Notes (Signed)
16 french NG tube placed at the 65 mark without difficulty

## 2017-02-16 NOTE — ED Notes (Signed)
Dr Roxan Hockeyrobinson at bedside for re-assessment and to speak with family about emergent dialysis

## 2017-02-16 NOTE — ED Notes (Signed)
Pt covered with warm blankets. 

## 2017-02-16 NOTE — ED Notes (Signed)
**  CVC Double Lumen is a DUOGLIDE Short-Term Straight Dialysis Catheter for Emergency Dialysis.

## 2017-02-16 NOTE — Progress Notes (Signed)
HD STARTED  

## 2017-02-16 NOTE — ED Notes (Signed)
Report given for ccu 6

## 2017-02-17 ENCOUNTER — Inpatient Hospital Stay: Payer: Medicare Other

## 2017-02-17 ENCOUNTER — Inpatient Hospital Stay (HOSPITAL_COMMUNITY): Payer: Medicare Other

## 2017-02-17 DIAGNOSIS — J9600 Acute respiratory failure, unspecified whether with hypoxia or hypercapnia: Secondary | ICD-10-CM

## 2017-02-17 DIAGNOSIS — A419 Sepsis, unspecified organism: Secondary | ICD-10-CM

## 2017-02-17 DIAGNOSIS — R569 Unspecified convulsions: Secondary | ICD-10-CM

## 2017-02-17 DIAGNOSIS — J9601 Acute respiratory failure with hypoxia: Secondary | ICD-10-CM

## 2017-02-17 DIAGNOSIS — R259 Unspecified abnormal involuntary movements: Secondary | ICD-10-CM

## 2017-02-17 DIAGNOSIS — R6521 Severe sepsis with septic shock: Secondary | ICD-10-CM

## 2017-02-17 DIAGNOSIS — G9341 Metabolic encephalopathy: Secondary | ICD-10-CM

## 2017-02-17 LAB — GLUCOSE, CAPILLARY
GLUCOSE-CAPILLARY: 118 mg/dL — AB (ref 65–99)
GLUCOSE-CAPILLARY: 95 mg/dL (ref 65–99)
GLUCOSE-CAPILLARY: 95 mg/dL (ref 65–99)
Glucose-Capillary: 133 mg/dL — ABNORMAL HIGH (ref 65–99)
Glucose-Capillary: 76 mg/dL (ref 65–99)
Glucose-Capillary: 77 mg/dL (ref 65–99)

## 2017-02-17 LAB — BASIC METABOLIC PANEL
Anion gap: 14 (ref 5–15)
BUN: 71 mg/dL — AB (ref 6–20)
CALCIUM: 8.3 mg/dL — AB (ref 8.9–10.3)
CO2: 27 mmol/L (ref 22–32)
Chloride: 97 mmol/L — ABNORMAL LOW (ref 101–111)
Creatinine, Ser: 8.39 mg/dL — ABNORMAL HIGH (ref 0.61–1.24)
GFR calc Af Amer: 7 mL/min — ABNORMAL LOW (ref >60)
GFR, EST NON AFRICAN AMERICAN: 6 mL/min — AB (ref >60)
Glucose, Bld: 115 mg/dL — ABNORMAL HIGH (ref 65–99)
Potassium: 3.5 mmol/L (ref 3.5–5.1)
SODIUM: 138 mmol/L (ref 135–145)

## 2017-02-17 LAB — BLOOD GAS, ARTERIAL
ALLENS TEST (PASS/FAIL): POSITIVE — AB
Acid-Base Excess: 2.5 mmol/L — ABNORMAL HIGH (ref 0.0–2.0)
Bicarbonate: 28.4 mmol/L — ABNORMAL HIGH (ref 20.0–28.0)
FIO2: 30
O2 Saturation: 95.9 %
PEEP/CPAP: 5 cmH2O
PH ART: 7.38 (ref 7.350–7.450)
Patient temperature: 37
RATE: 16 resp/min
VT: 500 mL
pCO2 arterial: 48 mmHg (ref 32.0–48.0)
pO2, Arterial: 83 mmHg (ref 83.0–108.0)

## 2017-02-17 LAB — CBC
HCT: 23.2 % — ABNORMAL LOW (ref 40.0–52.0)
Hemoglobin: 8 g/dL — ABNORMAL LOW (ref 13.0–18.0)
MCH: 29.6 pg (ref 26.0–34.0)
MCHC: 34.5 g/dL (ref 32.0–36.0)
MCV: 85.9 fL (ref 80.0–100.0)
PLATELETS: 304 10*3/uL (ref 150–440)
RBC: 2.7 MIL/uL — ABNORMAL LOW (ref 4.40–5.90)
RDW: 14.4 % (ref 11.5–14.5)
WBC: 12.2 10*3/uL — AB (ref 3.8–10.6)

## 2017-02-17 LAB — PROCALCITONIN: PROCALCITONIN: 12.39 ng/mL

## 2017-02-17 LAB — PHOSPHORUS: Phosphorus: 6.8 mg/dL — ABNORMAL HIGH (ref 2.5–4.6)

## 2017-02-17 LAB — MAGNESIUM: MAGNESIUM: 1.9 mg/dL (ref 1.7–2.4)

## 2017-02-17 MED ORDER — VITAL AF 1.2 CAL PO LIQD: 1000.0000 mL | ORAL | Status: DC

## 2017-02-17 MED ORDER — QUETIAPINE FUMARATE 200 MG PO TABS
200.0000 mg | ORAL_TABLET | Freq: Two times a day (BID) | ORAL | Status: DC
  Administered 2017-02-17: 200 mg
  Filled 2017-02-17: qty 1

## 2017-02-17 MED ORDER — IPRATROPIUM-ALBUTEROL 0.5-2.5 (3) MG/3ML IN SOLN: 3.0000 mL | Freq: Four times a day (QID) | RESPIRATORY_TRACT | Status: DC | PRN

## 2017-02-17 MED ORDER — FLUPHENAZINE HCL 5 MG PO TABS
5.0000 mg | ORAL_TABLET | Freq: Every day | ORAL | Status: DC
  Administered 2017-02-17 – 2017-02-21 (×4): 5 mg via ORAL
  Filled 2017-02-17 (×6): qty 1

## 2017-02-17 MED ORDER — CHLORHEXIDINE GLUCONATE 0.12 % MT SOLN
OROMUCOSAL | Status: AC
  Administered 2017-02-17: 15 mL via OROMUCOSAL
  Filled 2017-02-17: qty 15

## 2017-02-17 MED ORDER — VITAL AF 1.2 CAL PO LIQD
1000.0000 mL | ORAL | Status: DC
  Administered 2017-02-17: 1000 mL

## 2017-02-17 MED ORDER — SODIUM CHLORIDE 0.9 % IV SOLN
INTRAVENOUS | Status: DC
  Administered 2017-02-17 (×2): via INTRAVENOUS

## 2017-02-17 MED ORDER — FLUPHENAZINE HCL 5 MG/ML PO CONC
5.0000 mg | Freq: Every day | ORAL | Status: DC
  Filled 2017-02-17: qty 1

## 2017-02-17 MED ORDER — VALPROATE SODIUM 500 MG/5ML IV SOLN
1000.0000 mg | Freq: Once | INTRAVENOUS | Status: AC
  Administered 2017-02-17: 1000 mg via INTRAVENOUS
  Filled 2017-02-17: qty 10

## 2017-02-17 MED ORDER — SODIUM CHLORIDE 0.9% FLUSH: 10.0000 mL | INTRAVENOUS | Status: DC | PRN

## 2017-02-17 MED ORDER — SODIUM CHLORIDE 0.9 % IV SOLN
500.0000 mg | Freq: Two times a day (BID) | INTRAVENOUS | Status: DC
  Administered 2017-02-17 – 2017-02-18 (×3): 500 mg via INTRAVENOUS
  Filled 2017-02-17 (×4): qty 5

## 2017-02-17 MED ORDER — DEXTROSE 5 % IV SOLN
1.0000 g | INTRAVENOUS | Status: DC
  Administered 2017-02-17: 1 g via INTRAVENOUS
  Filled 2017-02-17 (×2): qty 10

## 2017-02-17 MED ORDER — VALPROATE SODIUM 500 MG/5ML IV SOLN
500.0000 mg | Freq: Two times a day (BID) | INTRAVENOUS | Status: DC
  Administered 2017-02-18: 500 mg via INTRAVENOUS
  Filled 2017-02-17 (×2): qty 5

## 2017-02-17 NOTE — Progress Notes (Signed)
OGT placement confirmed by Valora Piccoloana B NP. OK to use and start tube feeding.

## 2017-02-17 NOTE — Progress Notes (Signed)
[facility], [place] 02/17/17  Subjective:  Patient continues to have significant acute renal failure. Urine output remains in the oliguric range. Metabolic parameters have improved with dialysis. Remains on the ventilator at this time.   Objective:  Vital signs in last 24 hours:  Temp:  [93 F (33.9 C)-99 F (37.2 C)] 99 F (37.2 C) (07/16 0900) Pulse Rate:  [83-107] 107 (07/16 0900) Resp:  [13-25] 13 (07/16 0900) BP: (101-162)/(60-143) 110/87 (07/16 0900) SpO2:  [95 %-100 %] 95 % (07/16 0900) FiO2 (%):  [30 %-40 %] 30 % (07/16 0747) Weight:  [81.6 kg (180 lb)-84 kg (185 lb 3 oz)] 84 kg (185 lb 3 oz) (07/15 2100)  Weight change:  Filed Weights   02/16/17 1611 02/16/17 2100  Weight: 81.6 kg (180 lb) 84 kg (185 lb 3 oz)    Intake/Output:    Intake/Output Summary (Last 24 hours) at 02/17/17 1024 Last data filed at 02/17/17 0500  Gross per 24 hour  Intake          1732.92 ml  Output              325 ml  Net          1407.92 ml     Physical Exam: General: Critically ill appearing, laying in bed  HEENT ETT, OGT in place  Neck supple  Pulm/lungs Vent assisted, no crackles  CVS/Heart Regular; no rub  Abdomen:  Soft, distended  Extremities: No edema  Neurologic: Off sedation, awake  Skin: No acute rashes   Foley in place with yellow urine       Basic Metabolic Panel:   Recent Labs Lab 02/16/17 1615 02/16/17 1710 02/16/17 2140 02/16/17 2236 02/17/17 0500  NA 134* 133*  --   --  138  K UNABLE TO REPORT DUE TO HEMOLYSIS 7.5* 3.8 3.6 3.5  CL 102 102  --   --  97*  CO2 10* 11*  --   --  27  GLUCOSE 143* 137*  --   --  115*  BUN 140* 134*  --   --  71*  CREATININE 15.49* 15.14*  --   --  8.39*  CALCIUM 9.8 9.5  --   --  8.3*  MG  --   --   --   --  1.9  PHOS  --   --   --   --  6.8*     CBC:  Recent Labs Lab 02/16/17 1615 02/17/17 0500  WBC 17.6* 12.2*  NEUTROABS 16.7*  --   HGB 10.0* 8.0*  HCT 29.6* 23.2*  MCV 88.8  85.9  PLT 398 304     No results found for: HEPBSAG, HEPBSAB, HEPBIGM    Microbiology:  Recent Results (from the past 240 hour(s))  Culture, blood (Routine X 2) w Reflex to ID Panel     Status: None (Preliminary result)   Collection Time: 02/16/17  8:42 PM  Result Value Ref Range Status   Specimen Description BLOOD LEFT ARM  Final   Special Requests   Final    BOTTLES DRAWN AEROBIC AND ANAEROBIC Blood Culture adequate volume   Culture NO GROWTH < 12 HOURS  Final   Report Status PENDING  Incomplete  Culture, blood (Routine X 2) w Reflex to ID Panel     Status: None (Preliminary result)   Collection Time: 02/16/17  8:52 PM  Result Value Ref Range Status   Specimen Description BLOOD LEFT HAND  Final  Special Requests   Final    BOTTLES DRAWN AEROBIC AND ANAEROBIC Blood Culture adequate volume   Culture NO GROWTH < 12 HOURS  Final   Report Status PENDING  Incomplete  MRSA PCR Screening     Status: None   Collection Time: 02/16/17  9:00 PM  Result Value Ref Range Status   MRSA by PCR NEGATIVE NEGATIVE Final    Comment:        The GeneXpert MRSA Assay (FDA approved for NASAL specimens only), is one component of a comprehensive MRSA colonization surveillance program. It is not intended to diagnose MRSA infection nor to guide or monitor treatment for MRSA infections.     Coagulation Studies: No results for input(s): LABPROT, INR in the last 72 hours.  Urinalysis:  Recent Labs  02/16/17 1615  COLORURINE YELLOW*  LABSPEC 1.011  PHURINE 5.0  GLUCOSEU NEGATIVE  HGBUR MODERATE*  BILIRUBINUR NEGATIVE  KETONESUR NEGATIVE  PROTEINUR 100*  NITRITE NEGATIVE  LEUKOCYTESUR LARGE*      Imaging: Ct Head Wo Contrast  Result Date: 02/16/2017 CLINICAL DATA:  Fall. Hit head on night stand. Seizure. Persistent confusion. EXAM: CT HEAD WITHOUT CONTRAST CT MAXILLOFACIAL WITHOUT CONTRAST CT CERVICAL SPINE WITHOUT CONTRAST TECHNIQUE: Multidetector CT imaging of the head,  cervical spine, and maxillofacial structures were performed using the standard protocol without intravenous contrast. Multiplanar CT image reconstructions of the cervical spine and maxillofacial structures were also generated. COMPARISON:  None. FINDINGS: CT HEAD FINDINGS Brain: Mild generalized atrophy is present. No acute infarct, hemorrhage, or mass lesion is present. The ventricles are of normal size. No significant extraaxial fluid collection is present. The brainstem and cerebellum are normal. The internal auditory canals are within normal limits. Vascular: No hyperdense vessel or unexpected calcification. Skull: Left periorbital soft tissue swelling is noted. There is no underlying fracture. The calvarium is intact. No significant extracranial soft tissue injury is present otherwise. CT MAXILLOFACIAL FINDINGS Osseous: No acute fracture is present. No focal lytic or blastic lesions are present. Orbits: Left periorbital soft tissue swelling is present. The globes and orbits are within normal limits. No acute fracture is present. Sinuses: The paranasal sinuses and mastoid air cells are clear. Soft tissues: Soft tissues of the face are otherwise unremarkable. No additional soft tissue injury is evident. CT CERVICAL SPINE FINDINGS Alignment: AP alignment is anatomic. Mild rightward curvature is present. There is straightening and slight reversal of the normal cervical lordosis. Skull base and vertebrae: The skullbase is within normal limits. Vertebral body heights are maintained. No acute fracture is present. Soft tissues and spinal canal: The soft tissues of the neck demonstrate bilateral enlargement of the thyroid. No discrete mass lesion is present. There is no significant adenopathy. No other focal soft tissue abnormality is present. Disc levels:  No significant focal stenosis is evident. Upper chest: The visualized lung apices are clear. IMPRESSION: 1. Left periorbital soft tissue swelling without underlying  fracture. 2. No acute intracranial abnormality. 3. No other significant facial injury. 4. Straightening and slight reversal of the normal cervical lordosis without evidence for acute fracture or traumatic subluxation in the cervical spine. 5. Enlarged thyroid gland without discrete lesion. Electronically Signed   By: San Morelle M.D.   On: 02/16/2017 16:58   Ct Cervical Spine Wo Contrast  Result Date: 02/16/2017 CLINICAL DATA:  Fall. Hit head on night stand. Seizure. Persistent confusion. EXAM: CT HEAD WITHOUT CONTRAST CT MAXILLOFACIAL WITHOUT CONTRAST CT CERVICAL SPINE WITHOUT CONTRAST TECHNIQUE: Multidetector CT imaging of the head, cervical  spine, and maxillofacial structures were performed using the standard protocol without intravenous contrast. Multiplanar CT image reconstructions of the cervical spine and maxillofacial structures were also generated. COMPARISON:  None. FINDINGS: CT HEAD FINDINGS Brain: Mild generalized atrophy is present. No acute infarct, hemorrhage, or mass lesion is present. The ventricles are of normal size. No significant extraaxial fluid collection is present. The brainstem and cerebellum are normal. The internal auditory canals are within normal limits. Vascular: No hyperdense vessel or unexpected calcification. Skull: Left periorbital soft tissue swelling is noted. There is no underlying fracture. The calvarium is intact. No significant extracranial soft tissue injury is present otherwise. CT MAXILLOFACIAL FINDINGS Osseous: No acute fracture is present. No focal lytic or blastic lesions are present. Orbits: Left periorbital soft tissue swelling is present. The globes and orbits are within normal limits. No acute fracture is present. Sinuses: The paranasal sinuses and mastoid air cells are clear. Soft tissues: Soft tissues of the face are otherwise unremarkable. No additional soft tissue injury is evident. CT CERVICAL SPINE FINDINGS Alignment: AP alignment is anatomic.  Mild rightward curvature is present. There is straightening and slight reversal of the normal cervical lordosis. Skull base and vertebrae: The skullbase is within normal limits. Vertebral body heights are maintained. No acute fracture is present. Soft tissues and spinal canal: The soft tissues of the neck demonstrate bilateral enlargement of the thyroid. No discrete mass lesion is present. There is no significant adenopathy. No other focal soft tissue abnormality is present. Disc levels:  No significant focal stenosis is evident. Upper chest: The visualized lung apices are clear. IMPRESSION: 1. Left periorbital soft tissue swelling without underlying fracture. 2. No acute intracranial abnormality. 3. No other significant facial injury. 4. Straightening and slight reversal of the normal cervical lordosis without evidence for acute fracture or traumatic subluxation in the cervical spine. 5. Enlarged thyroid gland without discrete lesion. Electronically Signed   By: San Morelle M.D.   On: 02/16/2017 16:58   Portable Chest Xray  Result Date: 02/17/2017 CLINICAL DATA:  Respiratory failure. EXAM: PORTABLE CHEST 1 VIEW COMPARISON:  02/16/2017. FINDINGS: Endotracheal tube and NG tube in stable position. Heart size normal. Mild bibasilar subsegmental atelectasis. No pleural effusion or pneumothorax. IMPRESSION: 1. Lines and tubes in stable position. 2. Low lung volumes with mild bibasilar subsegmental atelectasis. Electronically Signed   By: Marcello Moores  Register   On: 02/17/2017 06:49   Dg Chest Portable 1 View  Result Date: 02/16/2017 CLINICAL DATA:  56 year old male status post intubation. EXAM: PORTABLE CHEST 1 VIEW COMPARISON:  Chest x-ray a 02/16/2017. FINDINGS: An endotracheal tube is in place with tip 2.8 cm above the carina. A nasogastric tube is seen extending into the stomach, however, the tip of the nasogastric tube extends below the lower margin of the image. Lung volumes are low. No consolidative  airspace disease. No pleural effusions. No pneumothorax. No suspicious appearing pulmonary nodules or masses. Heart size is normal. Upper mediastinal contours are within normal limits. IMPRESSION: 1. Support apparatus, as above. 2. Low lung volumes without radiographic evidence of acute cardiopulmonary disease. Electronically Signed   By: Vinnie Langton M.D.   On: 02/16/2017 19:29   Dg Chest Port 1 View  Result Date: 02/16/2017 CLINICAL DATA:  Recent seizure and fall, initial encounter EXAM: PORTABLE CHEST 1 VIEW COMPARISON:  None. FINDINGS: Cardiac shadow is within normal limits. The lungs are well aerated bilaterally. No acute bony abnormality is seen. IMPRESSION: No acute abnormality noted. Electronically Signed   By: Elta Guadeloupe  Lukens M.D.   On: 02/16/2017 17:11   Ct Maxillofacial Wo Contrast  Result Date: 02/16/2017 CLINICAL DATA:  Fall. Hit head on night stand. Seizure. Persistent confusion. EXAM: CT HEAD WITHOUT CONTRAST CT MAXILLOFACIAL WITHOUT CONTRAST CT CERVICAL SPINE WITHOUT CONTRAST TECHNIQUE: Multidetector CT imaging of the head, cervical spine, and maxillofacial structures were performed using the standard protocol without intravenous contrast. Multiplanar CT image reconstructions of the cervical spine and maxillofacial structures were also generated. COMPARISON:  None. FINDINGS: CT HEAD FINDINGS Brain: Mild generalized atrophy is present. No acute infarct, hemorrhage, or mass lesion is present. The ventricles are of normal size. No significant extraaxial fluid collection is present. The brainstem and cerebellum are normal. The internal auditory canals are within normal limits. Vascular: No hyperdense vessel or unexpected calcification. Skull: Left periorbital soft tissue swelling is noted. There is no underlying fracture. The calvarium is intact. No significant extracranial soft tissue injury is present otherwise. CT MAXILLOFACIAL FINDINGS Osseous: No acute fracture is present. No focal lytic or  blastic lesions are present. Orbits: Left periorbital soft tissue swelling is present. The globes and orbits are within normal limits. No acute fracture is present. Sinuses: The paranasal sinuses and mastoid air cells are clear. Soft tissues: Soft tissues of the face are otherwise unremarkable. No additional soft tissue injury is evident. CT CERVICAL SPINE FINDINGS Alignment: AP alignment is anatomic. Mild rightward curvature is present. There is straightening and slight reversal of the normal cervical lordosis. Skull base and vertebrae: The skullbase is within normal limits. Vertebral body heights are maintained. No acute fracture is present. Soft tissues and spinal canal: The soft tissues of the neck demonstrate bilateral enlargement of the thyroid. No discrete mass lesion is present. There is no significant adenopathy. No other focal soft tissue abnormality is present. Disc levels:  No significant focal stenosis is evident. Upper chest: The visualized lung apices are clear. IMPRESSION: 1. Left periorbital soft tissue swelling without underlying fracture. 2. No acute intracranial abnormality. 3. No other significant facial injury. 4. Straightening and slight reversal of the normal cervical lordosis without evidence for acute fracture or traumatic subluxation in the cervical spine. 5. Enlarged thyroid gland without discrete lesion. Electronically Signed   By: San Morelle M.D.   On: 02/16/2017 16:58     Medications:   . sodium chloride 125 mL/hr at 02/17/17 0621  . ceFEPime (MAXIPIME) IV Stopped (02/16/17 2157)  . fentaNYL infusion INTRAVENOUS Stopped (02/17/17 0800)  . levETIRAcetam Stopped (02/17/17 0713)  . propofol (DIPRIVAN) infusion Stopped (02/16/17 2015)   . bacitracin   Left Eye BID  . chlorhexidine gluconate (MEDLINE KIT)  15 mL Mouth Rinse BID  . heparin  5,000 Units Subcutaneous Q8H  . ipratropium-albuterol  3 mL Nebulization Q6H  . mouth rinse  15 mL Mouth Rinse QID  .  pantoprazole (PROTONIX) IV  40 mg Intravenous Daily     Assessment/ Plan:  56 y.o. AA male with COPD, HTN, Multinodular goiter, Schizoaffective disorder, CKD st 4, Primary hyperparathyroidism with hypercalcemia admitted for ARF  1. ARF on CKD st 4  Baseline Cr 3.3/GFR 23 from 01/27/2017 Admit Cr 15 ; likely secondary to underlying volume depletion, sepsis and severe ATN 2.  Severe hyperkalemia 3. Severe Acidosis 4. UTI with sepsis 5. Acute resp failure - requiring mechanical ventilation 6. Recent Urinary retention  Plan: Patient remains critically ill at this point in time. Urine output remains in the oliguric range. Therefore we will plan for hemodialysis today. Acidemia and hyperkalemia have both  improved. Patient will be continued on mechanical ventilation at this time. Given the fact that he had recent urinary retention we will check a renal ultrasound today. Further plan as patient progresses.     LOS: 1 Jason Diaz 7/16/201810:24 AM  [place], [place]

## 2017-02-17 NOTE — Progress Notes (Signed)
PULMONARY / CRITICAL CARE MEDICINE   Name: Jason Diaz MRN: [id number] DOB: Feb 09, 1961    ADMISSION DATE:  02/16/2017   CONSULTATION DATE:  02/16/2017  REFERRING MD:  Dr Nemiah Commander  Reason: Acute respiratory failure and new onset seizures  CHIEF COMPLAINT: Fall with head injury and seizures  HISTORY OF PRESENT ILLNESS:   This is a 56 year old African-American male with a past medical history as indicated below presented from a skilled nursing facility via EMS following a witnessed fall and seizure. History is obtained from ED, and EMS records as patient is currently intubated and sedated. EMS was called because patient slipped and fell in his urine and hit his head on the nightstand. He then had a seizure after that that lasted about 7 minutes. He was given 4 mg of Versed IM. At the ED, patient appeared to be having some intermittent twitching. He emergently intubated for worsening mental status. His CT head was negative for acute bleed but showed some soft tissue swelling and hematoma on the right orbital region. His labs showed a potassium level later than 7 and a creatinine level greater than 15. He has baseline CKD but was not on dialysis. He is being admitted to the ICU for management of acute respiratory failure and for emergent dialysis.   SUBJECTIVE: Hemodialysis completed. Patient is more alert but still not following commands. Potassium level down to 3.5, and creatinine down to 8.39. No acute issues  VITAL SIGNS: BP 101/63   Pulse 99   Temp 98.4 F (36.9 C) (Rectal)   Resp 15   Ht 5\' 10"  (1.778 m)   Wt 185 lb 3 oz (84 kg)   SpO2 100%   BMI 26.57 kg/m   HEMODYNAMICS:    VENTILATOR SETTINGS: Vent Mode: PRVC FiO2 (%):  [30 %-40 %] 30 % Set Rate:  [16 bmp] 16 bmp Vt Set:  [500 mL] 500 mL PEEP:  [5 cmH20] 5 cmH20 Plateau Pressure:  [17 cmH20] 17 cmH20  INTAKE / OUTPUT: No intake/output data recorded.  PHYSICAL EXAMINATION: General:  Chronically ill-looking, older  for age Neuro:  Opens eyes to voice and touch, withdraws to noxious stimulus, positive corneal and gag reflexes HEENT:  Exophthalmus, Pupils are equal, pinpoint,  left eye with small scleral abrasion and scleral erythema Cardiovascular:  Apical pulse palpable, regular, S1, S2 audible, no murmur, regurg or gallop, +2 pulses bilaterally, no edema Lungs:  Bilateral breath sounds, no wheezing and no rhonchi Abdomen:  Nondistended, normal bowel sounds in all 4 quadrants, palpation reveals no organomegaly Musculoskeletal:  No deformities, positive range of motion in upper and lower extremity Skin:  Skin is dry  LABS:  BMET  Recent Labs Lab 02/16/17 1615 02/16/17 1710 02/16/17 2140 02/16/17 2236 02/17/17 0500  NA 134* 133*  --   --  138  K UNABLE TO REPORT DUE TO HEMOLYSIS 7.5* 3.8 3.6 3.5  CL 102 102  --   --  97*  CO2 10* 11*  --   --  27  BUN 140* 134*  --   --  71*  CREATININE 15.49* 15.14*  --   --  8.39*  GLUCOSE 143* 137*  --   --  115*    Electrolytes  Recent Labs Lab 02/16/17 1615 02/16/17 1710 02/17/17 0500  CALCIUM 9.8 9.5 8.3*    CBC  Recent Labs Lab 02/16/17 1615 02/17/17 0500  WBC 17.6* 12.2*  HGB 10.0* 8.0*  HCT 29.6* 23.2*  PLT 398 304  Coag's No results for input(s): APTT, INR in the last 168 hours.  Sepsis Markers  Recent Labs Lab 02/16/17 1614 02/16/17 2051  LATICACIDVEN 2.3* 0.7  PROCALCITON  --  8.58    ABG  Recent Labs Lab 02/16/17 1946  PHART 7.26*  PCO2ART 28*  PO2ART 174*    Liver Enzymes  Recent Labs Lab 02/16/17 1615 02/16/17 1710  AST UNABLE TO REPORT DUE TO HEMOLYSIS 28  ALT 18 22  ALKPHOS 114 109  BILITOT 1.5* 1.0  ALBUMIN 3.1* 3.0*    Cardiac Enzymes  Recent Labs Lab 02/16/17 1615  TROPONINI 0.04*    Glucose  Recent Labs Lab 02/16/17 1624 02/16/17 2026 02/17/17 0003 02/17/17 0349  GLUCAP 127* 143* 133* 118*    Imaging Ct Head Wo Contrast  Result Date: 02/16/2017 CLINICAL DATA:  Fall.  Hit head on night stand. Seizure. Persistent confusion. EXAM: CT HEAD WITHOUT CONTRAST CT MAXILLOFACIAL WITHOUT CONTRAST CT CERVICAL SPINE WITHOUT CONTRAST TECHNIQUE: Multidetector CT imaging of the head, cervical spine, and maxillofacial structures were performed using the standard protocol without intravenous contrast. Multiplanar CT image reconstructions of the cervical spine and maxillofacial structures were also generated. COMPARISON:  None. FINDINGS: CT HEAD FINDINGS Brain: Mild generalized atrophy is present. No acute infarct, hemorrhage, or mass lesion is present. The ventricles are of normal size. No significant extraaxial fluid collection is present. The brainstem and cerebellum are normal. The internal auditory canals are within normal limits. Vascular: No hyperdense vessel or unexpected calcification. Skull: Left periorbital soft tissue swelling is noted. There is no underlying fracture. The calvarium is intact. No significant extracranial soft tissue injury is present otherwise. CT MAXILLOFACIAL FINDINGS Osseous: No acute fracture is present. No focal lytic or blastic lesions are present. Orbits: Left periorbital soft tissue swelling is present. The globes and orbits are within normal limits. No acute fracture is present. Sinuses: The paranasal sinuses and mastoid air cells are clear. Soft tissues: Soft tissues of the face are otherwise unremarkable. No additional soft tissue injury is evident. CT CERVICAL SPINE FINDINGS Alignment: AP alignment is anatomic. Mild rightward curvature is present. There is straightening and slight reversal of the normal cervical lordosis. Skull base and vertebrae: The skullbase is within normal limits. Vertebral body heights are maintained. No acute fracture is present. Soft tissues and spinal canal: The soft tissues of the neck demonstrate bilateral enlargement of the thyroid. No discrete mass lesion is present. There is no significant adenopathy. No other focal soft  tissue abnormality is present. Disc levels:  No significant focal stenosis is evident. Upper chest: The visualized lung apices are clear. IMPRESSION: 1. Left periorbital soft tissue swelling without underlying fracture. 2. No acute intracranial abnormality. 3. No other significant facial injury. 4. Straightening and slight reversal of the normal cervical lordosis without evidence for acute fracture or traumatic subluxation in the cervical spine. 5. Enlarged thyroid gland without discrete lesion. Electronically Signed   By: Marin Robertshristopher  Mattern M.D.   On: 02/16/2017 16:58   Ct Cervical Spine Wo Contrast  Result Date: 02/16/2017 CLINICAL DATA:  Fall. Hit head on night stand. Seizure. Persistent confusion. EXAM: CT HEAD WITHOUT CONTRAST CT MAXILLOFACIAL WITHOUT CONTRAST CT CERVICAL SPINE WITHOUT CONTRAST TECHNIQUE: Multidetector CT imaging of the head, cervical spine, and maxillofacial structures were performed using the standard protocol without intravenous contrast. Multiplanar CT image reconstructions of the cervical spine and maxillofacial structures were also generated. COMPARISON:  None. FINDINGS: CT HEAD FINDINGS Brain: Mild generalized atrophy is present. No acute infarct, hemorrhage, or  mass lesion is present. The ventricles are of normal size. No significant extraaxial fluid collection is present. The brainstem and cerebellum are normal. The internal auditory canals are within normal limits. Vascular: No hyperdense vessel or unexpected calcification. Skull: Left periorbital soft tissue swelling is noted. There is no underlying fracture. The calvarium is intact. No significant extracranial soft tissue injury is present otherwise. CT MAXILLOFACIAL FINDINGS Osseous: No acute fracture is present. No focal lytic or blastic lesions are present. Orbits: Left periorbital soft tissue swelling is present. The globes and orbits are within normal limits. No acute fracture is present. Sinuses: The paranasal sinuses  and mastoid air cells are clear. Soft tissues: Soft tissues of the face are otherwise unremarkable. No additional soft tissue injury is evident. CT CERVICAL SPINE FINDINGS Alignment: AP alignment is anatomic. Mild rightward curvature is present. There is straightening and slight reversal of the normal cervical lordosis. Skull base and vertebrae: The skullbase is within normal limits. Vertebral body heights are maintained. No acute fracture is present. Soft tissues and spinal canal: The soft tissues of the neck demonstrate bilateral enlargement of the thyroid. No discrete mass lesion is present. There is no significant adenopathy. No other focal soft tissue abnormality is present. Disc levels:  No significant focal stenosis is evident. Upper chest: The visualized lung apices are clear. IMPRESSION: 1. Left periorbital soft tissue swelling without underlying fracture. 2. No acute intracranial abnormality. 3. No other significant facial injury. 4. Straightening and slight reversal of the normal cervical lordosis without evidence for acute fracture or traumatic subluxation in the cervical spine. 5. Enlarged thyroid gland without discrete lesion. Electronically Signed   By: Marin Roberts M.D.   On: 02/16/2017 16:58   Dg Chest Portable 1 View  Result Date: 02/16/2017 CLINICAL DATA:  56 year old male status post intubation. EXAM: PORTABLE CHEST 1 VIEW COMPARISON:  Chest x-ray a 02/16/2017. FINDINGS: An endotracheal tube is in place with tip 2.8 cm above the carina. A nasogastric tube is seen extending into the stomach, however, the tip of the nasogastric tube extends below the lower margin of the image. Lung volumes are low. No consolidative airspace disease. No pleural effusions. No pneumothorax. No suspicious appearing pulmonary nodules or masses. Heart size is normal. Upper mediastinal contours are within normal limits. IMPRESSION: 1. Support apparatus, as above. 2. Low lung volumes without radiographic  evidence of acute cardiopulmonary disease. Electronically Signed   By: Trudie Reed M.D.   On: 02/16/2017 19:29   Dg Chest Port 1 View  Result Date: 02/16/2017 CLINICAL DATA:  Recent seizure and fall, initial encounter EXAM: PORTABLE CHEST 1 VIEW COMPARISON:  None. FINDINGS: Cardiac shadow is within normal limits. The lungs are well aerated bilaterally. No acute bony abnormality is seen. IMPRESSION: No acute abnormality noted. Electronically Signed   By: Alcide Clever M.D.   On: 02/16/2017 17:11   Ct Maxillofacial Wo Contrast  Result Date: 02/16/2017 CLINICAL DATA:  Fall. Hit head on night stand. Seizure. Persistent confusion. EXAM: CT HEAD WITHOUT CONTRAST CT MAXILLOFACIAL WITHOUT CONTRAST CT CERVICAL SPINE WITHOUT CONTRAST TECHNIQUE: Multidetector CT imaging of the head, cervical spine, and maxillofacial structures were performed using the standard protocol without intravenous contrast. Multiplanar CT image reconstructions of the cervical spine and maxillofacial structures were also generated. COMPARISON:  None. FINDINGS: CT HEAD FINDINGS Brain: Mild generalized atrophy is present. No acute infarct, hemorrhage, or mass lesion is present. The ventricles are of normal size. No significant extraaxial fluid collection is present. The brainstem and cerebellum are  normal. The internal auditory canals are within normal limits. Vascular: No hyperdense vessel or unexpected calcification. Skull: Left periorbital soft tissue swelling is noted. There is no underlying fracture. The calvarium is intact. No significant extracranial soft tissue injury is present otherwise. CT MAXILLOFACIAL FINDINGS Osseous: No acute fracture is present. No focal lytic or blastic lesions are present. Orbits: Left periorbital soft tissue swelling is present. The globes and orbits are within normal limits. No acute fracture is present. Sinuses: The paranasal sinuses and mastoid air cells are clear. Soft tissues: Soft tissues of the face  are otherwise unremarkable. No additional soft tissue injury is evident. CT CERVICAL SPINE FINDINGS Alignment: AP alignment is anatomic. Mild rightward curvature is present. There is straightening and slight reversal of the normal cervical lordosis. Skull base and vertebrae: The skullbase is within normal limits. Vertebral body heights are maintained. No acute fracture is present. Soft tissues and spinal canal: The soft tissues of the neck demonstrate bilateral enlargement of the thyroid. No discrete mass lesion is present. There is no significant adenopathy. No other focal soft tissue abnormality is present. Disc levels:  No significant focal stenosis is evident. Upper chest: The visualized lung apices are clear. IMPRESSION: 1. Left periorbital soft tissue swelling without underlying fracture. 2. No acute intracranial abnormality. 3. No other significant facial injury. 4. Straightening and slight reversal of the normal cervical lordosis without evidence for acute fracture or traumatic subluxation in the cervical spine. 5. Enlarged thyroid gland without discrete lesion. Electronically Signed   By: Marin Roberts M.D.   On: 02/16/2017 16:58     STUDIES:  EEG other  CULTURES:  Blood cultures 2. Urine cultures  ANTIBIOTICS:  Vancomycin 02/16/2017- Zosyn 02/16/2017 to 02/16/2017 Cefepime 02/16/2017-  SIGNIFICANT EVENTS: 02/16/2017: ED with seizure, failed head injury from fall, and acute respiratory failure  LINES/TUBES:  Peripheral IVs Right femoral HD catheter  DISCUSSION:  56 year old male with an extensive psychiatric history, presenting with acute hypoxemic respiratory failure, end-stage renal disease necessitating hemodialysis, severe hyperkalemia, metabolic acidosis, sepsis and new onset seizures  ASSESSMENT Acute hypoxemic respiratory failure Severe hyperkalemia  Acute on chronic renal failure-Baseline creatinine 3.35; now 15.14 Metabolic acidosis with anion gap of 20 New  onset seizures-trauma versus severe uremia Sepsis of unknown origin  Plan Vent settings modified based on ABG results for, continue full vent support with current settings Follow-up ABG in a.m. Chest x-ray when necessary Continue Propofol and prn versed for vent sedation Continue VAP protocol Weaning trials as tolerated. Nephrology consulted for emergent dialysis; awaiting nephrology input HD completed; 0 fluids out Discontinue Bicarb infusion Monitor and correct electrolyte imbalances. Trend BMP Pro-calcitonin level Continue her prior  Antibiotics as above. Follow-up cultures Normal saline at 119ml/hr Strict I and O monitoring GI and DVT prophylaxis Further changes in treatment plan pending clinical course and diagnostics   FAMILY  - Updates:  No family at bedside. Mother updated by nursing  - Inter-disciplinary family meet or Palliative Care meeting due by:  day 7   Magdalene S. [facility] ANP-BC Pulmonary and Critical Care Medicine [facility] [place] Pager [phone] or [phone]  02/17/2017, 6:01 AM

## 2017-02-17 NOTE — Progress Notes (Signed)
HD STARTED  

## 2017-02-17 NOTE — Consult Note (Addendum)
Reason for Consult:Seizure like activity Referring Physician: Posey Pronto  CC: Seizure like activity  HPI: Jason Diaz is an 56 y.o. male who is unable to provide any history due to mental status.  Family not available therefore all history obtained from the chart.  Has a known history of chronic stage IV kidney disease (baseline creatinine around 3.4), GERD, schizoaffective disorder and hypertension came to the emergency room from [place] after patient slipping in his urine causing him to fall and hitting his head on the nightstand. Staff noted shaking that was possibly seizure and patient received 4 mg of Versed. Patient was quite confused, came to the emergency room and was found to be in septic shock.  His white count was elevated to 17,000 was tachycardic and creatinine was 15.4 with potassium of 7.5.   Past Medical History:  Diagnosis Date  . CKD (chronic kidney disease), stage IV ([place])   . COPD (chronic obstructive pulmonary disease) ([place])   . GERD (gastroesophageal reflux disease)   . Hypertension   . Multinodular goiter   . Schizoaffective disorder [facility])     Past Surgical History:  Procedure Laterality Date  . TONSILLECTOMY    . TOOTH EXTRACTION      Family History  Problem Relation Age of Onset  . Kidney disease Father   . Diabetes Father     Social History:  reports that he has been smoking Cigarettes.  He has been smoking about 0.25 packs per day. He has never used smokeless tobacco. He reports that he does not drink alcohol or use drugs.  No Known Allergies  Medications:  I have reviewed the patient's current medications. Prior to Admission:  Prescriptions Prior to Admission  Medication Sig Dispense Refill Last Dose  . amLODipine (NORVASC) 5 MG tablet Take 5 mg by mouth daily.   02/16/2017 at am  . Cholecalciferol (VITAMIN D3) 5000 units TABS Take 5,000 Units by mouth daily.   02/16/2017 at am  . ezetimibe (ZETIA) 10 MG tablet Take 10 mg by mouth daily.    02/16/2017 at am  . fluPHENAZine (PROLIXIN) 5 MG tablet Take 5 mg by mouth at bedtime as needed.    prn at prn  . furosemide (LASIX) 40 MG tablet Take 40 mg by mouth daily.   02/16/2017 at am  . gemfibrozil (LOPID) 600 MG tablet Take 600 mg by mouth 2 (two) times daily.   02/16/2017 at am  . metoprolol succinate (TOPROL-XL) 100 MG 24 hr tablet Take 100 mg by mouth daily.   02/16/2017 at am  . pantoprazole (PROTONIX) 40 MG tablet Take 40 mg by mouth daily.   02/16/2017 at am  . polyethylene glycol (MIRALAX / GLYCOLAX) packet Take 17 g by mouth daily. 30 each 3 02/16/2017 at am  . potassium chloride (K-DUR) 10 MEQ tablet Take 10 mEq by mouth daily.   02/16/2017 at am  . pravastatin (PRAVACHOL) 40 MG tablet Take 40 mg by mouth daily.   02/16/2017 at am  . QUEtiapine (SEROQUEL) 400 MG tablet Take 200-400 mg by mouth See admin instructions. tk 0.5 tab qam and 0.5 tab at noon, then take one tab qhs   02/16/2017 at am  . risperidone (RISPERDAL) 4 MG tablet Take 4 mg by mouth 2 (two) times daily.   02/16/2017 at am  . tamsulosin (FLOMAX) 0.4 MG CAPS capsule Take 1 capsule (0.4 mg total) by mouth daily. 30 capsule 3 02/16/2017 at am  . Tiotropium Bromide Monohydrate (SPIRIVA RESPIMAT) 2.5 MCG/ACT  AERS Inhale 2 puffs into the lungs daily.   02/16/2017 at am  . traZODone (DESYREL) 150 MG tablet Take 300 mg by mouth at bedtime as needed.    prn at prn  . amLODipine (NORVASC) 5 MG tablet Take by mouth.   Unknown at Unknown time  . fluPHENAZine (PROLIXIN) 5 MG tablet Take by mouth.   Unknown at Unknown time  . pantoprazole (PROTONIX) 40 MG tablet Take by mouth.   Unknown at Unknown time  . QUEtiapine (SEROQUEL XR) 400 MG 24 hr tablet Take by mouth.   Unknown at Unknown time  . tiotropium (SPIRIVA) 18 MCG inhalation capsule Place into inhaler and inhale.   Unknown at Unknown time   Scheduled: . bacitracin   Left Eye BID  . chlorhexidine gluconate (MEDLINE KIT)  15 mL Mouth Rinse BID  . heparin  5,000 Units Subcutaneous  Q8H  . ipratropium-albuterol  3 mL Nebulization Q6H  . mouth rinse  15 mL Mouth Rinse QID  . pantoprazole (PROTONIX) IV  40 mg Intravenous Daily    ROS: Patient unable to provide secondary to mental status  Physical Examination: Blood pressure 126/80, pulse (!) 112, temperature 99.1 F (37.3 C), resp. rate 16, height _0  (1.778 m), weight 84 kg (185 lb 3 oz), SpO2 91 %.  HEENT-  Normocephalic, no lesions, without obvious abnormality.  Normal external eye and conjunctiva.  Normal TM's bilaterally.  Normal auditory canals and external ears. Normal external nose, mucus membranes and septum.  Normal pharynx. Cardiovascular- S1, S2 normal, pulses palpable throughout   Lungs- chest clear, no wheezing, rales, normal symmetric air entry Abdomen- soft, non-tender; bowel sounds normal; no masses,  no organomegaly Extremities- no edema Lymph-no adenopathy palpable Musculoskeletal-no joint tenderness, deformity or swelling Skin-warm and dry, no hyperpigmentation, vitiligo, or suspicious lesions  Neurological Examination   Mental Status: Patient does not respond to verbal stimuli.  Opens eyes to light sternal rub.  Does not follow commands.  No verbalizations noted.  Cranial Nerves: II: Pupils right 3 mm, left 3 mm,and reactive bilaterally III,IV,VI: Able to visually track examiner around the room.   V,VII: corneal reflex reduced bilaterally  VIII: Does not follow commands IX,X: gag reflex reduced, XI: trapezius strength unable to test bilaterally XII: tongue strength unable to test Motor: Able to move all extremities.  Maintains arms a few seconds against gravity.  Weak hand grip bilaterally Sensory: Respond to noxious stimuli in all extremities. Deep Tendon Reflexes:  1+ in the upper extremities and absent in the lower extremities. Plantars: Mute bilaterally Cerebellar: Unable to perform        Laboratory Studies:   Basic Metabolic Panel:  Recent Labs Lab 02/16/17 1615  02/16/17 1710 02/16/17 2140 02/16/17 2236 02/17/17 0500  NA 134* 133*  --   --  138  K UNABLE TO REPORT DUE TO HEMOLYSIS 7.5* 3.8 3.6 3.5  CL 102 102  --   --  97*  CO2 10* 11*  --   --  27  GLUCOSE 143* 137*  --   --  115*  BUN 140* 134*  --   --  71*  CREATININE 15.49* 15.14*  --   --  8.39*  CALCIUM 9.8 9.5  --   --  8.3*  MG  --   --   --   --  1.9  PHOS  --   --   --   --  6.8*    Liver Function Tests:  Recent Labs Lab  02/16/17 1615 02/16/17 1710  AST UNABLE TO REPORT DUE TO HEMOLYSIS 28  ALT 18 22  ALKPHOS 114 109  BILITOT 1.5* 1.0  PROT 8.2* 7.9  ALBUMIN 3.1* 3.0*   No results for input(s): LIPASE, AMYLASE in the last 168 hours. No results for input(s): AMMONIA in the last 168 hours.  CBC:  Recent Labs Lab 02/16/17 1615 02/17/17 0500  WBC 17.6* 12.2*  NEUTROABS 16.7*  --   HGB 10.0* 8.0*  HCT 29.6* 23.2*  MCV 88.8 85.9  PLT 398 304    Cardiac Enzymes:  Recent Labs Lab 02/16/17 1615  TROPONINI 0.04*    BNP: Invalid input(s): POCBNP  CBG:  Recent Labs Lab 02/16/17 2026 02/17/17 0003 02/17/17 0349 02/17/17 0716 02/17/17 1104  GLUCAP 143* 133* 118* 95 95    Microbiology: Results for orders placed or performed during the hospital encounter of 02/16/17  Culture, blood (Routine X 2) w Reflex to ID Panel     Status: None (Preliminary result)   Collection Time: 02/16/17  8:42 PM  Result Value Ref Range Status   Specimen Description BLOOD LEFT ARM  Final   Special Requests   Final    BOTTLES DRAWN AEROBIC AND ANAEROBIC Blood Culture adequate volume   Culture NO GROWTH < 12 HOURS  Final   Report Status PENDING  Incomplete  Culture, blood (Routine X 2) w Reflex to ID Panel     Status: None (Preliminary result)   Collection Time: 02/16/17  8:52 PM  Result Value Ref Range Status   Specimen Description BLOOD LEFT HAND  Final   Special Requests   Final    BOTTLES DRAWN AEROBIC AND ANAEROBIC Blood Culture adequate volume   Culture NO GROWTH  < 12 HOURS  Final   Report Status PENDING  Incomplete  MRSA PCR Screening     Status: None   Collection Time: 02/16/17  9:00 PM  Result Value Ref Range Status   MRSA by PCR NEGATIVE NEGATIVE Final    Comment:        The GeneXpert MRSA Assay (FDA approved for NASAL specimens only), is one component of a comprehensive MRSA colonization surveillance program. It is not intended to diagnose MRSA infection nor to guide or monitor treatment for MRSA infections.     Coagulation Studies: No results for input(s): LABPROT, INR in the last 72 hours.  Urinalysis:  Recent Labs Lab 02/16/17 1615  COLORURINE YELLOW*  LABSPEC 1.011  PHURINE 5.0  GLUCOSEU NEGATIVE  HGBUR MODERATE*  BILIRUBINUR NEGATIVE  KETONESUR NEGATIVE  PROTEINUR 100*  NITRITE NEGATIVE  LEUKOCYTESUR LARGE*    Lipid Panel:     Component Value Date/Time   TRIG 265 (H) 02/16/2017 2051    HgbA1C: No results found for: HGBA1C  Urine Drug Screen:  No results found for: LABOPIA, COCAINSCRNUR, LABBENZ, AMPHETMU, THCU, LABBARB  Alcohol Level: No results for input(s): ETH in the last 168 hours.   Imaging: Ct Head Wo Contrast  Result Date: 02/16/2017 CLINICAL DATA:  Fall. Hit head on night stand. Seizure. Persistent confusion. EXAM: CT HEAD WITHOUT CONTRAST CT MAXILLOFACIAL WITHOUT CONTRAST CT CERVICAL SPINE WITHOUT CONTRAST TECHNIQUE: Multidetector CT imaging of the head, cervical spine, and maxillofacial structures were performed using the standard protocol without intravenous contrast. Multiplanar CT image reconstructions of the cervical spine and maxillofacial structures were also generated. COMPARISON:  None. FINDINGS: CT HEAD FINDINGS Brain: Mild generalized atrophy is present. No acute infarct, hemorrhage, or mass lesion is present. The ventricles are of normal size.  No significant extraaxial fluid collection is present. The brainstem and cerebellum are normal. The internal auditory canals are within normal limits.  Vascular: No hyperdense vessel or unexpected calcification. Skull: Left periorbital soft tissue swelling is noted. There is no underlying fracture. The calvarium is intact. No significant extracranial soft tissue injury is present otherwise. CT MAXILLOFACIAL FINDINGS Osseous: No acute fracture is present. No focal lytic or blastic lesions are present. Orbits: Left periorbital soft tissue swelling is present. The globes and orbits are within normal limits. No acute fracture is present. Sinuses: The paranasal sinuses and mastoid air cells are clear. Soft tissues: Soft tissues of the face are otherwise unremarkable. No additional soft tissue injury is evident. CT CERVICAL SPINE FINDINGS Alignment: AP alignment is anatomic. Mild rightward curvature is present. There is straightening and slight reversal of the normal cervical lordosis. Skull base and vertebrae: The skullbase is within normal limits. Vertebral body heights are maintained. No acute fracture is present. Soft tissues and spinal canal: The soft tissues of the neck demonstrate bilateral enlargement of the thyroid. No discrete mass lesion is present. There is no significant adenopathy. No other focal soft tissue abnormality is present. Disc levels:  No significant focal stenosis is evident. Upper chest: The visualized lung apices are clear. IMPRESSION: 1. Left periorbital soft tissue swelling without underlying fracture. 2. No acute intracranial abnormality. 3. No other significant facial injury. 4. Straightening and slight reversal of the normal cervical lordosis without evidence for acute fracture or traumatic subluxation in the cervical spine. 5. Enlarged thyroid gland without discrete lesion. Electronically Signed   By: San Morelle M.D.   On: 02/16/2017 16:58   Ct Cervical Spine Wo Contrast  Result Date: 02/16/2017 CLINICAL DATA:  Fall. Hit head on night stand. Seizure. Persistent confusion. EXAM: CT HEAD WITHOUT CONTRAST CT MAXILLOFACIAL  WITHOUT CONTRAST CT CERVICAL SPINE WITHOUT CONTRAST TECHNIQUE: Multidetector CT imaging of the head, cervical spine, and maxillofacial structures were performed using the standard protocol without intravenous contrast. Multiplanar CT image reconstructions of the cervical spine and maxillofacial structures were also generated. COMPARISON:  None. FINDINGS: CT HEAD FINDINGS Brain: Mild generalized atrophy is present. No acute infarct, hemorrhage, or mass lesion is present. The ventricles are of normal size. No significant extraaxial fluid collection is present. The brainstem and cerebellum are normal. The internal auditory canals are within normal limits. Vascular: No hyperdense vessel or unexpected calcification. Skull: Left periorbital soft tissue swelling is noted. There is no underlying fracture. The calvarium is intact. No significant extracranial soft tissue injury is present otherwise. CT MAXILLOFACIAL FINDINGS Osseous: No acute fracture is present. No focal lytic or blastic lesions are present. Orbits: Left periorbital soft tissue swelling is present. The globes and orbits are within normal limits. No acute fracture is present. Sinuses: The paranasal sinuses and mastoid air cells are clear. Soft tissues: Soft tissues of the face are otherwise unremarkable. No additional soft tissue injury is evident. CT CERVICAL SPINE FINDINGS Alignment: AP alignment is anatomic. Mild rightward curvature is present. There is straightening and slight reversal of the normal cervical lordosis. Skull base and vertebrae: The skullbase is within normal limits. Vertebral body heights are maintained. No acute fracture is present. Soft tissues and spinal canal: The soft tissues of the neck demonstrate bilateral enlargement of the thyroid. No discrete mass lesion is present. There is no significant adenopathy. No other focal soft tissue abnormality is present. Disc levels:  No significant focal stenosis is evident. Upper chest: The  visualized lung apices are  clear. IMPRESSION: 1. Left periorbital soft tissue swelling without underlying fracture. 2. No acute intracranial abnormality. 3. No other significant facial injury. 4. Straightening and slight reversal of the normal cervical lordosis without evidence for acute fracture or traumatic subluxation in the cervical spine. 5. Enlarged thyroid gland without discrete lesion. Electronically Signed   By: San Morelle M.D.   On: 02/16/2017 16:58   Portable Chest Xray  Result Date: 02/17/2017 CLINICAL DATA:  Respiratory failure. EXAM: PORTABLE CHEST 1 VIEW COMPARISON:  02/16/2017. FINDINGS: Endotracheal tube and NG tube in stable position. Heart size normal. Mild bibasilar subsegmental atelectasis. No pleural effusion or pneumothorax. IMPRESSION: 1. Lines and tubes in stable position. 2. Low lung volumes with mild bibasilar subsegmental atelectasis. Electronically Signed   By: Marcello Moores  Register   On: 02/17/2017 06:49   Dg Chest Portable 1 View  Result Date: 02/16/2017 CLINICAL DATA:  56 year old male status post intubation. EXAM: PORTABLE CHEST 1 VIEW COMPARISON:  Chest x-ray a 02/16/2017. FINDINGS: An endotracheal tube is in place with tip 2.8 cm above the carina. A nasogastric tube is seen extending into the stomach, however, the tip of the nasogastric tube extends below the lower margin of the image. Lung volumes are low. No consolidative airspace disease. No pleural effusions. No pneumothorax. No suspicious appearing pulmonary nodules or masses. Heart size is normal. Upper mediastinal contours are within normal limits. IMPRESSION: 1. Support apparatus, as above. 2. Low lung volumes without radiographic evidence of acute cardiopulmonary disease. Electronically Signed   By: Vinnie Langton M.D.   On: 02/16/2017 19:29   Dg Chest Port 1 View  Result Date: 02/16/2017 CLINICAL DATA:  Recent seizure and fall, initial encounter EXAM: PORTABLE CHEST 1 VIEW COMPARISON:  None. FINDINGS:  Cardiac shadow is within normal limits. The lungs are well aerated bilaterally. No acute bony abnormality is seen. IMPRESSION: No acute abnormality noted. Electronically Signed   By: Inez Catalina M.D.   On: 02/16/2017 17:11   Ct Maxillofacial Wo Contrast  Result Date: 02/16/2017 CLINICAL DATA:  Fall. Hit head on night stand. Seizure. Persistent confusion. EXAM: CT HEAD WITHOUT CONTRAST CT MAXILLOFACIAL WITHOUT CONTRAST CT CERVICAL SPINE WITHOUT CONTRAST TECHNIQUE: Multidetector CT imaging of the head, cervical spine, and maxillofacial structures were performed using the standard protocol without intravenous contrast. Multiplanar CT image reconstructions of the cervical spine and maxillofacial structures were also generated. COMPARISON:  None. FINDINGS: CT HEAD FINDINGS Brain: Mild generalized atrophy is present. No acute infarct, hemorrhage, or mass lesion is present. The ventricles are of normal size. No significant extraaxial fluid collection is present. The brainstem and cerebellum are normal. The internal auditory canals are within normal limits. Vascular: No hyperdense vessel or unexpected calcification. Skull: Left periorbital soft tissue swelling is noted. There is no underlying fracture. The calvarium is intact. No significant extracranial soft tissue injury is present otherwise. CT MAXILLOFACIAL FINDINGS Osseous: No acute fracture is present. No focal lytic or blastic lesions are present. Orbits: Left periorbital soft tissue swelling is present. The globes and orbits are within normal limits. No acute fracture is present. Sinuses: The paranasal sinuses and mastoid air cells are clear. Soft tissues: Soft tissues of the face are otherwise unremarkable. No additional soft tissue injury is evident. CT CERVICAL SPINE FINDINGS Alignment: AP alignment is anatomic. Mild rightward curvature is present. There is straightening and slight reversal of the normal cervical lordosis. Skull base and vertebrae: The  skullbase is within normal limits. Vertebral body heights are maintained. No acute fracture is present. Soft  tissues and spinal canal: The soft tissues of the neck demonstrate bilateral enlargement of the thyroid. No discrete mass lesion is present. There is no significant adenopathy. No other focal soft tissue abnormality is present. Disc levels:  No significant focal stenosis is evident. Upper chest: The visualized lung apices are clear. IMPRESSION: 1. Left periorbital soft tissue swelling without underlying fracture. 2. No acute intracranial abnormality. 3. No other significant facial injury. 4. Straightening and slight reversal of the normal cervical lordosis without evidence for acute fracture or traumatic subluxation in the cervical spine. 5. Enlarged thyroid gland without discrete lesion. Electronically Signed   By: San Morelle M.D.   On: 02/16/2017 16:58     Assessment/Plan: 56 year old male presenting with fall and altered mental status.  Also noted to have some jerking.  Question if any of this may be seizure activity.  Patient with multiple metabolic abnormalities and suspect abnormal involuntary movements as well as mental status are related to same.  No abnormal involuntary movements noted on current examination.  EEG pending.  Recommendations: 1.  Will follow up results of EEG.  No indication for anticonvulsant therapy at this time.   2.  Agree with continued addressing of metabolic/infectious issues.     Alexis Goodell, MD Neurology [phone] 02/17/2017, 11:29 AM    Addendum: EEG shows triphasic activity versus epileptiform discharges.  Will start Depakote with 1025m load, 5092mIV q12 hour maintenance.  LeAlexis GoodellMD Neurology [phone]

## 2017-02-17 NOTE — Progress Notes (Signed)
HD COMPLETED  

## 2017-02-17 NOTE — Plan of Care (Signed)
Problem: Education: Goal: Knowledge of [facility] Education information/materials will improve Outcome: Not Progressing Sedated and intubated

## 2017-02-17 NOTE — Progress Notes (Signed)
Initial Nutrition Assessment  DOCUMENTATION CODES:   Not applicable  INTERVENTION:  1. If unable to extubate, recommend initiate nepro via OGT at 110mL/hr increase by 10 every 8 hours to goal rate of 7830mL/hr Pro-stat 60mL TID Regimen provides 1896 calories, 148gm protein, 526mL free water  NUTRITION DIAGNOSIS:   Inadequate oral intake related to inability to eat (Intubated, Sedated on vent) as evidenced by NPO status.  GOAL:   Patient will meet greater than or equal to 90% of their needs  MONITOR:   Labs, Weight trends, Vent status, I & O's, TF tolerance  REASON FOR ASSESSMENT:   Ventilator    ASSESSMENT:   Patient with PMH of CKD stg IV, baseline creatinine 3.4, GERD, Hypertension, slipped and fell on his urine, possible seizure, presented with acute metabolic encephelopathy secondary to UTI and septic shock, acute on chronic renal failure, metabolic acidosis requiring emergent HD Patient is currently intubated on ventilator support MV: 8 L/min Temp (24hrs), Avg:95.9 F (35.5 C), Min:93 F (33.9 C), Max:99 F (37.2 C) Propofol: None UOP 325 over 12 hrs yesterday, Fluid Positive 1.4L Bear hugger at bedside, but currently on standby  No family available to provide diet hx, patient opens eyes, but does not appear to be meaningful. Nutrition-Focused physical exam completed. Findings are no fat depletion, severe muscle depletion (at temples), and mild edema.  OGT tip to stomach Access: R femoral HD cath, PIV  Labs reviewed:  BUN/Creatinine 71/8.39, Triglycerides 265 Medications reviewed and include:  Medline mouth rinse NS @ 12625mL/hr, fentanyl gtt, Senokot PRN, Dulcolax PRN  Diet Order:  Diet NPO time specified  Skin:  Reviewed, no issues  Last BM:  PTA  Height:   Ht Readings from Last 1 Encounters:  02/16/17 5\' 10"  (1.778 m)    Weight:   Wt Readings from Last 1 Encounters:  02/16/17 185 lb 3 oz (84 kg)    Ideal Body Weight:  75.45 kg  BMI:  Body  mass index is 26.57 kg/m.  Estimated Nutritional Needs:   Kcal:  1859 (MSJ 1694 VE 8 TMax 37.1)  Protein:  126-143 grams (1.5-1.7g/kg)  Fluid:  UOP +1L  EDUCATION NEEDS:   Education needs no appropriate at this time  Dionne AnoWilliam M. Rishi Vicario, MS, RD LDN Inpatient Clinical Dietitian Pager [phone]

## 2017-02-17 NOTE — Progress Notes (Signed)
POST DIALYSIS ASSESSMENT 

## 2017-02-17 NOTE — Progress Notes (Signed)
[facility] [place] [facility]   PATIENT NAME: Jason Diaz    MR#:  [id number]  DATE OF BIRTH:  November 07, 1960  SUBJECTIVE:  Remains critically ill and intubated on the vent  Potassium normalizes after urgent hemodialysis  REVIEW OF SYSTEMS:   Review of Systems  Constitutional: Negative for chills, fever and weight loss.  HENT: Negative for ear discharge, ear pain and nosebleeds.   Eyes: Negative for blurred vision, pain and discharge.  Respiratory: Negative for sputum production, shortness of breath, wheezing and stridor.   Cardiovascular: Negative for chest pain, palpitations, orthopnea and PND.  Gastrointestinal: Negative for abdominal pain, diarrhea, nausea and vomiting.  Genitourinary: Negative for frequency and urgency.  Musculoskeletal: Negative for back pain and joint pain.  Neurological: Positive for weakness. Negative for sensory change, speech change and focal weakness.  Psychiatric/Behavioral: Negative for depression and hallucinations. The patient is not nervous/anxious.    Tolerating Diet: Nothing by mouth Tolerating PT: pending  DRUG ALLERGIES:  No Known Allergies  VITALS:  Blood pressure 108/65, pulse 92, temperature 99 F (37.2 C), resp. rate 16, height 5\' 10"  (1.778 m), weight 84 kg (185 lb 3 oz), SpO2 100 %.  PHYSICAL EXAMINATION:   Physical Exam  GENERAL:  56 y.o.-year-old patient lying in the bed with no acute distress. Critically ill EYES: Pupils equal, round, reactive to light and accommodation. No scleral icterus. Extraocular muscles intact.  HEENT: Head atraumatic, normocephalic. Oropharynx and nasopharynx clear. Intubated and on the vent NECK:  Supple, no jugular venous distention. No thyroid enlargement, no tenderness.  LUNGS: Normal breath sounds bilaterally, no wheezing, rales, rhonchi. No use of accessory muscles of respiration.  CARDIOVASCULAR: S1, S2 normal. No murmurs, rubs, or gallops.  ABDOMEN: Soft, nontender,  nondistended. Bowel sounds present. No organomegaly or mass.  EXTREMITIES: No cyanosis, clubbing or edema b/l.    NEUROLOGIC:Intubated PSYCHIATRIC:  sedated  SKIN: No obvious rash, lesion, or ulcer.   LABORATORY PANEL:  CBC  Recent Labs Lab 02/17/17 0500  WBC 12.2*  HGB 8.0*  HCT 23.2*  PLT 304    Chemistries   Recent Labs Lab 02/16/17 1710  02/17/17 0500  NA 133*  --  138  K 7.5*  < > 3.5  CL 102  --  97*  CO2 11*  --  27  GLUCOSE 137*  --  115*  BUN 134*  --  71*  CREATININE 15.14*  --  8.39*  CALCIUM 9.5  --  8.3*  MG  --   --  1.9  AST 28  --   --   ALT 22  --   --   ALKPHOS 109  --   --   BILITOT 1.0  --   --   < > = values in this interval not displayed. Cardiac Enzymes  Recent Labs Lab 02/16/17 1615  TROPONINI 0.04*   RADIOLOGY:  Ct Head Wo Contrast  Result Date: 02/16/2017 CLINICAL DATA:  Fall. Hit head on night stand. Seizure. Persistent confusion. EXAM: CT HEAD WITHOUT CONTRAST CT MAXILLOFACIAL WITHOUT CONTRAST CT CERVICAL SPINE WITHOUT CONTRAST TECHNIQUE: Multidetector CT imaging of the head, cervical spine, and maxillofacial structures were performed using the standard protocol without intravenous contrast. Multiplanar CT image reconstructions of the cervical spine and maxillofacial structures were also generated. COMPARISON:  None. FINDINGS: CT HEAD FINDINGS Brain: Mild generalized atrophy is present. No acute infarct, hemorrhage, or mass lesion is present. The ventricles are of normal size. No significant extraaxial fluid collection is  present. The brainstem and cerebellum are normal. The internal auditory canals are within normal limits. Vascular: No hyperdense vessel or unexpected calcification. Skull: Left periorbital soft tissue swelling is noted. There is no underlying fracture. The calvarium is intact. No significant extracranial soft tissue injury is present otherwise. CT MAXILLOFACIAL FINDINGS Osseous: No acute fracture is present. No focal lytic  or blastic lesions are present. Orbits: Left periorbital soft tissue swelling is present. The globes and orbits are within normal limits. No acute fracture is present. Sinuses: The paranasal sinuses and mastoid air cells are clear. Soft tissues: Soft tissues of the face are otherwise unremarkable. No additional soft tissue injury is evident. CT CERVICAL SPINE FINDINGS Alignment: AP alignment is anatomic. Mild rightward curvature is present. There is straightening and slight reversal of the normal cervical lordosis. Skull base and vertebrae: The skullbase is within normal limits. Vertebral body heights are maintained. No acute fracture is present. Soft tissues and spinal canal: The soft tissues of the neck demonstrate bilateral enlargement of the thyroid. No discrete mass lesion is present. There is no significant adenopathy. No other focal soft tissue abnormality is present. Disc levels:  No significant focal stenosis is evident. Upper chest: The visualized lung apices are clear. IMPRESSION: 1. Left periorbital soft tissue swelling without underlying fracture. 2. No acute intracranial abnormality. 3. No other significant facial injury. 4. Straightening and slight reversal of the normal cervical lordosis without evidence for acute fracture or traumatic subluxation in the cervical spine. 5. Enlarged thyroid gland without discrete lesion. Electronically Signed   By: Marin Roberts M.D.   On: 02/16/2017 16:58   Ct Cervical Spine Wo Contrast  Result Date: 02/16/2017 CLINICAL DATA:  Fall. Hit head on night stand. Seizure. Persistent confusion. EXAM: CT HEAD WITHOUT CONTRAST CT MAXILLOFACIAL WITHOUT CONTRAST CT CERVICAL SPINE WITHOUT CONTRAST TECHNIQUE: Multidetector CT imaging of the head, cervical spine, and maxillofacial structures were performed using the standard protocol without intravenous contrast. Multiplanar CT image reconstructions of the cervical spine and maxillofacial structures were also generated.  COMPARISON:  None. FINDINGS: CT HEAD FINDINGS Brain: Mild generalized atrophy is present. No acute infarct, hemorrhage, or mass lesion is present. The ventricles are of normal size. No significant extraaxial fluid collection is present. The brainstem and cerebellum are normal. The internal auditory canals are within normal limits. Vascular: No hyperdense vessel or unexpected calcification. Skull: Left periorbital soft tissue swelling is noted. There is no underlying fracture. The calvarium is intact. No significant extracranial soft tissue injury is present otherwise. CT MAXILLOFACIAL FINDINGS Osseous: No acute fracture is present. No focal lytic or blastic lesions are present. Orbits: Left periorbital soft tissue swelling is present. The globes and orbits are within normal limits. No acute fracture is present. Sinuses: The paranasal sinuses and mastoid air cells are clear. Soft tissues: Soft tissues of the face are otherwise unremarkable. No additional soft tissue injury is evident. CT CERVICAL SPINE FINDINGS Alignment: AP alignment is anatomic. Mild rightward curvature is present. There is straightening and slight reversal of the normal cervical lordosis. Skull base and vertebrae: The skullbase is within normal limits. Vertebral body heights are maintained. No acute fracture is present. Soft tissues and spinal canal: The soft tissues of the neck demonstrate bilateral enlargement of the thyroid. No discrete mass lesion is present. There is no significant adenopathy. No other focal soft tissue abnormality is present. Disc levels:  No significant focal stenosis is evident. Upper chest: The visualized lung apices are clear. IMPRESSION: 1. Left periorbital soft tissue  swelling without underlying fracture. 2. No acute intracranial abnormality. 3. No other significant facial injury. 4. Straightening and slight reversal of the normal cervical lordosis without evidence for acute fracture or traumatic subluxation in the  cervical spine. 5. Enlarged thyroid gland without discrete lesion. Electronically Signed   By: Marin Roberts M.D.   On: 02/16/2017 16:58   US Renal  Result Date: 02/17/2017 CLINICAL DATA:  Acute renal failure. EXAM: RENAL / URINARY TRACT ULTRASOUND COMPLETE COMPARISON:  01/24/2017 FINDINGS: The examination was limited as the patient was intubated and unable to follow breathing instructions. Right Kidney: Length: 9.0 cm. Increased parenchymal echogenicity. 1.4 cm hypoechoic upper pole lesion was better characterized as a simple cyst on the recent prior ultrasound. No hydronephrosis. Left Kidney: Length: 9.7 cm. Increased parenchymal echogenicity. 1.2 cm the hypoechoic interpolar lesion was better characterized as a simple cyst on the recent prior ultrasound. No hydronephrosis. Bladder: Decompressed by Foley catheter. IMPRESSION: Echogenic kidneys compatible with medical renal disease. No hydronephrosis. Electronically Signed   By: Sebastian Ache M.D.   On: 02/17/2017 11:38   Portable Chest Xray  Result Date: 02/17/2017 CLINICAL DATA:  Respiratory failure. EXAM: PORTABLE CHEST 1 VIEW COMPARISON:  02/16/2017. FINDINGS: Endotracheal tube and NG tube in stable position. Heart size normal. Mild bibasilar subsegmental atelectasis. No pleural effusion or pneumothorax. IMPRESSION: 1. Lines and tubes in stable position. 2. Low lung volumes with mild bibasilar subsegmental atelectasis. Electronically Signed   By: Maisie Fus  Register   On: 02/17/2017 06:49   Dg Chest Portable 1 View  Result Date: 02/16/2017 CLINICAL DATA:  56 year old male status post intubation. EXAM: PORTABLE CHEST 1 VIEW COMPARISON:  Chest x-ray a 02/16/2017. FINDINGS: An endotracheal tube is in place with tip 2.8 cm above the carina. A nasogastric tube is seen extending into the stomach, however, the tip of the nasogastric tube extends below the lower margin of the image. Lung volumes are low. No consolidative airspace disease. No pleural  effusions. No pneumothorax. No suspicious appearing pulmonary nodules or masses. Heart size is normal. Upper mediastinal contours are within normal limits. IMPRESSION: 1. Support apparatus, as above. 2. Low lung volumes without radiographic evidence of acute cardiopulmonary disease. Electronically Signed   By: Trudie Reed M.D.   On: 02/16/2017 19:29   Dg Chest Port 1 View  Result Date: 02/16/2017 CLINICAL DATA:  Recent seizure and fall, initial encounter EXAM: PORTABLE CHEST 1 VIEW COMPARISON:  None. FINDINGS: Cardiac shadow is within normal limits. The lungs are well aerated bilaterally. No acute bony abnormality is seen. IMPRESSION: No acute abnormality noted. Electronically Signed   By: Alcide Clever M.D.   On: 02/16/2017 17:11   Ct Maxillofacial Wo Contrast  Result Date: 02/16/2017 CLINICAL DATA:  Fall. Hit head on night stand. Seizure. Persistent confusion. EXAM: CT HEAD WITHOUT CONTRAST CT MAXILLOFACIAL WITHOUT CONTRAST CT CERVICAL SPINE WITHOUT CONTRAST TECHNIQUE: Multidetector CT imaging of the head, cervical spine, and maxillofacial structures were performed using the standard protocol without intravenous contrast. Multiplanar CT image reconstructions of the cervical spine and maxillofacial structures were also generated. COMPARISON:  None. FINDINGS: CT HEAD FINDINGS Brain: Mild generalized atrophy is present. No acute infarct, hemorrhage, or mass lesion is present. The ventricles are of normal size. No significant extraaxial fluid collection is present. The brainstem and cerebellum are normal. The internal auditory canals are within normal limits. Vascular: No hyperdense vessel or unexpected calcification. Skull: Left periorbital soft tissue swelling is noted. There is no underlying fracture. The calvarium is intact. No significant  extracranial soft tissue injury is present otherwise. CT MAXILLOFACIAL FINDINGS Osseous: No acute fracture is present. No focal lytic or blastic lesions are present.  Orbits: Left periorbital soft tissue swelling is present. The globes and orbits are within normal limits. No acute fracture is present. Sinuses: The paranasal sinuses and mastoid air cells are clear. Soft tissues: Soft tissues of the face are otherwise unremarkable. No additional soft tissue injury is evident. CT CERVICAL SPINE FINDINGS Alignment: AP alignment is anatomic. Mild rightward curvature is present. There is straightening and slight reversal of the normal cervical lordosis. Skull base and vertebrae: The skullbase is within normal limits. Vertebral body heights are maintained. No acute fracture is present. Soft tissues and spinal canal: The soft tissues of the neck demonstrate bilateral enlargement of the thyroid. No discrete mass lesion is present. There is no significant adenopathy. No other focal soft tissue abnormality is present. Disc levels:  No significant focal stenosis is evident. Upper chest: The visualized lung apices are clear. IMPRESSION: 1. Left periorbital soft tissue swelling without underlying fracture. 2. No acute intracranial abnormality. 3. No other significant facial injury. 4. Straightening and slight reversal of the normal cervical lordosis without evidence for acute fracture or traumatic subluxation in the cervical spine. 5. Enlarged thyroid gland without discrete lesion. Electronically Signed   By: Marin Roberts M.D.   On: 02/16/2017 16:58   ASSESSMENT AND PLAN:  Jason Diaz  is a 56 y.o. male with a known history of Chronic kidney disease stage IV baseline creatinine around 3.4, GERD, schizoaffective disorder., Hypertension comes to the emergency room from [facility] after patient slipping in his urine causing him to fall and hitting his head on the nightstand. Staff noted shaking questionable seizure received 4 mg of Versed. Patient was quite confused came to the emergency room and was found to be in septic shock. His white count was elevated to 17,000 was  tachycardic and creatinine was 15.4 with potassium of 7.5.  1. Acute metabolic encephalopathy multifactorial secondary to septic shock, acute on chronic severe renal failure with dehydration -Patient will be intubated placed on the ventilator -Patient is on IV propofol and fentanyl drip. -Continue to monitor for seizure activity. It seems patient is encephalopathy from uremia and likely causes shaking/tremors -Consider neurology consultation if needed  2. Septic shock source appears urine -Patient was discharged 01/28/2017 with an indwelling Foley catheter given significant hematuria and difficulty urination. He was supposed to follow with urology not sure if he made to the appointment -Patient came in with a Foley catheter that was deeply and that it had to be cut off with very turbid urine -IV cefepime and vancomycin -Follow up urine culture -blood culture  negative.  3. Acute on chronic kidney failure stage IV/5 -Baseline creatinine 3.35------ came in with creatinine of 15.49--- continue hemodialysis -Nephrology consultation placed with Dr. Thedore Mins  4. Acute severe hyperkalemia -Patient received IV bicarbonate, dextrose with insulin and calcium gluconate -Follow up potassium with dialysis -Came in with potassium of 7.5----3.5  5. Leukocytosis due to #2  6. DVT prophylaxis subcutaneous heparin  7. Possible seizures versus jerky/tremor movements and to uremic encephalopathy -Empirically on IV Keppra -Appreciate neurology input.   Case discussed with Care Management/Social Worker. Management plans discussed with the patient, family and they are in agreement.  CODE STATUS: full  DVT Prophylaxis: heparin  TOTAL TIME TAKING CARE OF THIS PATIENT: *30* minutes.  >50% time spent on counselling and coordination of care  POSSIBLE D/C IN 1-2 DAYS,  DEPENDING ON CLINICAL CONDITION.  Note: This dictation was prepared with Dragon dictation along with smaller phrase technology.  Any transcriptional errors that result from this process are unintentional.  Sophina Mitten M.D on 02/17/2017 at 2:51 PM  Between 7am to 6pm - Pager - [phone]  After 6pm go to [email or web address] - Social research officer, governmentpassword EPAS [facility]  [facility]  Office  [phone]  CC: Primary care physician; Armando GangLindley, Cheryl P, FNP

## 2017-02-18 ENCOUNTER — Ambulatory Visit: Payer: Self-pay

## 2017-02-18 DIAGNOSIS — R569 Unspecified convulsions: Secondary | ICD-10-CM

## 2017-02-18 DIAGNOSIS — J9600 Acute respiratory failure, unspecified whether with hypoxia or hypercapnia: Secondary | ICD-10-CM

## 2017-02-18 LAB — VALPROIC ACID LEVEL: Valproic Acid Lvl: 25 ug/mL — ABNORMAL LOW (ref 50.0–100.0)

## 2017-02-18 LAB — GLUCOSE, CAPILLARY
GLUCOSE-CAPILLARY: 88 mg/dL (ref 65–99)
GLUCOSE-CAPILLARY: 95 mg/dL (ref 65–99)
Glucose-Capillary: 102 mg/dL — ABNORMAL HIGH (ref 65–99)
Glucose-Capillary: 76 mg/dL (ref 65–99)

## 2017-02-18 LAB — HEPATITIS B SURFACE ANTIBODY,QUALITATIVE: Hep B S Ab: NONREACTIVE

## 2017-02-18 LAB — HEPATITIS B CORE ANTIBODY, TOTAL: Hep B Core Total Ab: NEGATIVE

## 2017-02-18 LAB — PROCALCITONIN: Procalcitonin: 12.87 ng/mL

## 2017-02-18 LAB — HEPATITIS B SURFACE ANTIGEN: Hepatitis B Surface Ag: NEGATIVE

## 2017-02-18 MED ORDER — PRO-STAT SUGAR FREE PO LIQD: 60.0000 mL | Freq: Three times a day (TID) | ORAL | Status: DC

## 2017-02-18 MED ORDER — IPRATROPIUM-ALBUTEROL 0.5-2.5 (3) MG/3ML IN SOLN
3.0000 mL | Freq: Four times a day (QID) | RESPIRATORY_TRACT | Status: DC
  Administered 2017-02-18: 3 mL via RESPIRATORY_TRACT
  Filled 2017-02-18 (×4): qty 3

## 2017-02-18 MED ORDER — BUDESONIDE 0.25 MG/2ML IN SUSP
0.2500 mg | Freq: Four times a day (QID) | RESPIRATORY_TRACT | Status: DC
  Administered 2017-02-18: 0.25 mg via RESPIRATORY_TRACT
  Filled 2017-02-18 (×4): qty 2

## 2017-02-18 MED ORDER — DEXTROSE 5 % IV SOLN
1.0000 g | Freq: Every day | INTRAVENOUS | Status: AC
  Administered 2017-02-18 – 2017-02-22 (×5): 1 g via INTRAVENOUS
  Filled 2017-02-18 (×6): qty 1

## 2017-02-18 MED ORDER — QUETIAPINE FUMARATE 25 MG PO TABS
200.0000 mg | ORAL_TABLET | Freq: Two times a day (BID) | ORAL | Status: DC
  Administered 2017-02-19 – 2017-02-22 (×7): 200 mg via ORAL
  Filled 2017-02-18 (×6): qty 8
  Filled 2017-02-18: qty 1
  Filled 2017-02-18 (×2): qty 8

## 2017-02-18 MED ORDER — VALPROATE SODIUM 500 MG/5ML IV SOLN
1000.0000 mg | Freq: Two times a day (BID) | INTRAVENOUS | Status: DC
  Administered 2017-02-18 – 2017-02-19 (×2): 1000 mg via INTRAVENOUS
  Filled 2017-02-18 (×4): qty 10

## 2017-02-18 MED ORDER — NEPRO/CARBSTEADY PO LIQD: 1000.0000 mL | ORAL | Status: DC

## 2017-02-18 MED ORDER — LACTATED RINGERS IV SOLN
INTRAVENOUS | Status: DC
  Administered 2017-02-18: 12:00:00 via INTRAVENOUS

## 2017-02-18 NOTE — Progress Notes (Signed)
Subjective: Patient extubated.  Agitated.  No speech.  Some twitching noted overnight.  Objective: Current vital signs: BP 124/74   Pulse 94   Temp 98.7 F (37.1 C)   Resp 15   Ht 5' 10"  (1.778 m)   Wt 84.1 kg (185 lb 6.5 oz)   SpO2 97%   BMI 26.60 kg/m  Vital signs in last 24 hours: Temp:  [98.6 F (37 C)-100 F (37.8 C)] 98.7 F (37.1 C) (07/17 1600) Pulse Rate:  [81-124] 94 (07/17 1800) Resp:  [14-20] 15 (07/17 1800) BP: (73-143)/(39-87) 124/74 (07/17 1800) SpO2:  [94 %-100 %] 97 % (07/17 1800) FiO2 (%):  [30 %] 30 % (07/17 0805) Weight:  [84.1 kg (185 lb 6.5 oz)] 84.1 kg (185 lb 6.5 oz) (07/17 0558)  Intake/Output from previous day: 07/16 0701 - 07/17 0700 In: 1717.6 [I.V.:1068.3; NG/GT:184.3; IV Piggyback:465] Out: 835 [Urine:835] Intake/Output this shift: No intake/output data recorded. Nutritional status: Diet NPO time specified  Neurologic Exam: Mental Status: Alert.  Does no follow commands.  No speech.   Cranial Nerves: II: Discs flat bilaterally; Blinks to bilateral confrontation, pupils equal, round, reactive to light and accommodation III,IV, VI: ptosis not present, extra-ocular motions intact bilaterally V,VII: corneals intact bilaterally VIII: unable to be tested IX,X: gag reflex present XI: bilateral shoulder shrug XII: unable to be tested Motor: Moves all extremities against gravity spontaneously.  No focal weakness noted.   Sensory: Responds to noxious stimuli throughout   Lab Results: Basic Metabolic Panel:  Recent Labs Lab 02/16/17 1615 02/16/17 1710 02/16/17 2140 02/16/17 2236 02/17/17 0500  NA 134* 133*  --   --  138  K UNABLE TO REPORT DUE TO HEMOLYSIS 7.5* 3.8 3.6 3.5  CL 102 102  --   --  97*  CO2 10* 11*  --   --  27  GLUCOSE 143* 137*  --   --  115*  BUN 140* 134*  --   --  71*  CREATININE 15.49* 15.14*  --   --  8.39*  CALCIUM 9.8 9.5  --   --  8.3*  MG  --   --   --   --  1.9  PHOS  --   --   --   --  6.8*    Liver  Function Tests:  Recent Labs Lab 02/16/17 1615 02/16/17 1710  AST UNABLE TO REPORT DUE TO HEMOLYSIS 28  ALT 18 22  ALKPHOS 114 109  BILITOT 1.5* 1.0  PROT 8.2* 7.9  ALBUMIN 3.1* 3.0*   No results for input(s): LIPASE, AMYLASE in the last 168 hours. No results for input(s): AMMONIA in the last 168 hours.  CBC:  Recent Labs Lab 02/16/17 1615 02/17/17 0500  WBC 17.6* 12.2*  NEUTROABS 16.7*  --   HGB 10.0* 8.0*  HCT 29.6* 23.2*  MCV 88.8 85.9  PLT 398 304    Cardiac Enzymes:  Recent Labs Lab 02/16/17 1615  TROPONINI 0.04*    Lipid Panel:  Recent Labs Lab 02/16/17 2051  TRIG 265*    CBG:  Recent Labs Lab 02/17/17 2044 02/18/17 0006 02/18/17 0408 02/18/17 0728 02/18/17 1141  GLUCAP 77 102* 80 95 61    Microbiology: Results for orders placed or performed during the hospital encounter of 02/16/17  Urine Culture     Status: Abnormal (Preliminary result)   Collection Time: 02/16/17  4:15 PM  Result Value Ref Range Status   Specimen Description URINE, RANDOM  Final   Special Requests  NONE  Final   Culture (A)  Final    >=100,000 COLONIES/mL CITROBACTER KOSERI 50,000 COLONIES/mL PSEUDOMONAS AERUGINOSA SUSCEPTIBILITIES TO FOLLOW Performed at [facility], [address], [place], [place] [address]    Report Status PENDING  Incomplete  Culture, blood (Routine X 2) w Reflex to ID Panel     Status: None (Preliminary result)   Collection Time: 02/16/17  8:42 PM  Result Value Ref Range Status   Specimen Description BLOOD LEFT ARM  Final   Special Requests   Final    BOTTLES DRAWN AEROBIC AND ANAEROBIC Blood Culture adequate volume   Culture NO GROWTH 2 DAYS  Final   Report Status PENDING  Incomplete  Culture, blood (Routine X 2) w Reflex to ID Panel     Status: None (Preliminary result)   Collection Time: 02/16/17  8:52 PM  Result Value Ref Range Status   Specimen Description BLOOD LEFT HAND  Final   Special Requests   Final    BOTTLES DRAWN  AEROBIC AND ANAEROBIC Blood Culture adequate volume   Culture NO GROWTH 2 DAYS  Final   Report Status PENDING  Incomplete  MRSA PCR Screening     Status: None   Collection Time: 02/16/17  9:00 PM  Result Value Ref Range Status   MRSA by PCR NEGATIVE NEGATIVE Final    Comment:        The GeneXpert MRSA Assay (FDA approved for NASAL specimens only), is one component of a comprehensive MRSA colonization surveillance program. It is not intended to diagnose MRSA infection nor to guide or monitor treatment for MRSA infections.     Coagulation Studies: No results for input(s): LABPROT, INR in the last 72 hours.  Imaging: Dg Abd 1 View  Result Date: 02/17/2017 CLINICAL DATA:  Initial evaluation for OG tube placement. EXAM: ABDOMEN - 1 VIEW COMPARISON:  None. FINDINGS: Enteric tube visualized at the upper abdomen, tip in side hole overlying the stomach. Tip projects distally. Visualized bowel gas pattern within normal limits without evidence for obstruction or ileus. No abnormal bowel wall thickening. No soft tissue mass or abnormal calcification. IMPRESSION: Tip in side hole of enteric tube overlying the stomach, beyond the GE junction. Tip projects distally. Electronically Signed   By: Jeannine Boga M.D.   On: 02/17/2017 19:23   US Renal  Result Date: 02/17/2017 CLINICAL DATA:  Acute renal failure. EXAM: RENAL / URINARY TRACT ULTRASOUND COMPLETE COMPARISON:  01/24/2017 FINDINGS: The examination was limited as the patient was intubated and unable to follow breathing instructions. Right Kidney: Length: 9.0 cm. Increased parenchymal echogenicity. 1.4 cm hypoechoic upper pole lesion was better characterized as a simple cyst on the recent prior ultrasound. No hydronephrosis. Left Kidney: Length: 9.7 cm. Increased parenchymal echogenicity. 1.2 cm the hypoechoic interpolar lesion was better characterized as a simple cyst on the recent prior ultrasound. No hydronephrosis. Bladder: Decompressed  by Foley catheter. IMPRESSION: Echogenic kidneys compatible with medical renal disease. No hydronephrosis. Electronically Signed   By: Logan Bores M.D.   On: 02/17/2017 11:38   Portable Chest Xray  Result Date: 02/17/2017 CLINICAL DATA:  Respiratory failure. EXAM: PORTABLE CHEST 1 VIEW COMPARISON:  02/16/2017. FINDINGS: Endotracheal tube and NG tube in stable position. Heart size normal. Mild bibasilar subsegmental atelectasis. No pleural effusion or pneumothorax. IMPRESSION: 1. Lines and tubes in stable position. 2. Low lung volumes with mild bibasilar subsegmental atelectasis. Electronically Signed   By: Marcello Moores  Register   On: 02/17/2017 06:49  Medications:  I have reviewed the patient's current medications. Scheduled: . bacitracin   Left Eye BID  . budesonide (PULMICORT) nebulizer solution  0.25 mg Nebulization Q6H  . chlorhexidine gluconate (MEDLINE KIT)  15 mL Mouth Rinse BID  . fluPHENAZine  5 mg Oral QHS  . heparin  5,000 Units Subcutaneous Q8H  . ipratropium-albuterol  3 mL Nebulization Q6H  . mouth rinse  15 mL Mouth Rinse QID  . QUEtiapine  200 mg Oral BID    Assessment/Plan: Patient awake but remains significantly altered.  EEG reviewed and shows triphasic activity with some concern for epileptic etiology.  Patient started on Depakote yesterday.  Level today 25.  With patient remaining altered and with some twitching overnight will further advance Depakote until level therapeutic.   Keppra added overnight but due to renal dysfunction and less than optimal ise of Depakote will not continue at this time.    Recommendations: 1. D/C Kepppra 2.  Increase Depakote to 1043m q 12 hours 3.  Depakote level in AM   LOS: 2 days   LAlexis Goodell MD Neurology [id number]/17/2018  7:38 PM

## 2017-02-18 NOTE — Progress Notes (Signed)
Extubated at 0800 to room air. Very combative. Mitts placed on hands to prevent pulling of lines etc. Refuses to speak or even make eye contact- questionable part of his Schizo-effective disorder. Temporary sitter in room for safety- patient remained calmer and slept. 1430 mother in. Patient refused to speak to mother. Did make eye contact with mom. VSS. Remains in Sinus Tac.  Drowsy most of the day,sleeping at times.

## 2017-02-18 NOTE — Progress Notes (Addendum)
Passed SBT this a.m. and extubated. Tolerating well. Remains lethargic. Not following commands.  Vitals:   02/18/17 0900 02/18/17 1000 02/18/17 1100 02/18/17 1200  BP: (!) 98/48 (!) 89/54 110/64 112/75  Pulse: (!) 107 (!) 106 (!) 105 (!) 106  Resp: 17 16 19 19   Temp:    99 F (37.2 C)  TempSrc:      SpO2: 97% 97% 94% 95%  Weight:      Height:        NAD HEENT WNL Diffuse scattered wheezes Regular, no murmurs NABS, NT, soft Extremities warm, no edema No focal neurologic deficits   BMP Latest Ref Rng & Units 02/17/2017 02/16/2017 02/16/2017  Glucose 65 - 99 mg/dL 161(W115(H) - -  BUN 6 - 20 mg/dL 96(E71(H) - -  Creatinine 4.540.61 - 1.24 mg/dL 0.98(J8.39(H) - -  Sodium 191135 - 145 mmol/L 138 - -  Potassium 3.5 - 5.1 mmol/L 3.5 3.6 3.8  Chloride 101 - 111 mmol/L 97(L) - -  CO2 22 - 32 mmol/L 27 - -  Calcium 8.9 - 10.3 mg/dL 8.3(L) - -   CBC Latest Ref Rng & Units 02/17/2017 02/16/2017 01/27/2017  WBC 3.8 - 10.6 K/uL 12.2(H) 17.6(H) -  Hemoglobin 13.0 - 18.0 g/dL 8.0(L) 10.0(L) 13.2  Hematocrit 40.0 - 52.0 % 23.2(L) 29.6(L) -  Platelets 150 - 440 K/uL 304 398 -   CXR: NACPD  IMPRESSION: Acute respiratory failure, resolved Wheezing without respiratory distress Acute seizure, controlled AKI/CKD, oliguric - S/P HD 07/16 Hyperkalemia, resolved Elevated PCT, suspected UTI Psychiatric history  PLAN/REC: Extubated this morning under my supervision - tolerating well  Begin nebulized steroids and BDs Possible transfer to general medical floor later today Anticonvulsant therapy per neurology Management of renal failure per nephrology Continue antibiotics (ceftriaxone) Continue neuroleptic therapy  Billy Fischeravid Simonds, MD PCCM service Mobile [phone] Pager [phone] 02/18/2017 2:05 PM

## 2017-02-18 NOTE — Progress Notes (Signed)
[facility] [place] [facility]   PATIENT NAME: Jason Diaz    MR#:  [id number]  DATE OF BIRTH:  October 01, 1960  SUBJECTIVE:  Remains critically ill  But extubated today  Per RN gets agitated  REVIEW OF SYSTEMS:   Review of Systems  Constitutional: Negative for chills, fever and weight loss.  HENT: Negative for ear discharge, ear pain and nosebleeds.   Eyes: Negative for blurred vision, pain and discharge.  Respiratory: Negative for sputum production, shortness of breath, wheezing and stridor.   Cardiovascular: Negative for chest pain, palpitations, orthopnea and PND.  Gastrointestinal: Negative for abdominal pain, diarrhea, nausea and vomiting.  Genitourinary: Negative for frequency and urgency.  Musculoskeletal: Negative for back pain and joint pain.  Neurological: Positive for weakness. Negative for sensory change, speech change and focal weakness.  Psychiatric/Behavioral: Negative for depression and hallucinations. The patient is not nervous/anxious.    Tolerating Diet: Nothing by mouth Tolerating PT: pending  DRUG ALLERGIES:  No Known Allergies  VITALS:  Blood pressure 110/64, pulse (!) 105, temperature 99.3 F (37.4 C), resp. rate 19, height 5\' 10"  (1.778 m), weight 84.1 kg (185 lb 6.5 oz), SpO2 94 %.  PHYSICAL EXAMINATION:   Physical Exam  GENERAL:  56 y.o.-year-old patient lying in the bed with no acute distress. Critically ill EYES: Pupils equal, round, reactive to light and accommodation. No scleral icterus. Extraocular muscles intact.  HEENT: Head atraumatic, normocephalic. Oropharynx and nasopharynx clear. NECK:  Supple, no jugular venous distention. No thyroid enlargement, no tenderness.  LUNGS: Normal breath sounds bilaterally, no wheezing, rales, rhonchi. No use of accessory muscles of respiration.  CARDIOVASCULAR: S1, S2 normal. No murmurs, rubs, or gallops.  ABDOMEN: Soft, nontender, nondistended. Bowel sounds present. No  organomegaly or mass.  EXTREMITIES: No cyanosis, clubbing or edema b/l.    NEUROLOGIC:moves extremities well spontaneously PSYCHIATRIC: agitation SKIN: No obvious rash, lesion, or ulcer.   LABORATORY PANEL:  CBC  Recent Labs Lab 02/17/17 0500  WBC 12.2*  HGB 8.0*  HCT 23.2*  PLT 304    Chemistries   Recent Labs Lab 02/16/17 1710  02/17/17 0500  NA 133*  --  138  K 7.5*  < > 3.5  CL 102  --  97*  CO2 11*  --  27  GLUCOSE 137*  --  115*  BUN 134*  --  71*  CREATININE 15.14*  --  8.39*  CALCIUM 9.5  --  8.3*  MG  --   --  1.9  AST 28  --   --   ALT 22  --   --   ALKPHOS 109  --   --   BILITOT 1.0  --   --   < > = values in this interval not displayed. Cardiac Enzymes  Recent Labs Lab 02/16/17 1615  TROPONINI 0.04*   RADIOLOGY:  Dg Abd 1 View  Result Date: 02/17/2017 CLINICAL DATA:  Initial evaluation for OG tube placement. EXAM: ABDOMEN - 1 VIEW COMPARISON:  None. FINDINGS: Enteric tube visualized at the upper abdomen, tip in side hole overlying the stomach. Tip projects distally. Visualized bowel gas pattern within normal limits without evidence for obstruction or ileus. No abnormal bowel wall thickening. No soft tissue mass or abnormal calcification. IMPRESSION: Tip in side hole of enteric tube overlying the stomach, beyond the GE junction. Tip projects distally. Electronically Signed   By: Rise Mu M.D.   On: 02/17/2017 19:23   Ct Head Wo Contrast  Result  Date: 02/16/2017 CLINICAL DATA:  Fall. Hit head on night stand. Seizure. Persistent confusion. EXAM: CT HEAD WITHOUT CONTRAST CT MAXILLOFACIAL WITHOUT CONTRAST CT CERVICAL SPINE WITHOUT CONTRAST TECHNIQUE: Multidetector CT imaging of the head, cervical spine, and maxillofacial structures were performed using the standard protocol without intravenous contrast. Multiplanar CT image reconstructions of the cervical spine and maxillofacial structures were also generated. COMPARISON:  None. FINDINGS: CT HEAD  FINDINGS Brain: Mild generalized atrophy is present. No acute infarct, hemorrhage, or mass lesion is present. The ventricles are of normal size. No significant extraaxial fluid collection is present. The brainstem and cerebellum are normal. The internal auditory canals are within normal limits. Vascular: No hyperdense vessel or unexpected calcification. Skull: Left periorbital soft tissue swelling is noted. There is no underlying fracture. The calvarium is intact. No significant extracranial soft tissue injury is present otherwise. CT MAXILLOFACIAL FINDINGS Osseous: No acute fracture is present. No focal lytic or blastic lesions are present. Orbits: Left periorbital soft tissue swelling is present. The globes and orbits are within normal limits. No acute fracture is present. Sinuses: The paranasal sinuses and mastoid air cells are clear. Soft tissues: Soft tissues of the face are otherwise unremarkable. No additional soft tissue injury is evident. CT CERVICAL SPINE FINDINGS Alignment: AP alignment is anatomic. Mild rightward curvature is present. There is straightening and slight reversal of the normal cervical lordosis. Skull base and vertebrae: The skullbase is within normal limits. Vertebral body heights are maintained. No acute fracture is present. Soft tissues and spinal canal: The soft tissues of the neck demonstrate bilateral enlargement of the thyroid. No discrete mass lesion is present. There is no significant adenopathy. No other focal soft tissue abnormality is present. Disc levels:  No significant focal stenosis is evident. Upper chest: The visualized lung apices are clear. IMPRESSION: 1. Left periorbital soft tissue swelling without underlying fracture. 2. No acute intracranial abnormality. 3. No other significant facial injury. 4. Straightening and slight reversal of the normal cervical lordosis without evidence for acute fracture or traumatic subluxation in the cervical spine. 5. Enlarged thyroid  gland without discrete lesion. Electronically Signed   By: Marin Roberts M.D.   On: 02/16/2017 16:58   Ct Cervical Spine Wo Contrast  Result Date: 02/16/2017 CLINICAL DATA:  Fall. Hit head on night stand. Seizure. Persistent confusion. EXAM: CT HEAD WITHOUT CONTRAST CT MAXILLOFACIAL WITHOUT CONTRAST CT CERVICAL SPINE WITHOUT CONTRAST TECHNIQUE: Multidetector CT imaging of the head, cervical spine, and maxillofacial structures were performed using the standard protocol without intravenous contrast. Multiplanar CT image reconstructions of the cervical spine and maxillofacial structures were also generated. COMPARISON:  None. FINDINGS: CT HEAD FINDINGS Brain: Mild generalized atrophy is present. No acute infarct, hemorrhage, or mass lesion is present. The ventricles are of normal size. No significant extraaxial fluid collection is present. The brainstem and cerebellum are normal. The internal auditory canals are within normal limits. Vascular: No hyperdense vessel or unexpected calcification. Skull: Left periorbital soft tissue swelling is noted. There is no underlying fracture. The calvarium is intact. No significant extracranial soft tissue injury is present otherwise. CT MAXILLOFACIAL FINDINGS Osseous: No acute fracture is present. No focal lytic or blastic lesions are present. Orbits: Left periorbital soft tissue swelling is present. The globes and orbits are within normal limits. No acute fracture is present. Sinuses: The paranasal sinuses and mastoid air cells are clear. Soft tissues: Soft tissues of the face are otherwise unremarkable. No additional soft tissue injury is evident. CT CERVICAL SPINE FINDINGS Alignment: AP alignment  is anatomic. Mild rightward curvature is present. There is straightening and slight reversal of the normal cervical lordosis. Skull base and vertebrae: The skullbase is within normal limits. Vertebral body heights are maintained. No acute fracture is present. Soft tissues and  spinal canal: The soft tissues of the neck demonstrate bilateral enlargement of the thyroid. No discrete mass lesion is present. There is no significant adenopathy. No other focal soft tissue abnormality is present. Disc levels:  No significant focal stenosis is evident. Upper chest: The visualized lung apices are clear. IMPRESSION: 1. Left periorbital soft tissue swelling without underlying fracture. 2. No acute intracranial abnormality. 3. No other significant facial injury. 4. Straightening and slight reversal of the normal cervical lordosis without evidence for acute fracture or traumatic subluxation in the cervical spine. 5. Enlarged thyroid gland without discrete lesion. Electronically Signed   By: Marin Robertshristopher  Mattern M.D.   On: 02/16/2017 16:58   [place] Renal  Result Date: 02/17/2017 CLINICAL DATA:  Acute renal failure. EXAM: RENAL / URINARY TRACT ULTRASOUND COMPLETE COMPARISON:  01/24/2017 FINDINGS: The examination was limited as the patient was intubated and unable to follow breathing instructions. Right Kidney: Length: 9.0 cm. Increased parenchymal echogenicity. 1.4 cm hypoechoic upper pole lesion was better characterized as a simple cyst on the recent prior ultrasound. No hydronephrosis. Left Kidney: Length: 9.7 cm. Increased parenchymal echogenicity. 1.2 cm the hypoechoic interpolar lesion was better characterized as a simple cyst on the recent prior ultrasound. No hydronephrosis. Bladder: Decompressed by Foley catheter. IMPRESSION: Echogenic kidneys compatible with medical renal disease. No hydronephrosis. Electronically Signed   By: Sebastian AcheAllen  Grady M.D.   On: 02/17/2017 11:38   Portable Chest Xray  Result Date: 02/17/2017 CLINICAL DATA:  Respiratory failure. EXAM: PORTABLE CHEST 1 VIEW COMPARISON:  02/16/2017. FINDINGS: Endotracheal tube and NG tube in stable position. Heart size normal. Mild bibasilar subsegmental atelectasis. No pleural effusion or pneumothorax. IMPRESSION: 1. Lines and tubes in  stable position. 2. Low lung volumes with mild bibasilar subsegmental atelectasis. Electronically Signed   By: Maisie Fushomas  Register   On: 02/17/2017 06:49   Dg Chest Portable 1 View  Result Date: 02/16/2017 CLINICAL DATA:  56 year old male status post intubation. EXAM: PORTABLE CHEST 1 VIEW COMPARISON:  Chest x-ray a 02/16/2017. FINDINGS: An endotracheal tube is in place with tip 2.8 cm above the carina. A nasogastric tube is seen extending into the stomach, however, the tip of the nasogastric tube extends below the lower margin of the image. Lung volumes are low. No consolidative airspace disease. No pleural effusions. No pneumothorax. No suspicious appearing pulmonary nodules or masses. Heart size is normal. Upper mediastinal contours are within normal limits. IMPRESSION: 1. Support apparatus, as above. 2. Low lung volumes without radiographic evidence of acute cardiopulmonary disease. Electronically Signed   By: Trudie Reedaniel  Entrikin M.D.   On: 02/16/2017 19:29   Dg Chest Port 1 View  Result Date: 02/16/2017 CLINICAL DATA:  Recent seizure and fall, initial encounter EXAM: PORTABLE CHEST 1 VIEW COMPARISON:  None. FINDINGS: Cardiac shadow is within normal limits. The lungs are well aerated bilaterally. No acute bony abnormality is seen. IMPRESSION: No acute abnormality noted. Electronically Signed   By: Alcide CleverMark  Lukens M.D.   On: 02/16/2017 17:11   Ct Maxillofacial Wo Contrast  Result Date: 02/16/2017 CLINICAL DATA:  Fall. Hit head on night stand. Seizure. Persistent confusion. EXAM: CT HEAD WITHOUT CONTRAST CT MAXILLOFACIAL WITHOUT CONTRAST CT CERVICAL SPINE WITHOUT CONTRAST TECHNIQUE: Multidetector CT imaging of the head, cervical spine, and maxillofacial structures were  performed using the standard protocol without intravenous contrast. Multiplanar CT image reconstructions of the cervical spine and maxillofacial structures were also generated. COMPARISON:  None. FINDINGS: CT HEAD FINDINGS Brain: Mild generalized  atrophy is present. No acute infarct, hemorrhage, or mass lesion is present. The ventricles are of normal size. No significant extraaxial fluid collection is present. The brainstem and cerebellum are normal. The internal auditory canals are within normal limits. Vascular: No hyperdense vessel or unexpected calcification. Skull: Left periorbital soft tissue swelling is noted. There is no underlying fracture. The calvarium is intact. No significant extracranial soft tissue injury is present otherwise. CT MAXILLOFACIAL FINDINGS Osseous: No acute fracture is present. No focal lytic or blastic lesions are present. Orbits: Left periorbital soft tissue swelling is present. The globes and orbits are within normal limits. No acute fracture is present. Sinuses: The paranasal sinuses and mastoid air cells are clear. Soft tissues: Soft tissues of the face are otherwise unremarkable. No additional soft tissue injury is evident. CT CERVICAL SPINE FINDINGS Alignment: AP alignment is anatomic. Mild rightward curvature is present. There is straightening and slight reversal of the normal cervical lordosis. Skull base and vertebrae: The skullbase is within normal limits. Vertebral body heights are maintained. No acute fracture is present. Soft tissues and spinal canal: The soft tissues of the neck demonstrate bilateral enlargement of the thyroid. No discrete mass lesion is present. There is no significant adenopathy. No other focal soft tissue abnormality is present. Disc levels:  No significant focal stenosis is evident. Upper chest: The visualized lung apices are clear. IMPRESSION: 1. Left periorbital soft tissue swelling without underlying fracture. 2. No acute intracranial abnormality. 3. No other significant facial injury. 4. Straightening and slight reversal of the normal cervical lordosis without evidence for acute fracture or traumatic subluxation in the cervical spine. 5. Enlarged thyroid gland without discrete lesion.  Electronically Signed   By: Marin Roberts M.D.   On: 02/16/2017 16:58   ASSESSMENT AND PLAN:  Jason Diaz  is a 57 y.o. male with a known history of Chronic kidney disease stage IV baseline creatinine around 3.4, GERD, schizoaffective disorder., Hypertension comes to the emergency room from [facility] after patient slipping in his urine causing him to fall and hitting his head on the nightstand. Staff noted shaking questionable seizure received 4 mg of Versed. Patient was quite confused came to the emergency room and was found to be in septic shock. His white count was elevated to 17,000 was tachycardic and creatinine was 15.4 with potassium of 7.5.  1. Acute metabolic encephalopathy multifactorial secondary to septic shock, acute on chronic severe renal failure with dehydration -Patient was intubated placed on the ventilator---extubated 02/18/17 -Continue to monitor for seizure activity. -Appreciate neurology consultation. Patient on IV Keppra.  2. Septic shock source appears urine -Patient was discharged 01/28/2017 with an indwelling Foley catheter given significant hematuria and difficulty urination. He was supposed to follow with urology not sure if he made to the appointment -IV cefepime  - urine culture growing more than 100,000 gram-negative rods -blood culture  negative.  3. Acute on chronic kidney failure stage IV/5 -Baseline creatinine 3.35------ came in with creatinine of 15.49--- continue hemodialysis -Nephrology consultation placed with Dr. Thedore Mins  4. Acute severe hyperkalemia -Patient received IV bicarbonate, dextrose with insulin and calcium gluconate -Follow up potassium with dialysis -Came in with potassium of 7.5----3.5  5. Leukocytosis due to #2  6. DVT prophylaxis subcutaneous heparin  7. Possible seizures versus jerky/tremor movements and to uremic  encephalopathy -Empirically on IV Keppra -Appreciate neurology input. -EEG done and results  pending  8. Acute delirium/agitation with history of schizoaffective disorder -We'll get psychiatry consultation if needed   Case discussed with Care Management/Social Worker. Management plans discussed with the patient, family and they are in agreement.  CODE STATUS: full  DVT Prophylaxis: heparin  TOTAL TIME TAKING CARE OF THIS PATIENT: *30* minutes.  >50% time spent on counselling and coordination of care    Note: This dictation was prepared with Dragon dictation along with smaller phrase technology. Any transcriptional errors that result from this process are unintentional.  Shayan Bramhall M.D on 02/18/2017 at 11:48 AM  Between 7am to 6pm - Pager - [phone]  After 6pm go to [email or web address] - Social research officer, government  [facility]  Office  [phone]  CC: Primary care physician; Armando Gang, FNP

## 2017-02-18 NOTE — Progress Notes (Signed)
Patient extubated to room air.  Tolerated well HR 124 RR 18 Saturations 100%.

## 2017-02-18 NOTE — Progress Notes (Signed)
[facility], [place] 02/18/17  Subjective:  Patient remains critically ill at this point in time. He remains on the ventilator. He underwent hemodialysis yesterday. We plan for dialysis again tomorrow. Urine output was only 835 cc over the preceding 24 hours.   Objective:  Vital signs in last 24 hours:  Temp:  [98.6 F (37 C)-100 F (37.8 C)] 99.3 F (37.4 C) (07/17 0800) Pulse Rate:  [81-114] 107 (07/17 0900) Resp:  [14-26] 17 (07/17 0900) BP: (48-129)/(30-80) 98/48 (07/17 0900) SpO2:  [91 %-100 %] 97 % (07/17 0900) FiO2 (%):  [30 %] 30 % (07/17 0805) Weight:  [84.1 kg (185 lb 6.5 oz)] 84.1 kg (185 lb 6.5 oz) (07/17 0558)  Weight change: 2.453 kg (5 lb 6.5 oz) Filed Weights   02/16/17 1611 02/16/17 2100 02/18/17 0558  Weight: 81.6 kg (180 lb) 84 kg (185 lb 3 oz) 84.1 kg (185 lb 6.5 oz)    Intake/Output:    Intake/Output Summary (Last 24 hours) at 02/18/17 0955 Last data filed at 02/18/17 0737  Gross per 24 hour  Intake          1792.62 ml  Output              960 ml  Net           832.62 ml     Physical Exam: General: Critically ill appearing, laying in bed  HEENT ETT, OGT in place  Neck supple  Pulm/lungs Vent assisted, no crackles  CVS/Heart Regular; no rub  Abdomen:  Soft, distended  Extremities: No edema  Neurologic: Off sedation, awake  Skin: No acute rashes   Foley in place with yellow urine       Basic Metabolic Panel:   Recent Labs Lab 02/16/17 1615 02/16/17 1710 02/16/17 2140 02/16/17 2236 02/17/17 0500  NA 134* 133*  --   --  138  K UNABLE TO REPORT DUE TO HEMOLYSIS 7.5* 3.8 3.6 3.5  CL 102 102  --   --  97*  CO2 10* 11*  --   --  27  GLUCOSE 143* 137*  --   --  115*  BUN 140* 134*  --   --  71*  CREATININE 15.49* 15.14*  --   --  8.39*  CALCIUM 9.8 9.5  --   --  8.3*  MG  --   --   --   --  1.9  PHOS  --   --   --   --  6.8*     CBC:  Recent Labs Lab 02/16/17 1615 02/17/17 0500  WBC 17.6*  12.2*  NEUTROABS 16.7*  --   HGB 10.0* 8.0*  HCT 29.6* 23.2*  MCV 88.8 85.9  PLT 398 304     No results found for: HEPBSAG, HEPBSAB, HEPBIGM    Microbiology:  Recent Results (from the past 240 hour(s))  Urine Culture     Status: Abnormal (Preliminary result)   Collection Time: 02/16/17  4:15 PM  Result Value Ref Range Status   Specimen Description URINE, RANDOM  Final   Special Requests NONE  Final   Culture >=100,000 COLONIES/mL GRAM NEGATIVE RODS (A)  Final   Report Status PENDING  Incomplete  Culture, blood (Routine X 2) w Reflex to ID Panel     Status: None (Preliminary result)   Collection Time: 02/16/17  8:42 PM  Result Value Ref Range Status   Specimen Description BLOOD LEFT ARM  Final   Special Requests  Final    BOTTLES DRAWN AEROBIC AND ANAEROBIC Blood Culture adequate volume   Culture NO GROWTH 2 DAYS  Final   Report Status PENDING  Incomplete  Culture, blood (Routine X 2) w Reflex to ID Panel     Status: None (Preliminary result)   Collection Time: 02/16/17  8:52 PM  Result Value Ref Range Status   Specimen Description BLOOD LEFT HAND  Final   Special Requests   Final    BOTTLES DRAWN AEROBIC AND ANAEROBIC Blood Culture adequate volume   Culture NO GROWTH 2 DAYS  Final   Report Status PENDING  Incomplete  MRSA PCR Screening     Status: None   Collection Time: 02/16/17  9:00 PM  Result Value Ref Range Status   MRSA by PCR NEGATIVE NEGATIVE Final    Comment:        The GeneXpert MRSA Assay (FDA approved for NASAL specimens only), is one component of a comprehensive MRSA colonization surveillance program. It is not intended to diagnose MRSA infection nor to guide or monitor treatment for MRSA infections.     Coagulation Studies: No results for input(s): LABPROT, INR in the last 72 hours.  Urinalysis:  Recent Labs  02/16/17 1615  COLORURINE YELLOW*  LABSPEC 1.011  PHURINE 5.0  GLUCOSEU NEGATIVE  HGBUR MODERATE*  BILIRUBINUR NEGATIVE   KETONESUR NEGATIVE  PROTEINUR 100*  NITRITE NEGATIVE  LEUKOCYTESUR LARGE*      Imaging: Dg Abd 1 View  Result Date: 02/17/2017 CLINICAL DATA:  Initial evaluation for OG tube placement. EXAM: ABDOMEN - 1 VIEW COMPARISON:  None. FINDINGS: Enteric tube visualized at the upper abdomen, tip in side hole overlying the stomach. Tip projects distally. Visualized bowel gas pattern within normal limits without evidence for obstruction or ileus. No abnormal bowel wall thickening. No soft tissue mass or abnormal calcification. IMPRESSION: Tip in side hole of enteric tube overlying the stomach, beyond the GE junction. Tip projects distally. Electronically Signed   By: Jeannine Boga M.D.   On: 02/17/2017 19:23   Ct Head Wo Contrast  Result Date: 02/16/2017 CLINICAL DATA:  Fall. Hit head on night stand. Seizure. Persistent confusion. EXAM: CT HEAD WITHOUT CONTRAST CT MAXILLOFACIAL WITHOUT CONTRAST CT CERVICAL SPINE WITHOUT CONTRAST TECHNIQUE: Multidetector CT imaging of the head, cervical spine, and maxillofacial structures were performed using the standard protocol without intravenous contrast. Multiplanar CT image reconstructions of the cervical spine and maxillofacial structures were also generated. COMPARISON:  None. FINDINGS: CT HEAD FINDINGS Brain: Mild generalized atrophy is present. No acute infarct, hemorrhage, or mass lesion is present. The ventricles are of normal size. No significant extraaxial fluid collection is present. The brainstem and cerebellum are normal. The internal auditory canals are within normal limits. Vascular: No hyperdense vessel or unexpected calcification. Skull: Left periorbital soft tissue swelling is noted. There is no underlying fracture. The calvarium is intact. No significant extracranial soft tissue injury is present otherwise. CT MAXILLOFACIAL FINDINGS Osseous: No acute fracture is present. No focal lytic or blastic lesions are present. Orbits: Left periorbital soft  tissue swelling is present. The globes and orbits are within normal limits. No acute fracture is present. Sinuses: The paranasal sinuses and mastoid air cells are clear. Soft tissues: Soft tissues of the face are otherwise unremarkable. No additional soft tissue injury is evident. CT CERVICAL SPINE FINDINGS Alignment: AP alignment is anatomic. Mild rightward curvature is present. There is straightening and slight reversal of the normal cervical lordosis. Skull base and vertebrae: The skullbase is within  normal limits. Vertebral body heights are maintained. No acute fracture is present. Soft tissues and spinal canal: The soft tissues of the neck demonstrate bilateral enlargement of the thyroid. No discrete mass lesion is present. There is no significant adenopathy. No other focal soft tissue abnormality is present. Disc levels:  No significant focal stenosis is evident. Upper chest: The visualized lung apices are clear. IMPRESSION: 1. Left periorbital soft tissue swelling without underlying fracture. 2. No acute intracranial abnormality. 3. No other significant facial injury. 4. Straightening and slight reversal of the normal cervical lordosis without evidence for acute fracture or traumatic subluxation in the cervical spine. 5. Enlarged thyroid gland without discrete lesion. Electronically Signed   By: San Morelle M.D.   On: 02/16/2017 16:58   Ct Cervical Spine Wo Contrast  Result Date: 02/16/2017 CLINICAL DATA:  Fall. Hit head on night stand. Seizure. Persistent confusion. EXAM: CT HEAD WITHOUT CONTRAST CT MAXILLOFACIAL WITHOUT CONTRAST CT CERVICAL SPINE WITHOUT CONTRAST TECHNIQUE: Multidetector CT imaging of the head, cervical spine, and maxillofacial structures were performed using the standard protocol without intravenous contrast. Multiplanar CT image reconstructions of the cervical spine and maxillofacial structures were also generated. COMPARISON:  None. FINDINGS: CT HEAD FINDINGS Brain: Mild  generalized atrophy is present. No acute infarct, hemorrhage, or mass lesion is present. The ventricles are of normal size. No significant extraaxial fluid collection is present. The brainstem and cerebellum are normal. The internal auditory canals are within normal limits. Vascular: No hyperdense vessel or unexpected calcification. Skull: Left periorbital soft tissue swelling is noted. There is no underlying fracture. The calvarium is intact. No significant extracranial soft tissue injury is present otherwise. CT MAXILLOFACIAL FINDINGS Osseous: No acute fracture is present. No focal lytic or blastic lesions are present. Orbits: Left periorbital soft tissue swelling is present. The globes and orbits are within normal limits. No acute fracture is present. Sinuses: The paranasal sinuses and mastoid air cells are clear. Soft tissues: Soft tissues of the face are otherwise unremarkable. No additional soft tissue injury is evident. CT CERVICAL SPINE FINDINGS Alignment: AP alignment is anatomic. Mild rightward curvature is present. There is straightening and slight reversal of the normal cervical lordosis. Skull base and vertebrae: The skullbase is within normal limits. Vertebral body heights are maintained. No acute fracture is present. Soft tissues and spinal canal: The soft tissues of the neck demonstrate bilateral enlargement of the thyroid. No discrete mass lesion is present. There is no significant adenopathy. No other focal soft tissue abnormality is present. Disc levels:  No significant focal stenosis is evident. Upper chest: The visualized lung apices are clear. IMPRESSION: 1. Left periorbital soft tissue swelling without underlying fracture. 2. No acute intracranial abnormality. 3. No other significant facial injury. 4. Straightening and slight reversal of the normal cervical lordosis without evidence for acute fracture or traumatic subluxation in the cervical spine. 5. Enlarged thyroid gland without discrete  lesion. Electronically Signed   By: San Morelle M.D.   On: 02/16/2017 16:58   US Renal  Result Date: 02/17/2017 CLINICAL DATA:  Acute renal failure. EXAM: RENAL / URINARY TRACT ULTRASOUND COMPLETE COMPARISON:  01/24/2017 FINDINGS: The examination was limited as the patient was intubated and unable to follow breathing instructions. Right Kidney: Length: 9.0 cm. Increased parenchymal echogenicity. 1.4 cm hypoechoic upper pole lesion was better characterized as a simple cyst on the recent prior ultrasound. No hydronephrosis. Left Kidney: Length: 9.7 cm. Increased parenchymal echogenicity. 1.2 cm the hypoechoic interpolar lesion was better characterized as a simple  cyst on the recent prior ultrasound. No hydronephrosis. Bladder: Decompressed by Foley catheter. IMPRESSION: Echogenic kidneys compatible with medical renal disease. No hydronephrosis. Electronically Signed   By: Logan Bores M.D.   On: 02/17/2017 11:38   Portable Chest Xray  Result Date: 02/17/2017 CLINICAL DATA:  Respiratory failure. EXAM: PORTABLE CHEST 1 VIEW COMPARISON:  02/16/2017. FINDINGS: Endotracheal tube and NG tube in stable position. Heart size normal. Mild bibasilar subsegmental atelectasis. No pleural effusion or pneumothorax. IMPRESSION: 1. Lines and tubes in stable position. 2. Low lung volumes with mild bibasilar subsegmental atelectasis. Electronically Signed   By: Marcello Moores  Register   On: 02/17/2017 06:49   Dg Chest Portable 1 View  Result Date: 02/16/2017 CLINICAL DATA:  56 year old male status post intubation. EXAM: PORTABLE CHEST 1 VIEW COMPARISON:  Chest x-ray a 02/16/2017. FINDINGS: An endotracheal tube is in place with tip 2.8 cm above the carina. A nasogastric tube is seen extending into the stomach, however, the tip of the nasogastric tube extends below the lower margin of the image. Lung volumes are low. No consolidative airspace disease. No pleural effusions. No pneumothorax. No suspicious appearing pulmonary  nodules or masses. Heart size is normal. Upper mediastinal contours are within normal limits. IMPRESSION: 1. Support apparatus, as above. 2. Low lung volumes without radiographic evidence of acute cardiopulmonary disease. Electronically Signed   By: Vinnie Langton M.D.   On: 02/16/2017 19:29   Dg Chest Port 1 View  Result Date: 02/16/2017 CLINICAL DATA:  Recent seizure and fall, initial encounter EXAM: PORTABLE CHEST 1 VIEW COMPARISON:  None. FINDINGS: Cardiac shadow is within normal limits. The lungs are well aerated bilaterally. No acute bony abnormality is seen. IMPRESSION: No acute abnormality noted. Electronically Signed   By: Inez Catalina M.D.   On: 02/16/2017 17:11   Ct Maxillofacial Wo Contrast  Result Date: 02/16/2017 CLINICAL DATA:  Fall. Hit head on night stand. Seizure. Persistent confusion. EXAM: CT HEAD WITHOUT CONTRAST CT MAXILLOFACIAL WITHOUT CONTRAST CT CERVICAL SPINE WITHOUT CONTRAST TECHNIQUE: Multidetector CT imaging of the head, cervical spine, and maxillofacial structures were performed using the standard protocol without intravenous contrast. Multiplanar CT image reconstructions of the cervical spine and maxillofacial structures were also generated. COMPARISON:  None. FINDINGS: CT HEAD FINDINGS Brain: Mild generalized atrophy is present. No acute infarct, hemorrhage, or mass lesion is present. The ventricles are of normal size. No significant extraaxial fluid collection is present. The brainstem and cerebellum are normal. The internal auditory canals are within normal limits. Vascular: No hyperdense vessel or unexpected calcification. Skull: Left periorbital soft tissue swelling is noted. There is no underlying fracture. The calvarium is intact. No significant extracranial soft tissue injury is present otherwise. CT MAXILLOFACIAL FINDINGS Osseous: No acute fracture is present. No focal lytic or blastic lesions are present. Orbits: Left periorbital soft tissue swelling is present. The  globes and orbits are within normal limits. No acute fracture is present. Sinuses: The paranasal sinuses and mastoid air cells are clear. Soft tissues: Soft tissues of the face are otherwise unremarkable. No additional soft tissue injury is evident. CT CERVICAL SPINE FINDINGS Alignment: AP alignment is anatomic. Mild rightward curvature is present. There is straightening and slight reversal of the normal cervical lordosis. Skull base and vertebrae: The skullbase is within normal limits. Vertebral body heights are maintained. No acute fracture is present. Soft tissues and spinal canal: The soft tissues of the neck demonstrate bilateral enlargement of the thyroid. No discrete mass lesion is present. There is no significant adenopathy. No  other focal soft tissue abnormality is present. Disc levels:  No significant focal stenosis is evident. Upper chest: The visualized lung apices are clear. IMPRESSION: 1. Left periorbital soft tissue swelling without underlying fracture. 2. No acute intracranial abnormality. 3. No other significant facial injury. 4. Straightening and slight reversal of the normal cervical lordosis without evidence for acute fracture or traumatic subluxation in the cervical spine. 5. Enlarged thyroid gland without discrete lesion. Electronically Signed   By: San Morelle M.D.   On: 02/16/2017 16:58     Medications:   . cefTRIAXone (ROCEPHIN)  IV Stopped (02/17/17 1811)  . feeding supplement (VITAL AF 1.2 CAL) 1,000 mL (02/17/17 2047)  . fentaNYL infusion INTRAVENOUS 50 mcg/hr (02/17/17 1456)  . levETIRAcetam Stopped (02/18/17 0612)  . propofol (DIPRIVAN) infusion Stopped (02/16/17 2015)  . valproate sodium 500 mg (02/18/17 0737)   . bacitracin   Left Eye BID  . chlorhexidine gluconate (MEDLINE KIT)  15 mL Mouth Rinse BID  . fluPHENAZine  5 mg Oral QHS  . heparin  5,000 Units Subcutaneous Q8H  . mouth rinse  15 mL Mouth Rinse QID  . pantoprazole (PROTONIX) IV  40 mg Intravenous  Daily  . QUEtiapine  200 mg Per Tube BID     Assessment/ Plan:  56 y.o. AA male with COPD, HTN, Multinodular goiter, Schizoaffective disorder, CKD st 4, Primary hyperparathyroidism with hypercalcemia admitted for ARF  1. ARF on CKD st 4  Baseline Cr 3.3/GFR 23 from 01/27/2017 Admit Cr 15 ; likely secondary to underlying volume depletion, sepsis and severe ATN 2. Severe hyperkalemia 3. Severe Acidosis 4. UTI with sepsis 5. Acute resp failure - requiring mechanical ventilation 6. Recent Urinary retention  Plan: Patient seen and evaluated at bedside this a.m. He remains critically ill at this point in time.  We will plan for another session of dialysis tomorrow. Hopefully he can be weaned off the ventilator over the next several days.  For now his metabolic parameters have improved with dialysis. Overall prognosis remains guarded at this point in time.     LOS: 2 Carletha Dawn, [facility] 7/17/20189:55 AM  [address] [place], [place]

## 2017-02-18 NOTE — Progress Notes (Signed)
Pharmacy Antibiotic Note  Jason Diaz is a 56 y.o. male admitted on 02/16/2017 with acute on chronic kidney disease septic shock on ceftriaxone with urine culture growing GNR. Urine culture resulted with Citrobacter and Pseudomonas. Pharmacy has been consulted for cefepime dosing.  Plan: Cefepime 1 g iv q 24 hours. May change to cefepime 2 g iv q HD if patient transitions to a routine dialysis schedule.   Height: 5\' 10"  (177.8 cm) Weight: 185 lb 6.5 oz (84.1 kg) IBW/kg (Calculated) : 73  Temp (24hrs), Avg:99.2 F (37.3 C), Min:98.6 F (37 C), Max:100 F (37.8 C)   Recent Labs Lab 02/16/17 1614 02/16/17 1615 02/16/17 1710 02/16/17 2051 02/17/17 0500  WBC  --  17.6*  --   --  12.2*  CREATININE  --  15.49* 15.14*  --  8.39*  LATICACIDVEN 2.3*  --   --  0.7  --     Estimated Creatinine Clearance: 10.5 mL/min (A) (by C-G formula based on SCr of 8.39 mg/dL (H)).    No Known Allergies  Antimicrobials this admission: 07/15 Zosyn x 1 07/15 vancomycin x 1 07/15 Cefepime x 1, 07/17 >> 07/16 ceftriaxone >> 7/17   Dose adjustments this admission:  Microbiology results: 7/15 BCx: NGTD UCx: Citrobacter koseri and Pseudomonas aeruginosa MRSA PCR: negative  Thank you for allowing pharmacy to be a part of this patient's care.  Valentina Guhristy, [place] Sloop D, PharmD, BCPS Clinical Pharmacist 02/18/2017 3:50 PM

## 2017-02-19 ENCOUNTER — Inpatient Hospital Stay: Payer: Medicare Other

## 2017-02-19 DIAGNOSIS — R410 Disorientation, unspecified: Secondary | ICD-10-CM

## 2017-02-19 LAB — BASIC METABOLIC PANEL
ANION GAP: 17 — AB (ref 5–15)
BUN: 59 mg/dL — ABNORMAL HIGH (ref 6–20)
CO2: 24 mmol/L (ref 22–32)
Calcium: 10.2 mg/dL (ref 8.9–10.3)
Chloride: 103 mmol/L (ref 101–111)
Creatinine, Ser: 7.27 mg/dL — ABNORMAL HIGH (ref 0.61–1.24)
GFR, EST AFRICAN AMERICAN: 9 mL/min — AB (ref >60)
GFR, EST NON AFRICAN AMERICAN: 8 mL/min — AB (ref >60)
GLUCOSE: 71 mg/dL (ref 65–99)
POTASSIUM: 3.2 mmol/L — AB (ref 3.5–5.1)
SODIUM: 144 mmol/L (ref 135–145)

## 2017-02-19 LAB — URINE CULTURE

## 2017-02-19 LAB — VALPROIC ACID LEVEL: VALPROIC ACID LVL: 40 ug/mL — AB (ref 50.0–100.0)

## 2017-02-19 MED ORDER — AMLODIPINE BESYLATE 5 MG PO TABS
5.0000 mg | ORAL_TABLET | Freq: Every day | ORAL | Status: DC
  Administered 2017-02-20: 5 mg via ORAL
  Filled 2017-02-19: qty 1

## 2017-02-19 MED ORDER — BUDESONIDE 0.25 MG/2ML IN SUSP: 0.2500 mg | Freq: Two times a day (BID) | RESPIRATORY_TRACT | Status: DC | PRN

## 2017-02-19 MED ORDER — VALPROATE SODIUM 500 MG/5ML IV SOLN
1500.0000 mg | Freq: Two times a day (BID) | INTRAVENOUS | Status: DC
  Administered 2017-02-19 – 2017-02-22 (×6): 1500 mg via INTRAVENOUS
  Filled 2017-02-19 (×8): qty 15

## 2017-02-19 MED ORDER — PANTOPRAZOLE SODIUM 40 MG PO TBEC
40.0000 mg | DELAYED_RELEASE_TABLET | Freq: Every day | ORAL | Status: DC
  Administered 2017-02-20 – 2017-02-24 (×4): 40 mg via ORAL
  Filled 2017-02-19 (×5): qty 1

## 2017-02-19 MED ORDER — METOPROLOL TARTRATE 5 MG/5ML IV SOLN
5.0000 mg | Freq: Four times a day (QID) | INTRAVENOUS | Status: DC
  Administered 2017-02-19 (×3): 5 mg via INTRAVENOUS
  Filled 2017-02-19 (×3): qty 5

## 2017-02-19 MED ORDER — IPRATROPIUM-ALBUTEROL 0.5-2.5 (3) MG/3ML IN SOLN: 3.0000 mL | RESPIRATORY_TRACT | Status: DC | PRN

## 2017-02-19 MED ORDER — METOPROLOL TARTRATE 5 MG/5ML IV SOLN
2.5000 mg | INTRAVENOUS | Status: DC | PRN
Start: 2017-02-19 — End: 2017-02-27
  Administered 2017-02-19: 5 mg via INTRAVENOUS
  Administered 2017-02-19: 2.5 mg via INTRAVENOUS
  Administered 2017-02-19: 5 mg via INTRAVENOUS
  Administered 2017-02-20: 2.5 mg via INTRAVENOUS
  Filled 2017-02-19 (×5): qty 5

## 2017-02-19 MED ORDER — HYDRALAZINE HCL 20 MG/ML IJ SOLN: 10.0000 mg | INTRAMUSCULAR | Status: DC | PRN

## 2017-02-19 MED ORDER — HYDRALAZINE HCL 20 MG/ML IJ SOLN
10.0000 mg | INTRAMUSCULAR | Status: DC | PRN
  Administered 2017-02-19: 10 mg via INTRAVENOUS
  Filled 2017-02-19: qty 1

## 2017-02-19 NOTE — Evaluation (Signed)
Clinical/Bedside Swallow Evaluation Patient Details  Name: Jason Diaz MRN: [id number] Date of Birth: 11-Jul-1961  Today's Date: 02/19/2017 Time: SLP Start Time (ACUTE ONLY): 1100 SLP Stop Time (ACUTE ONLY): 1200 SLP Time Calculation (min) (ACUTE ONLY): 60 min  Past Medical History:  Past Medical History:  Diagnosis Date  . CKD (chronic kidney disease), stage IV (HCC)   . COPD (chronic obstructive pulmonary disease) (HCC)   . GERD (gastroesophageal reflux disease)   . Hypertension   . Multinodular goiter   . Schizoaffective disorder [facility](HCC)    Past Surgical History:  Past Surgical History:  Procedure Laterality Date  . TONSILLECTOMY    . TOOTH EXTRACTION     HPI:  Pt  is a 56 y.o. male with a known history of COPD not on oxygen, hypertension, multinodular goiter, schizoaffective disorder, CK D stage IV, GERD who lives in a group home was sent to the hospital referred by his nephrologist. acute renal failure-on CKD stage IV versus progression of CKD. Pt is nonverbal and not following any commands consistently; confusion and poor engagement.    Assessment / Plan / Recommendation Clinical Impression  Pt appears at increased risk for aspiration secondary to his declined Cognitive status impacting his overall engagement and follow through w/ tasks. After min-mod verbal/tactile/visual cues, pt consumed po trials of Nectar consistency liquids and Purees w/ no immediate, overt s/s of aspiration noted; no decline in respiratory status or O2 sats per monitor(98%) when checked. Oral phase appeared wfl for bolus management for A-P transfer then oral clearing; no residue or anterior spillage occurred. Pt exhibited slow motor responses initially w/ taking po's but then increased responsiveness as trials continued. Pt continued to exhibit the same blowing vocal noises as he did at rest w/ lingual protrusion intermittently - this was not noted during oral intake. Suspect this behavior is related to his  declined Cognitive status. Pt required full feeding assistance. No other trial consistencies were assessed at this evaluation d/t pt's Cognitive decline and reduced engagement. Recommend a modified diet at this time w/ trials to upgrade as appropriate.  SLP Visit Diagnosis: Dysphagia, oropharyngeal phase (R13.12)    Aspiration Risk  Mild aspiration risk;Moderate aspiration risk    Diet Recommendation  Dysphagia level 1 (puree) w/ Nectar consistency liquids; aspiration precautions; reflux precautions  Medication Administration: Crushed with puree    Other  Recommendations Recommended Consults:  (Dietician f/u) Oral Care Recommendations: Oral care BID;Staff/trained caregiver to provide oral care Other Recommendations: Order thickener from pharmacy;Prohibited food (jello, ice cream, thin soups);Remove water pitcher;Have oral suction available   Follow up Recommendations Skilled Nursing facility (TBD)      Frequency and Duration min 3x week  2 weeks       Prognosis Prognosis for Safe Diet Advancement: Fair Barriers to Reach Goals: Cognitive deficits;Behavior      Swallow Study   General Date of Onset: 02/16/17 HPI: Pt  is a 56 y.o. male with a known history of COPD not on oxygen, hypertension, multinodular goiter, schizoaffective disorder, CK D stage IV, GERD who lives in a group home was sent to the hospital referred by his nephrologist. acute renal failure-on CKD stage IV versus progression of CKD. Pt is nonverbal and not following any commands consistently; confusion and poor engagement.  Type of Study: Bedside Swallow Evaluation Previous Swallow Assessment: unknown Diet Prior to this Study: NPO Temperature Spikes Noted: No (wbc 12.2) Respiratory Status: Room air History of Recent Intubation: No Behavior/Cognition: Cooperative;Confused;Requires cueing;Doesn't follow directions (awake; nonverbal;  slower to respond motor-wise in all) Oral Cavity Assessment: Within Functional Limits  (brief assessment) Oral Care Completed by SLP: Recent completion by staff Oral Cavity - Dentition: Edentulous Vision:  (n/a) Self-Feeding Abilities: Total assist Patient Positioning: Postural control adequate for testing (sliding down even when propped; turning) Baseline Vocal Quality:  (nonverbal) Volitional Cough: Cognitively unable to elicit Volitional Swallow: Unable to elicit    Oral/Motor/Sensory Function Overall Oral Motor/Sensory Function: Within functional limits (w/ bolus management)   Ice Chips Ice chips: Not tested   Thin Liquid Thin Liquid: Not tested    Nectar Thick Nectar Thick Liquid: Within functional limits Presentation: Spoon;Straw (~3 ozs total) Other Comments: pt exhibited slow motor responses initially to taking po's but then increased responsiveness as trials continued   Honey Thick Honey Thick Liquid: Not tested   Puree Puree: Within functional limits Presentation: Spoon (fed; ~2-3 ozs) Other Comments: similar results as w/ Nectar consistency liquids   Solid   GO   Solid: Not tested         Jerilynn Som, MS, CCC-SLP Watson,Katherine 02/19/2017,1:27 PM

## 2017-02-19 NOTE — Plan of Care (Signed)
Problem: SLP Dysphagia Goals Goal: Misc Dysphagia Goal Pt will safely tolerate po diet of least restrictive consistency w/ no overt s/s of aspiration noted by Staff/pt/family x3 sessions.    

## 2017-02-19 NOTE — NC FL2 (Signed)
[place] LEVEL OF CARE SCREENING TOOL     IDENTIFICATION  Patient Name: Jason Diaz Birthdate: Feb 26, 1961 Sex: male Admission Date (Current Location): 02/16/2017  [place] and [place] Number:  Engineering geologist and Address:  [facility], [address], [place], [place] [address]      Provider Number: [id number]  Attending Physician Name and Address:  Fritzi Mandes, MD  Relative Name and Phone Number:       Current Level of Care: Hospital Recommended Level of Care: [place] Prior Approval Number:    Date Approved/Denied:   PASRR Number: [id number]  (expires 02/27/17)  Discharge Plan: SNF    Current Diagnoses: Patient Active Problem List   Diagnosis Date Noted  . Acute respiratory failure ([place])   . Seizure-like activity ([place])   . Sepsis ([place])   . Septic shock ([place]) 02/16/2017  . Schizoaffective disorder ([place]) 02/16/2017  . Hypercalcemia 01/27/2017  . Hematuria 01/27/2017  . Acute urinary retention 01/27/2017  . Hyponatremia 01/27/2017  . Hypokalemia 01/27/2017  . ARF (acute renal failure) ([place]) 01/23/2017    Orientation RESPIRATION BLADDER Height & Weight     Self  O2 (2L) Continent, Indwelling catheter (chronic) Weight: 185 lb 6.5 oz (84.1 kg) Height:  _0  (177.8 cm)  BEHAVIORAL SYMPTOMS/MOOD NEUROLOGICAL BOWEL NUTRITION STATUS      Continent Diet (See DC summary)  AMBULATORY STATUS COMMUNICATION OF NEEDS Skin   Extensive Assist Verbally Normal                       Personal Care Assistance Level of Assistance  Bathing, Feeding, Dressing Bathing Assistance: Limited assistance Feeding assistance: Limited assistance Dressing Assistance: Limited assistance     Functional Limitations Info  Sight, Hearing, Speech Sight Info: Adequate Hearing Info: Adequate Speech Info: Adequate    SPECIAL CARE FACTORS FREQUENCY  PT (By licensed PT), OT (By licensed OT)     PT Frequency: 5x OT  Frequency: 5x            Contractures Contractures Info: Not present    Additional Factors Info  Code Status, Allergies Code Status Info: Full Code Allergies Info: NKA Psychotropic Info: See med list         Current Medications (02/19/2017):  This is the current hospital active medication list Current Facility-Administered Medications  Medication Dose Route Frequency Provider Last Rate Last Dose  . acetaminophen (TYLENOL) tablet 650 mg  650 mg Oral Q6H PRN Fritzi Mandes, MD       Or  . acetaminophen (TYLENOL) suppository 650 mg  650 mg Rectal Q6H PRN Fritzi Mandes, MD      . bacitracin ophthalmic ointment   Left Eye BID Dorene Sorrow S, NP      . bisacodyl (DULCOLAX) suppository 10 mg  10 mg Rectal Daily PRN Dorene Sorrow S, NP      . budesonide (PULMICORT) nebulizer solution 0.25 mg  0.25 mg Nebulization Q6H Wilhelmina Mcardle, MD   0.25 mg at 02/18/17 1950  . ceFEPIme (MAXIPIME) 1 g in dextrose 5 % 50 mL IVPB  1 g Intravenous q1800 Napoleon Form, [facility]   Stopped at 02/18/17 1714  . chlorhexidine gluconate (MEDLINE KIT) (PERIDEX) 0.12 % solution 15 mL  15 mL Mouth Rinse BID Dorene Sorrow S, NP   15 mL at 02/19/17 0920  . fluPHENAZine (PROLIXIN) tablet 5 mg  5 mg Oral QHS Flora Lipps, MD   Stopped at 02/18/17 2141  .  heparin injection 5,000 Units  5,000 Units Subcutaneous Q8H Fritzi Mandes, MD   5,000 Units at 02/19/17 0545  . hydrALAZINE (APRESOLINE) injection 10-20 mg  10-20 mg Intravenous Q4H PRN Wilhelmina Mcardle, MD      . ipratropium-albuterol (DUONEB) 0.5-2.5 (3) MG/3ML nebulizer solution 3 mL  3 mL Nebulization Q6H Wilhelmina Mcardle, MD   3 mL at 02/18/17 1950  . MEDLINE mouth rinse  15 mL Mouth Rinse QID Dorene Sorrow S, NP   15 mL at 02/19/17 0555  . metoprolol tartrate (LOPRESSOR) injection 2.5-5 mg  2.5-5 mg Intravenous Q3H PRN Wilhelmina Mcardle, MD   5 mg at 02/19/17 7356  . metoprolol tartrate (LOPRESSOR) injection 5 mg  5 mg Intravenous Q6H Wilhelmina Mcardle, MD       . QUEtiapine (SEROQUEL) tablet 200 mg  200 mg Oral BID Wilhelmina Mcardle, MD   Stopped at 02/18/17 2141  . sennosides (SENOKOT) 8.8 MG/5ML syrup 5 mL  5 mL Per Tube BID PRN Tukov, Magadalene S, NP      . sodium chloride flush (NS) 0.9 % injection 10-40 mL  10-40 mL Intracatheter PRN Fritzi Mandes, MD      . valproate (DEPACON) 1,000 mg in dextrose 5 % 50 mL IVPB  1,000 mg Intravenous Q12H Alexis Goodell, MD 60 mL/hr at 02/19/17 0919 1,000 mg at 02/19/17 0919     Discharge Medications: Please see discharge summary for a list of discharge medications.  Relevant Imaging Results:  Relevant Lab Results:   Additional Information SSN [id number]     [place], LCSW

## 2017-02-19 NOTE — Progress Notes (Signed)
Patient resting intermittanly overnight, patient follows simple commands but is still impulsive, mitts are in place patient remains on RA maintaining saturation WDL.  See CHL for further assessment.

## 2017-02-19 NOTE — Progress Notes (Signed)
[place], [place] 02/19/17  Subjective:  Patient was extubated yesterday. Urine output did increase to 1.6 L over the preceding 24 hours. BUN currently 59 with a creatinine of 7.27. This has trended down a bit.   Objective:  Vital signs in last 24 hours:  Temp:  [97.3 F (36.3 C)-99 F (37.2 C)] 98.1 F (36.7 C) (07/18 0800) Pulse Rate:  [94-131] 131 (07/18 0800) Resp:  [14-30] 30 (07/18 0800) BP: (89-163)/(54-114) 163/88 (07/18 0800) SpO2:  [85 %-100 %] 100 % (07/18 0800)  Weight change:  Filed Weights   02/16/17 1611 02/16/17 2100 02/18/17 0558  Weight: 81.6 kg (180 lb) 84 kg (185 lb 3 oz) 84.1 kg (185 lb 6.5 oz)    Intake/Output:    Intake/Output Summary (Last 24 hours) at 02/19/17 0936 Last data filed at 02/19/17 0600  Gross per 24 hour  Intake           668.33 ml  Output             1550 ml  Net          -881.67 ml     Physical Exam: General: No acute distress   HEENT Normocephalic/atraumatic, old mucosal moist   Neck supple  Pulm/lungs Scattered rhonchi, normal respiratory effort   CVS/Heart Regular; no rub  Abdomen:  Soft, distended, Bowel sounds present   Extremities: No edema  Neurologic: Awake, alert, will follow simple commands   Skin: No acute rashes   Foley in place with yellow urine       Basic Metabolic Panel:   Recent Labs Lab 02/16/17 1615 02/16/17 1710 02/16/17 2140 02/16/17 2236 02/17/17 0500 02/19/17 0857  NA 134* 133*  --   --  138 144  K UNABLE TO REPORT DUE TO HEMOLYSIS 7.5* 3.8 3.6 3.5 3.2*  CL 102 102  --   --  97* 103  CO2 10* 11*  --   --  27 24  GLUCOSE 143* 137*  --   --  115* 71  BUN 140* 134*  --   --  71* 59*  CREATININE 15.49* 15.14*  --   --  8.39* 7.27*  CALCIUM 9.8 9.5  --   --  8.3* 10.2  MG  --   --   --   --  1.9  --   PHOS  --   --   --   --  6.8*  --      CBC:  Recent Labs Lab 02/16/17 1615 02/17/17 0500  WBC 17.6* 12.2*  NEUTROABS 16.7*  --   HGB 10.0* 8.0*   HCT 29.6* 23.2*  MCV 88.8 85.9  PLT 398 304      Lab Results  Component Value Date   HEPBSAG Negative 02/16/2017   HEPBSAB Non Reactive 02/16/2017      Microbiology:  Recent Results (from the past 240 hour(s))  Urine Culture     Status: Abnormal   Collection Time: 02/16/17  4:15 PM  Result Value Ref Range Status   Specimen Description URINE, RANDOM  Final   Special Requests NONE  Final   Culture (A)  Final    >=100,000 COLONIES/mL CITROBACTER KOSERI 50,000 COLONIES/mL PSEUDOMONAS AERUGINOSA    Report Status 02/19/2017 FINAL  Final   Organism ID, Bacteria CITROBACTER KOSERI (A)  Final   Organism ID, Bacteria PSEUDOMONAS AERUGINOSA (A)  Final      Susceptibility   Citrobacter koseri - MIC*    CEFAZOLIN <=4 SENSITIVE  Sensitive     CEFTRIAXONE <=1 SENSITIVE Sensitive     CIPROFLOXACIN <=0.25 SENSITIVE Sensitive     GENTAMICIN <=1 SENSITIVE Sensitive     IMIPENEM <=0.25 SENSITIVE Sensitive     NITROFURANTOIN 32 SENSITIVE Sensitive     TRIMETH/SULFA <=20 SENSITIVE Sensitive     PIP/TAZO <=4 SENSITIVE Sensitive     * >=100,000 COLONIES/mL CITROBACTER KOSERI   Pseudomonas aeruginosa - MIC*    CEFTAZIDIME 4 SENSITIVE Sensitive     CIPROFLOXACIN <=0.25 SENSITIVE Sensitive     GENTAMICIN <=1 SENSITIVE Sensitive     IMIPENEM 1 SENSITIVE Sensitive     PIP/TAZO 8 SENSITIVE Sensitive     CEFEPIME 2 SENSITIVE Sensitive     * 50,000 COLONIES/mL PSEUDOMONAS AERUGINOSA  Culture, blood (Routine X 2) w Reflex to ID Panel     Status: None (Preliminary result)   Collection Time: 02/16/17  8:42 PM  Result Value Ref Range Status   Specimen Description BLOOD LEFT ARM  Final   Special Requests   Final    BOTTLES DRAWN AEROBIC AND ANAEROBIC Blood Culture adequate volume   Culture NO GROWTH 3 DAYS  Final   Report Status PENDING  Incomplete  Culture, blood (Routine X 2) w Reflex to ID Panel     Status: None (Preliminary result)   Collection Time: 02/16/17  8:52 PM  Result Value Ref  Range Status   Specimen Description BLOOD LEFT HAND  Final   Special Requests   Final    BOTTLES DRAWN AEROBIC AND ANAEROBIC Blood Culture adequate volume   Culture NO GROWTH 3 DAYS  Final   Report Status PENDING  Incomplete  MRSA PCR Screening     Status: None   Collection Time: 02/16/17  9:00 PM  Result Value Ref Range Status   MRSA by PCR NEGATIVE NEGATIVE Final    Comment:        The GeneXpert MRSA Assay (FDA approved for NASAL specimens only), is one component of a comprehensive MRSA colonization surveillance program. It is not intended to diagnose MRSA infection nor to guide or monitor treatment for MRSA infections.     Coagulation Studies: No results for input(s): LABPROT, INR in the last 72 hours.  Urinalysis:  Recent Labs  02/16/17 1615  COLORURINE YELLOW*  LABSPEC 1.011  PHURINE 5.0  GLUCOSEU NEGATIVE  HGBUR MODERATE*  BILIRUBINUR NEGATIVE  KETONESUR NEGATIVE  PROTEINUR 100*  NITRITE NEGATIVE  LEUKOCYTESUR LARGE*      Imaging: Dg Abd 1 View  Result Date: 02/17/2017 CLINICAL DATA:  Initial evaluation for OG tube placement. EXAM: ABDOMEN - 1 VIEW COMPARISON:  None. FINDINGS: Enteric tube visualized at the upper abdomen, tip in side hole overlying the stomach. Tip projects distally. Visualized bowel gas pattern within normal limits without evidence for obstruction or ileus. No abnormal bowel wall thickening. No soft tissue mass or abnormal calcification. IMPRESSION: Tip in side hole of enteric tube overlying the stomach, beyond the GE junction. Tip projects distally. Electronically Signed   By: Jeannine Boga M.D.   On: 02/17/2017 19:23   US Renal  Result Date: 02/17/2017 CLINICAL DATA:  Acute renal failure. EXAM: RENAL / URINARY TRACT ULTRASOUND COMPLETE COMPARISON:  01/24/2017 FINDINGS: The examination was limited as the patient was intubated and unable to follow breathing instructions. Right Kidney: Length: 9.0 cm. Increased parenchymal echogenicity.  1.4 cm hypoechoic upper pole lesion was better characterized as a simple cyst on the recent prior ultrasound. No hydronephrosis. Left Kidney: Length: 9.7 cm. Increased parenchymal  echogenicity. 1.2 cm the hypoechoic interpolar lesion was better characterized as a simple cyst on the recent prior ultrasound. No hydronephrosis. Bladder: Decompressed by Foley catheter. IMPRESSION: Echogenic kidneys compatible with medical renal disease. No hydronephrosis. Electronically Signed   By: Logan Bores M.D.   On: 02/17/2017 11:38     Medications:   . ceFEPime (MAXIPIME) IV Stopped (02/18/17 1714)  . valproate sodium 1,000 mg (02/19/17 0919)   . bacitracin   Left Eye BID  . budesonide (PULMICORT) nebulizer solution  0.25 mg Nebulization Q6H  . chlorhexidine gluconate (MEDLINE KIT)  15 mL Mouth Rinse BID  . fluPHENAZine  5 mg Oral QHS  . heparin  5,000 Units Subcutaneous Q8H  . ipratropium-albuterol  3 mL Nebulization Q6H  . mouth rinse  15 mL Mouth Rinse QID  . metoprolol tartrate  5 mg Intravenous Q6H  . QUEtiapine  200 mg Oral BID     Assessment/ Plan:  56 y.o. AA male with COPD, HTN, Multinodular goiter, Schizoaffective disorder, CKD st 4, Primary hyperparathyroidism with hypercalcemia admitted for ARF  1. ARF on CKD st 4  Baseline Cr 3.3/GFR 23 from 01/27/2017 Admit Cr 15 ; likely secondary to underlying volume depletion, sepsis and severe ATN 2.  Hyperkalemia, improved 3. Acidosis, improved 4. UTI with sepsis 5. Acute resp failure - extubated 02/18/17 6. Recent Urinary retention 7. Anemia of CKD.  Plan: Urine output has improved and currently urine output has been 1.6 L over the preceding 24 hours. Creatinine currently 7.27. We will hold off on dialysis at this moment but reassess him for dialysis services tomorrow. Okay to be transitioned to floor care at the moment. Hemoglobin currently 8.0. We may need to consider adding Epogen to his medication regimen. Overall he remains quite ill but  we are hopeful that he may begin to improve.     LOS: 3 Jason Diaz 7/18/20189:36 AM  [facility] [place], [place]

## 2017-02-19 NOTE — Progress Notes (Addendum)
Tolerating extubation. Following some commands.  Vitals:   02/19/17 0400 02/19/17 0600 02/19/17 0700 02/19/17 0800  BP: (!) 147/88 (!) 136/114  (!) 163/88  Pulse: (!) 118 (!) 119 (!) 130 (!) 131  Resp: 20 (!) 21 (!) 22 (!) 30  Temp: (!) 97.5 F (36.4 C)   98.1 F (36.7 C)  TempSrc: Oral   Oral  SpO2: 100% 96% 100% 100%  Weight:      Height:        NAD HEENT WNL No wheezes Regular, no murmurs NABS, NT, soft Extremities warm, no edema No focal neurologic deficits   BMP Latest Ref Rng & Units 02/19/2017 02/17/2017 02/16/2017  Glucose 65 - 99 mg/dL 71 161(W115(H) -  BUN 6 - 20 mg/dL 96(E59(H) 45(W71(H) -  Creatinine 0.61 - 1.24 mg/dL 0.98(J7.27(H) 1.91(Y8.39(H) -  Sodium 135 - 145 mmol/L 144 138 -  Potassium 3.5 - 5.1 mmol/L 3.2(L) 3.5 3.6  Chloride 101 - 111 mmol/L 103 97(L) -  CO2 22 - 32 mmol/L 24 27 -  Calcium 8.9 - 10.3 mg/dL 78.210.2 8.3(L) -   CBC Latest Ref Rng & Units 02/17/2017 02/16/2017 01/27/2017  WBC 3.8 - 10.6 K/uL 12.2(H) 17.6(H) -  Hemoglobin 13.0 - 18.0 g/dL 8.0(L) 10.0(L) 13.2  Hematocrit 40.0 - 52.0 % 23.2(L) 29.6(L) -  Platelets 150 - 440 K/uL 304 398 -   CXR: No new film  IMPRESSION: Acute respiratory failure, resolved Wheezing, resolved Acute seizure, none further AKI/CKD, oliguric - S/P HD 07/16  Urine output excellent, creatinine improved Hyperkalemia, resolved Elevated PCT, suspected UTI Extensive psychiatric history  PLAN/REC: Continue nebulized steroids and BDs Transfer to general medical floor Continue anticonvulsant therapy per neurology Management of renal failure per nephrology Complete course of antibiotics for urinary tract infection (Cefepime) Continue neuroleptic therapy SLP eval today  After transfer, PCCM will sign off. Please call if we can be of further assistance   Billy Fischeravid Simonds, MD PCCM service Mobile [phone] Pager [phone] 02/19/2017 2:18 PM

## 2017-02-19 NOTE — Progress Notes (Signed)
02/19/2017 16:30  Pt heart rate over 130 on telemetry.  Notified attending MD Allena KatzPatel, who recommended give IV metoprolol scheduled for 1800 at current time.  Attempted to givem, but pt's peripheral IV access was no longer usable.  Ordered IV team consult to restore IV access and give medicine. .  Will continue to monitor, and will administer med as soon as possible. Bradly Chrisougherty, Tifanie Gardiner E, RN

## 2017-02-19 NOTE — Progress Notes (Signed)
Pharmacy Antibiotic Note  Jason Diaz is a 56 y.o. male admitted on 02/16/2017 with acute on chronic kidney disease septic shock on ceftriaxone with urine culture growing GNR. Urine culture resulted with Citrobacter and Pseudomonas. Pharmacy has been consulted for cefepime dosing.  Plan: Cefepime 1 g iv q 24 hours. May change to cefepime 2 g iv q HD if patient transitions to a routine dialysis schedule.   Height: 5\' 10"  (177.8 cm) Weight: 185 lb 6.5 oz (84.1 kg) IBW/kg (Calculated) : 73  Temp (24hrs), Avg:97.9 F (36.6 C), Min:97.3 F (36.3 C), Max:98.7 F (37.1 C)   Recent Labs Lab 02/16/17 1614 02/16/17 1615 02/16/17 1710 02/16/17 2051 02/17/17 0500 02/19/17 0857  WBC  --  17.6*  --   --  12.2*  --   CREATININE  --  15.49* 15.14*  --  8.39* 7.27*  LATICACIDVEN 2.3*  --   --  0.7  --   --     Estimated Creatinine Clearance: 12.1 mL/min (A) (by C-G formula based on SCr of 7.27 mg/dL (H)).    No Known Allergies  Antimicrobials this admission: 07/15 Zosyn x 1 07/15 vancomycin x 1 07/15 Cefepime x 1, 07/17 >> 07/16 ceftriaxone >> 7/17   Dose adjustments this admission:  Microbiology results: 7/15 BCx: NGTD UCx: Citrobacter koseri and Pseudomonas aeruginosa MRSA PCR: negative  Thank you for allowing pharmacy to be a part of this patient's care.  Simpson,Devontaye L, PharmD, BCPS Clinical Pharmacist 02/19/2017 2:42 PM

## 2017-02-19 NOTE — Consult Note (Signed)
[facility] Face-to-Face Psychiatry Consult   Reason for Consult:  Consult for 56 year old man with a history of schizophrenia who was brought into the hospital after a fall and injury and then required intubation. Referring Physician:  Posey Pronto Patient Identification: Jason Diaz MRN:  [id number] Principal Diagnosis: Acute delirium Diagnosis:   Patient Active Problem List   Diagnosis Date Noted  . Acute delirium [R41.0] 02/19/2017  . Acute respiratory failure ([place]) [J96.00]   . Seizure-like activity ([place]) [R56.9]   . Sepsis ([place]) [A41.9]   . Septic shock ([place]) [A41.9, R65.21] 02/16/2017  . Schizoaffective disorder ([place]) [F25.9] 02/16/2017  . Hypercalcemia [E83.52] 01/27/2017  . Hematuria [R31.9] 01/27/2017  . Acute urinary retention [R33.8] 01/27/2017  . Hyponatremia [E87.1] 01/27/2017  . Hypokalemia [E87.6] 01/27/2017  . ARF (acute renal failure) ([place]) [N17.9] 01/23/2017    Total Time spent with patient: 1 hour  Subjective:   Jason Diaz is a 56 y.o. male patient admitted with patient is not able to communicate.  HPI:  Attempted to interview patient. Reviewed old chart. This is a 56 year old man who carries a diagnosis of schizophrenia or schizoaffective disorder and who lives in a group home. He was brought to the hospital after reportedly having a fall and hitting his head. He was thought to have some seizure-like activity but was also found to be an acute worsening renal failure. Patient was intubated and was in the ICU briefly and has now been extubated and moved to the floor. Patient on evaluation had his eyes open and appeared to be awake but was not able to verbally interact. He was able to answer yes to a couple of questions but was not able to say anything else and was not able to follow direct commands or say his name. Affect flat and somewhat confused.  Social history: According to the chart he lives in a group home or assisted living type setting. He reports that they're his  family at times that might be involved but the closest communication seems to be with the manager of the facility.  Medical history: Chronic renal failure which has worsened in the hospital. Possible new seizure activity.  Substance abuse history: No information  Past Psychiatric History: We don't have any previous psychiatric information other than the list of medicines that he was on. It looks like he was on quite a bit of psychiatric medicine including 3 antipsychotics between the Seroquel risperdal and Prolixin. We don't have any record of prior hospitalizations or symptoms at baseline.  Risk to Self: Is patient at risk for suicide?: No Risk to Others:   Prior Inpatient Therapy:   Prior Outpatient Therapy:    Past Medical History:  Past Medical History:  Diagnosis Date  . CKD (chronic kidney disease), stage IV ([place])   . COPD (chronic obstructive pulmonary disease) ([place])   . GERD (gastroesophageal reflux disease)   . Hypertension   . Multinodular goiter   . Schizoaffective disorder [facility])     Past Surgical History:  Procedure Laterality Date  . TONSILLECTOMY    . TOOTH EXTRACTION     Family History:  Family History  Problem Relation Age of Onset  . Kidney disease Father   . Diabetes Father    Family Psychiatric  History: Unknown Social History:  History  Alcohol Use No     History  Drug Use No    Social History   Social History  . Marital status: Single    Spouse name: N/A  . Number  of children: N/A  . Years of education: N/A   Social History Main Topics  . Smoking status: Current Every Day Smoker    Packs/day: 0.25    Types: Cigarettes  . Smokeless tobacco: Never Used  . Alcohol use No  . Drug use: No  . Sexual activity: Not Asked   Other Topics Concern  . None   Social History Narrative   From [facility] group home   Additional Social History:    Allergies:  No Known Allergies  Labs:  Results for orders placed or performed during the  hospital encounter of 02/16/17 (from the past 48 hour(s))  Glucose, capillary     Status: None   Collection Time: 02/17/17  8:44 PM  Result Value Ref Range   Glucose-Capillary 77 65 - 99 mg/dL  Glucose, capillary     Status: Abnormal   Collection Time: 02/18/17 12:06 AM  Result Value Ref Range   Glucose-Capillary 102 (H) 65 - 99 mg/dL  Procalcitonin     Status: None   Collection Time: 02/18/17  3:59 AM  Result Value Ref Range   Procalcitonin 12.87 ng/mL    Comment:        Interpretation: PCT >= 10 ng/mL: Important systemic inflammatory response, almost exclusively due to severe bacterial sepsis or septic shock. (NOTE)         ICU PCT Algorithm               Non ICU PCT Algorithm    ----------------------------     ------------------------------         PCT < 0.25 ng/mL                 PCT < 0.1 ng/mL     Stopping of antibiotics            Stopping of antibiotics       strongly encouraged.               strongly encouraged.    ----------------------------     ------------------------------       PCT level decrease by               PCT < 0.25 ng/mL       >= 80% from peak PCT       OR PCT 0.25 - 0.5 ng/mL          Stopping of antibiotics                                             encouraged.     Stopping of antibiotics           encouraged.    ----------------------------     ------------------------------       PCT level decrease by              PCT >= 0.25 ng/mL       < 80% from peak PCT        AND PCT >= 0.5 ng/mL             Continuing antibiotics                                              encouraged.  Continuing antibiotics            encouraged.    ----------------------------     ------------------------------     PCT level increase compared          PCT > 0.5 ng/mL         with peak PCT AND          PCT >= 0.5 ng/mL             Escalation of antibiotics                                          strongly encouraged.      Escalation of antibiotics         strongly encouraged.   Valproic acid level     Status: Abnormal   Collection Time: 02/18/17  3:59 AM  Result Value Ref Range   Valproic Acid Lvl 25 (L) 50.0 - 100.0 ug/mL  Glucose, capillary     Status: None   Collection Time: 02/18/17  4:08 AM  Result Value Ref Range   Glucose-Capillary 88 65 - 99 mg/dL  Glucose, capillary     Status: None   Collection Time: 02/18/17  7:28 AM  Result Value Ref Range   Glucose-Capillary 95 65 - 99 mg/dL  Glucose, capillary     Status: None   Collection Time: 02/18/17 11:41 AM  Result Value Ref Range   Glucose-Capillary 76 65 - 99 mg/dL  Valproic acid level     Status: Abnormal   Collection Time: 02/19/17  6:30 AM  Result Value Ref Range   Valproic Acid Lvl 40 (L) 50.0 - 100.0 ug/mL  Basic metabolic panel     Status: Abnormal   Collection Time: 02/19/17  8:57 AM  Result Value Ref Range   Sodium 144 135 - 145 mmol/L   Potassium 3.2 (L) 3.5 - 5.1 mmol/L   Chloride 103 101 - 111 mmol/L   CO2 24 22 - 32 mmol/L   Glucose, Bld 71 65 - 99 mg/dL   BUN 59 (H) 6 - 20 mg/dL   Creatinine, Ser 7.27 (H) 0.61 - 1.24 mg/dL   Calcium 10.2 8.9 - 10.3 mg/dL   GFR calc non Af Amer 8 (L) >60 mL/min   GFR calc Af Amer 9 (L) >60 mL/min    Comment: (NOTE) The eGFR has been calculated using the CKD EPI equation. This calculation has not been validated in all clinical situations. eGFR's persistently <60 mL/min signify possible Chronic Kidney Disease.    Anion gap 17 (H) 5 - 15    Current Facility-Administered Medications  Medication Dose Route Frequency Provider Last Rate Last Dose  . acetaminophen (TYLENOL) tablet 650 mg  650 mg Oral Q6H PRN Fritzi Mandes, MD       Or  . acetaminophen (TYLENOL) suppository 650 mg  650 mg Rectal Q6H PRN Fritzi Mandes, MD      . amLODipine (NORVASC) tablet 5 mg  5 mg Oral Daily Wilhelmina Mcardle, MD   Stopped at 02/19/17 1615  . bacitracin ophthalmic ointment   Left Eye BID Dorene Sorrow S, NP      . bisacodyl (DULCOLAX)  suppository 10 mg  10 mg Rectal Daily PRN Tukov, Magadalene S, NP      . budesonide (PULMICORT) nebulizer solution 0.25 mg  0.25 mg Nebulization BID PRN Awilda Bill, NP      .  ceFEPIme (MAXIPIME) 1 g in dextrose 5 % 50 mL IVPB  1 g Intravenous q1800 Napoleon Form, [facility]   Stopped at 02/18/17 1714  . chlorhexidine gluconate (MEDLINE KIT) (PERIDEX) 0.12 % solution 15 mL  15 mL Mouth Rinse BID Dorene Sorrow S, NP   15 mL at 02/19/17 0920  . fluPHENAZine (PROLIXIN) tablet 5 mg  5 mg Oral QHS Flora Lipps, MD   Stopped at 02/18/17 2141  . heparin injection 5,000 Units  5,000 Units Subcutaneous Q8H Fritzi Mandes, MD   5,000 Units at 02/19/17 1306  . hydrALAZINE (APRESOLINE) injection 10-20 mg  10-20 mg Intravenous Q4H PRN Wilhelmina Mcardle, MD   10 mg at 02/19/17 1539  . ipratropium-albuterol (DUONEB) 0.5-2.5 (3) MG/3ML nebulizer solution 3 mL  3 mL Nebulization Q4H PRN Awilda Bill, NP      . MEDLINE mouth rinse  15 mL Mouth Rinse QID Mikael Spray, NP   Stopped at 02/19/17 1600  . metoprolol tartrate (LOPRESSOR) injection 2.5-5 mg  2.5-5 mg Intravenous Q3H PRN Wilhelmina Mcardle, MD   5 mg at 02/19/17 1528  . metoprolol tartrate (LOPRESSOR) injection 5 mg  5 mg Intravenous Q6H Wilhelmina Mcardle, MD   Stopped at 02/19/17 1646  . pantoprazole (PROTONIX) EC tablet 40 mg  40 mg Oral Daily Wilhelmina Mcardle, MD   Stopped at 02/19/17 1700  . QUEtiapine (SEROQUEL) tablet 200 mg  200 mg Oral BID Wilhelmina Mcardle, MD   200 mg at 02/19/17 1304  . sennosides (SENOKOT) 8.8 MG/5ML syrup 5 mL  5 mL Per Tube BID PRN Tukov, Magadalene S, NP      . sodium chloride flush (NS) 0.9 % injection 10-40 mL  10-40 mL Intracatheter PRN Fritzi Mandes, MD      . valproate (DEPACON) 1,500 mg in dextrose 5 % 50 mL IVPB  1,500 mg Intravenous Q12H Alexis Goodell, MD        Musculoskeletal: Strength & Muscle Tone: decreased Gait & Station: unable to stand Patient leans: N/A  Psychiatric Specialty Exam: Physical Exam   Nursing note and vitals reviewed. Constitutional: He appears well-developed and well-nourished.  HENT:  Head: Normocephalic and atraumatic.  Eyes: Pupils are equal, round, and reactive to light. Conjunctivae are normal.  Neck: Normal range of motion.  Cardiovascular: Normal heart sounds.   Respiratory: Effort normal.  GI: Soft.  Genitourinary:     Musculoskeletal: Normal range of motion.  Neurological: He is alert. He displays tremor. He exhibits abnormal muscle tone.  Skin: Skin is warm and dry.  Psychiatric: His affect is blunt. His speech is delayed. He is agitated and slowed. Cognition and memory are impaired. He is noncommunicative.    Review of Systems  Unable to perform ROS: Medical condition    Blood pressure (!) 150/109, pulse (!) 127, temperature (!) 97.5 F (36.4 C), temperature source Oral, resp. rate 19, height 5' 10"  (1.778 m), weight 84.1 kg (185 lb 6.5 oz), SpO2 100 %.Body mass index is 26.6 kg/m.  General Appearance: Disheveled  Eye Contact:  Minimal  Speech:  Garbled  Volume:  Decreased  Mood:  Negative  Affect:  Negative  Thought Process:  NA  Orientation:  Negative  Thought Content:  Negative  Suicidal Thoughts:  No  Homicidal Thoughts:  No  Memory:  Negative  Judgement:  Negative  Insight:  Negative  Psychomotor Activity:  Negative  Concentration:  Concentration: Negative  Recall:  Negative  Fund of Knowledge:  Negative  Language:  Negative  Akathisia:  Negative  Handed:  Right  AIMS (if indicated):     Assets:  Housing  ADL's:  Impaired  Cognition:  Impaired,  Severe  Sleep:        Treatment Plan Summary: Daily contact with patient to assess and evaluate symptoms and progress in treatment, Medication management and Plan 56 year old man with a history of chronic mental illness who came into the hospital with acute medical problems and now he is clinically delirious. Unclear what his baseline is but presumably better than it is now if he is  capable of having a slip and fall at home. It looks like some of his medicine has been restarted especially the Seroquel and Prolixin but not the Risperdal. I think given his acute delirium and multiple medical problems it's reasonable to start lower with his medicine. I'm not going to change anything else for now. I will continue to follow-up and we will hope that as his medical condition improves we can get a better picture of what he needs psychiatrically.  Disposition: Patient does not meet criteria for psychiatric inpatient admission. Supportive therapy provided about ongoing stressors.  Alethia Berthold, MD 02/19/2017 5:51 PM

## 2017-02-19 NOTE — Clinical Social Work Note (Signed)
Clinical Social Work Assessment  Patient Details  Name: Jason Diaz MRN: [id number] Date of Birth: 03/14/1961  Date of referral:  02/19/17               Reason for consult:  Discharge Planning [facility]                Permission sought to share information with:    Permission granted to share information::     Name::        Agency::     Relationship::     Contact Information:     Housing/Transportation Living arrangements for the past 2 months:  Skilled Building surveyorursing Facility Source of Information:  Medical Team, Sports coachCase Manager, Facility, Other (Comment Required), Parent (other family members) Patient Interpreter Needed:  None Criminal Activity/Legal Involvement Pertinent to Current Situation/Hospitalization:  No - Comment as needed Significant Relationships:  Parents, [employer] Support Lives with:  Facility Resident Do you feel safe going back to the place where you live?  Yes Need for family participation in patient care:  Yes (Comment)  Care giving concerns:  Patient admitted and intubated for airway safety.  Patient extubated on 7/17 and has become combative and agitated.  [facility] due [facility].  Patient at this time is stable in ICU, but not at baseline.  [facility] and has a 30 day passar which will expire on 7/26. Current plan per facility is to return to SNF at DC.   Social Worker assessment / plan:  Assessment completed. Plan: continue medical work up with anticipated DC back to SNF to complete rehab.  Long term plan return to group home once able and medically/physically able.  Mother involved in care as guardian. Aware of admission and plans.  Pending clinical course, will continue to follow for assistance with discharge disposition. Doug from SNF is aware of admission and working as well on [facility] plan.  Employment status:  Disabled (Comment on whether or not  currently receiving Disability) Insurance information:  [place]ic[place], [place] Recommend[place]:  Not assessed at this time Information / Referral to community resources:     Patient/Family's Response to care:  Agreeable  Patient/Family's Understanding of and Emotional Response to Diagnosis, Current Treatment, and Prognosis:  Still assessing.  Emotional Assessment Appearance:  Appears stated age Attitude/Demeanor/Rapport:    Affect (typically observed):  Other (combative, extubated, has sitter/mitts) Orientation:  Oriented to Self Alcohol / Substance use:  Not Applicable Psych involvement (Current and /or in the community):  Outpatient Provider  Discharge Needs  Concerns to be addressed:  Care Coordination Readmission within the last 30 days:  Yes Current discharge risk:  None Barriers to Discharge:  Continued Medical Work up, Other (Return to SNF vs return to group home, passar expires on 7/26.)   Raye SorrowCoble, Jason Marchena N, LCSW 02/19/2017, 11:25 AM

## 2017-02-19 NOTE — Progress Notes (Signed)
[facility] [place] [facility]   PATIENT NAME: Jason Diaz    MR#:  [id number]  DATE OF BIRTH:  1961-07-05  SUBJECTIVE:  Awake But noncommunicative  Per RN gets agitated  REVIEW OF SYSTEMS:   Review of Systems  Constitutional: Negative for chills, fever and weight loss.  HENT: Negative for ear discharge, ear pain and nosebleeds.   Eyes: Negative for blurred vision, pain and discharge.  Respiratory: Negative for sputum production, shortness of breath, wheezing and stridor.   Cardiovascular: Negative for chest pain, palpitations, orthopnea and PND.  Gastrointestinal: Negative for abdominal pain, diarrhea, nausea and vomiting.  Genitourinary: Negative for frequency and urgency.  Musculoskeletal: Negative for back pain and joint pain.  Neurological: Positive for weakness. Negative for sensory change, speech change and focal weakness.  Psychiatric/Behavioral: Negative for depression and hallucinations. The patient is not nervous/anxious.    Tolerating Diet: Nothing by mouth Tolerating PT: pending  DRUG ALLERGIES:  No Known Allergies  VITALS:  Blood pressure (!) 154/102, pulse (!) 131, temperature 98 F (36.7 C), temperature source Oral, resp. rate 19, height 5\' 10"  (1.778 m), weight 84.1 kg (185 lb 6.5 oz), SpO2 100 %.  PHYSICAL EXAMINATION:   Physical Exam  GENERAL:  56 y.o.-year-old patient lying in the bed with no acute distress. Critically ill EYES: Pupils equal, round, reactive to light and accommodation. No scleral icterus. Extraocular muscles intact.  HEENT: Head atraumatic, normocephalic. Oropharynx and nasopharynx clear. NECK:  Supple, no jugular venous distention. No thyroid enlargement, no tenderness.  LUNGS: Normal breath sounds bilaterally, no wheezing, rales, rhonchi. No use of accessory muscles of respiration.  CARDIOVASCULAR: S1, S2 normal. No murmurs, rubs, or gallops.  ABDOMEN: Soft, nontender, nondistended. Bowel sounds  present. No organomegaly or mass.  EXTREMITIES: No cyanosis, clubbing or edema b/l.    NEUROLOGIC:moves extremities well spontaneously PSYCHIATRIC: agitation, does not communicate SKIN: No obvious rash, lesion, or ulcer.   LABORATORY PANEL:  CBC  Recent Labs Lab 02/17/17 0500  WBC 12.2*  HGB 8.0*  HCT 23.2*  PLT 304    Chemistries   Recent Labs Lab 02/16/17 1710  02/17/17 0500 02/19/17 0857  NA 133*  --  138 144  K 7.5*  < > 3.5 3.2*  CL 102  --  97* 103  CO2 11*  --  27 24  GLUCOSE 137*  --  115* 71  BUN 134*  --  71* 59*  CREATININE 15.14*  --  8.39* 7.27*  CALCIUM 9.5  --  8.3* 10.2  MG  --   --  1.9  --   AST 28  --   --   --   ALT 22  --   --   --   ALKPHOS 109  --   --   --   BILITOT 1.0  --   --   --   < > = values in this interval not displayed. Cardiac Enzymes  Recent Labs Lab 02/16/17 1615  TROPONINI 0.04*   RADIOLOGY:  Dg Abd 1 View  Result Date: 02/17/2017 CLINICAL DATA:  Initial evaluation for OG tube placement. EXAM: ABDOMEN - 1 VIEW COMPARISON:  None. FINDINGS: Enteric tube visualized at the upper abdomen, tip in side hole overlying the stomach. Tip projects distally. Visualized bowel gas pattern within normal limits without evidence for obstruction or ileus. No abnormal bowel wall thickening. No soft tissue mass or abnormal calcification. IMPRESSION: Tip in side hole of enteric tube overlying the stomach, beyond  the GE junction. Tip projects distally. Electronically Signed   By: Rise Mu M.D.   On: 02/17/2017 19:23   ASSESSMENT AND PLAN:  Jason Diaz  is a 56 y.o. male with a known history of Chronic kidney disease stage IV baseline creatinine around 3.4, GERD, schizoaffective disorder., Hypertension comes to the emergency room from [facility] after patient slipping in his urine causing him to fall and hitting his head on the nightstand. Staff noted shaking questionable seizure received 4 mg of Versed. Patient was quite  confused came to the emergency room and was found to be in septic shock. His white count was elevated to 17,000 was tachycardic and creatinine was 15.4 with potassium of 7.5.  1. Acute metabolic encephalopathy multifactorial secondary to septic shock, acute on chronic severe renal failure with dehydration -Patient was intubated placed on the ventilator---extubated 02/18/17 -Continue to monitor for seizure activity. -Appreciate neurology consultation. Patient on IV Depakote  2. Septic shock source appears urine -Patient was discharged 01/28/2017 with an indwelling Foley catheter given significant hematuria and difficulty urination. He was supposed to follow with urology not sure if he made to the appointment -IV cefepime  - urine culture growing more than 100,000 citrobacter and pseudomonas -blood culture--->  negative.  3. Acute on chronic kidney failure stage IV/5 -Baseline creatinine 3.35------ came in with creatinine of 15.49--- continue hemodialysis---7.27 -Nephrology consultation appreciated  4. Acute severe hyperkalemia -Patient received IV bicarbonate, dextrose with insulin and calcium gluconate -Follow up potassium with dialysis -Came in with potassium of 7.5----3.5  5. Leukocytosis due to #2  6. DVT prophylaxis subcutaneous heparin  7. Possible seizures versus jerky/tremor movements and to uremic encephalopathy -Empirically on IV depakote -Appreciate neurology input. -EEG done and showing triphasic activity waveforms---cont anti seizure meds  8. Acute delirium/agitation with history of schizoaffective disorder -We'll get psychiatry consultation   Case discussed with Care Management/Social Worker. Management plans discussed with the patient, family and they are in agreement.  CODE STATUS: full  DVT Prophylaxis: heparin  TOTAL TIME TAKING CARE OF THIS PATIENT: *30* minutes.  >50% time spent on counselling and coordination of care    Note: This dictation was  prepared with Dragon dictation along with smaller phrase technology. Any transcriptional errors that result from this process are unintentional.  Krish Bailly M.D on 02/19/2017 at 3:50 PM  Between 7am to 6pm - Pager - [phone]  After 6pm go to [email or web address] - Social research officer, government  [facility]  Office  [phone]  CC: Primary care physician; Armando Gang, FNP

## 2017-02-19 NOTE — Progress Notes (Signed)
Subjective: Patient unchanged since yesterday.  No further twitching noted.    Objective: Current vital signs: BP (!) 163/88   Pulse (!) 131   Temp 98.1 F (36.7 C) (Oral)   Resp (!) 30   Ht 5' 10"  (1.778 m)   Wt 84.1 kg (185 lb 6.5 oz)   SpO2 100%   BMI 26.60 kg/m  Vital signs in last 24 hours: Temp:  [97.3 F (36.3 C)-99 F (37.2 C)] 98.1 F (36.7 C) (07/18 0800) Pulse Rate:  [94-131] 131 (07/18 0800) Resp:  [14-30] 30 (07/18 0800) BP: (112-163)/(69-114) 163/88 (07/18 0800) SpO2:  [85 %-100 %] 100 % (07/18 0800)  Intake/Output from previous day: 07/17 0701 - 07/18 0700 In: 743.3 [I.V.:623.3; NG/GT:20; IV Piggyback:100] Out: 4492 [Urine:1675] Intake/Output this shift: No intake/output data recorded. Nutritional status: Diet NPO time specified  Neurologic Exam: Mental Status: Alert.  Does no follow commands.  No speech.   Cranial Nerves: II: Discs flat bilaterally; Blinks to bilateral confrontation, pupils equal, round, reactive to light and accommodation III,IV, VI: ptosis not present, extra-ocular motions intact bilaterally V,VII: corneals intact bilaterally VIII: unable to be tested IX,X: gag reflex present XI: bilateral shoulder shrug XII: unable to be tested Motor: Moves all extremities against gravity spontaneously.  No focal weakness noted.   Sensory: Responds to noxious stimuli throughout  Lab Results: Basic Metabolic Panel:  Recent Labs Lab 02/16/17 1615 02/16/17 1710 02/16/17 2140 02/16/17 2236 02/17/17 0500 02/19/17 0857  NA 134* 133*  --   --  138 144  K UNABLE TO REPORT DUE TO HEMOLYSIS 7.5* 3.8 3.6 3.5 3.2*  CL 102 102  --   --  97* 103  CO2 10* 11*  --   --  27 24  GLUCOSE 143* 137*  --   --  115* 71  BUN 140* 134*  --   --  71* 59*  CREATININE 15.49* 15.14*  --   --  8.39* 7.27*  CALCIUM 9.8 9.5  --   --  8.3* 10.2  MG  --   --   --   --  1.9  --   PHOS  --   --   --   --  6.8*  --     Liver Function Tests:  Recent Labs Lab  02/16/17 1615 02/16/17 1710  AST UNABLE TO REPORT DUE TO HEMOLYSIS 28  ALT 18 22  ALKPHOS 114 109  BILITOT 1.5* 1.0  PROT 8.2* 7.9  ALBUMIN 3.1* 3.0*   No results for input(s): LIPASE, AMYLASE in the last 168 hours. No results for input(s): AMMONIA in the last 168 hours.  CBC:  Recent Labs Lab 02/16/17 1615 02/17/17 0500  WBC 17.6* 12.2*  NEUTROABS 16.7*  --   HGB 10.0* 8.0*  HCT 29.6* 23.2*  MCV 88.8 85.9  PLT 398 304    Cardiac Enzymes:  Recent Labs Lab 02/16/17 1615  TROPONINI 0.04*    Lipid Panel:  Recent Labs Lab 02/16/17 2051  TRIG 265*    CBG:  Recent Labs Lab 02/17/17 2044 02/18/17 0006 02/18/17 0408 02/18/17 0728 02/18/17 1141  GLUCAP 77 102* 88 95 39    Microbiology: Results for orders placed or performed during the hospital encounter of 02/16/17  Urine Culture     Status: Abnormal   Collection Time: 02/16/17  4:15 PM  Result Value Ref Range Status   Specimen Description URINE, RANDOM  Final   Special Requests NONE  Final   Culture (A)  Final    >=  100,000 COLONIES/mL CITROBACTER KOSERI 50,000 COLONIES/mL PSEUDOMONAS AERUGINOSA    Report Status 02/19/2017 FINAL  Final   Organism ID, Bacteria CITROBACTER KOSERI (A)  Final   Organism ID, Bacteria PSEUDOMONAS AERUGINOSA (A)  Final      Susceptibility   Citrobacter koseri - MIC*    CEFAZOLIN <=4 SENSITIVE Sensitive     CEFTRIAXONE <=1 SENSITIVE Sensitive     CIPROFLOXACIN <=0.25 SENSITIVE Sensitive     GENTAMICIN <=1 SENSITIVE Sensitive     IMIPENEM <=0.25 SENSITIVE Sensitive     NITROFURANTOIN 32 SENSITIVE Sensitive     TRIMETH/SULFA <=20 SENSITIVE Sensitive     PIP/TAZO <=4 SENSITIVE Sensitive     * >=100,000 COLONIES/mL CITROBACTER KOSERI   Pseudomonas aeruginosa - MIC*    CEFTAZIDIME 4 SENSITIVE Sensitive     CIPROFLOXACIN <=0.25 SENSITIVE Sensitive     GENTAMICIN <=1 SENSITIVE Sensitive     IMIPENEM 1 SENSITIVE Sensitive     PIP/TAZO 8 SENSITIVE Sensitive     CEFEPIME 2  SENSITIVE Sensitive     * 50,000 COLONIES/mL PSEUDOMONAS AERUGINOSA  Culture, blood (Routine X 2) w Reflex to ID Panel     Status: None (Preliminary result)   Collection Time: 02/16/17  8:42 PM  Result Value Ref Range Status   Specimen Description BLOOD LEFT ARM  Final   Special Requests   Final    BOTTLES DRAWN AEROBIC AND ANAEROBIC Blood Culture adequate volume   Culture NO GROWTH 3 DAYS  Final   Report Status PENDING  Incomplete  Culture, blood (Routine X 2) w Reflex to ID Panel     Status: None (Preliminary result)   Collection Time: 02/16/17  8:52 PM  Result Value Ref Range Status   Specimen Description BLOOD LEFT HAND  Final   Special Requests   Final    BOTTLES DRAWN AEROBIC AND ANAEROBIC Blood Culture adequate volume   Culture NO GROWTH 3 DAYS  Final   Report Status PENDING  Incomplete  MRSA PCR Screening     Status: None   Collection Time: 02/16/17  9:00 PM  Result Value Ref Range Status   MRSA by PCR NEGATIVE NEGATIVE Final    Comment:        The GeneXpert MRSA Assay (FDA approved for NASAL specimens only), is one component of a comprehensive MRSA colonization surveillance program. It is not intended to diagnose MRSA infection nor to guide or monitor treatment for MRSA infections.     Coagulation Studies: No results for input(s): LABPROT, INR in the last 72 hours.  Imaging: Dg Abd 1 View  Result Date: 02/17/2017 CLINICAL DATA:  Initial evaluation for OG tube placement. EXAM: ABDOMEN - 1 VIEW COMPARISON:  None. FINDINGS: Enteric tube visualized at the upper abdomen, tip in side hole overlying the stomach. Tip projects distally. Visualized bowel gas pattern within normal limits without evidence for obstruction or ileus. No abnormal bowel wall thickening. No soft tissue mass or abnormal calcification. IMPRESSION: Tip in side hole of enteric tube overlying the stomach, beyond the GE junction. Tip projects distally. Electronically Signed   By: Jeannine Boga M.D.    On: 02/17/2017 19:23    Medications:  I have reviewed the patient's current medications. Scheduled: . bacitracin   Left Eye BID  . chlorhexidine gluconate (MEDLINE KIT)  15 mL Mouth Rinse BID  . fluPHENAZine  5 mg Oral QHS  . heparin  5,000 Units Subcutaneous Q8H  . mouth rinse  15 mL Mouth Rinse QID  . metoprolol tartrate  5 mg Intravenous Q6H  . QUEtiapine  200 mg Oral BID    Assessment/Plan: Patient stable since yesterday.  No further twitching noted but remains nonverbal.  Depakote level 40 after increase in dosage.  Recommendations: 1.  EEG today 2.  Increase Depakote to 1514m q 12 hours   LOS: 3 days   LAlexis Goodell MD Neurology [phone] [id number]/18/2018  11:31 AM

## 2017-02-19 NOTE — Telephone Encounter (Signed)
error 

## 2017-02-20 DIAGNOSIS — R410 Disorientation, unspecified: Secondary | ICD-10-CM

## 2017-02-20 LAB — BASIC METABOLIC PANEL
ANION GAP: 17 — AB (ref 5–15)
BUN: 65 mg/dL — ABNORMAL HIGH (ref 6–20)
CALCIUM: 10.8 mg/dL — AB (ref 8.9–10.3)
CO2: 24 mmol/L (ref 22–32)
Chloride: 109 mmol/L (ref 101–111)
Creatinine, Ser: 8.08 mg/dL — ABNORMAL HIGH (ref 0.61–1.24)
GFR calc non Af Amer: 7 mL/min — ABNORMAL LOW (ref >60)
GFR, EST AFRICAN AMERICAN: 8 mL/min — AB (ref >60)
Glucose, Bld: 85 mg/dL (ref 65–99)
POTASSIUM: 3.1 mmol/L — AB (ref 3.5–5.1)
Sodium: 150 mmol/L — ABNORMAL HIGH (ref 135–145)

## 2017-02-20 LAB — CBC
HCT: 26.5 % — ABNORMAL LOW (ref 40.0–52.0)
HEMOGLOBIN: 8.8 g/dL — AB (ref 13.0–18.0)
MCH: 28.9 pg (ref 26.0–34.0)
MCHC: 33.1 g/dL (ref 32.0–36.0)
MCV: 87.2 fL (ref 80.0–100.0)
Platelets: 306 10*3/uL (ref 150–440)
RBC: 3.04 MIL/uL — AB (ref 4.40–5.90)
RDW: 14.8 % — ABNORMAL HIGH (ref 11.5–14.5)
WBC: 20.3 10*3/uL — ABNORMAL HIGH (ref 3.8–10.6)

## 2017-02-20 MED ORDER — DILTIAZEM HCL 30 MG PO TABS
30.0000 mg | ORAL_TABLET | Freq: Four times a day (QID) | ORAL | Status: DC
  Administered 2017-02-20 – 2017-02-26 (×19): 30 mg via ORAL
  Filled 2017-02-20 (×31): qty 1

## 2017-02-20 MED ORDER — POTASSIUM CL IN DEXTROSE 5% 20 MEQ/L IV SOLN
20.0000 meq | INTRAVENOUS | Status: DC
  Administered 2017-02-20 – 2017-02-24 (×6): 20 meq via INTRAVENOUS
  Filled 2017-02-20 (×10): qty 1000

## 2017-02-20 MED ORDER — METOPROLOL TARTRATE 25 MG PO TABS
25.0000 mg | ORAL_TABLET | Freq: Two times a day (BID) | ORAL | Status: DC
  Administered 2017-02-20 – 2017-02-26 (×12): 25 mg via ORAL
  Filled 2017-02-20 (×14): qty 1

## 2017-02-20 MED ORDER — TRAZODONE HCL 50 MG PO TABS
50.0000 mg | ORAL_TABLET | Freq: Every day | ORAL | Status: DC
  Administered 2017-02-21 (×2): 50 mg via ORAL
  Filled 2017-02-20 (×2): qty 1

## 2017-02-20 MED ORDER — RENA-VITE PO TABS
1.0000 | ORAL_TABLET | Freq: Every day | ORAL | Status: DC
  Administered 2017-02-20 – 2017-02-25 (×5): 1 via ORAL
  Filled 2017-02-20 (×5): qty 1

## 2017-02-20 MED ORDER — NEPRO/CARBSTEADY PO LIQD
237.0000 mL | Freq: Three times a day (TID) | ORAL | Status: DC
Start: 2017-02-20 — End: 2017-02-26
  Administered 2017-02-20 – 2017-02-26 (×8): 237 mL via ORAL

## 2017-02-20 NOTE — Progress Notes (Signed)
Palliative NP to meet with patient's mother, Jason Diaz, tomorrow around 11am. Thank you for the opportunity to participate in the care of Jason Diaz.   NO CHARGE  Vennie HomansMegan Lorie Melichar, FNP-C Palliative Medicine Team  Phone: [phone] Fax: [phone]

## 2017-02-20 NOTE — Progress Notes (Signed)
MD notified of patient's high HR - 130bpm on telemetry monitor. Orders for PO metoprolol 25mg  BID. First dose given and hr and BP have improved. Pt currently resting in bed. Will continue to monitor. Sitter at the bedside.

## 2017-02-20 NOTE — Consult Note (Signed)
[facility] Face-to-Face Psychiatry Consult   Reason for Consult:  Consult for 56 year old man with a history of schizophrenia who was brought into the hospital after a fall and injury and then required intubation. Referring Physician:  Posey Pronto Patient Identification: Jason Diaz MRN:  [id number] Principal Diagnosis: Acute delirium Diagnosis:   Patient Active Problem List   Diagnosis Date Noted  . Acute delirium [R41.0] 02/19/2017  . Acute respiratory failure ([place]) [J96.00]   . Seizure-like activity ([place]) [R56.9]   . Sepsis ([place]) [A41.9]   . Septic shock ([place]) [A41.9, R65.21] 02/16/2017  . Schizoaffective disorder ([place]) [F25.9] 02/16/2017  . Hypercalcemia [E83.52] 01/27/2017  . Hematuria [R31.9] 01/27/2017  . Acute urinary retention [R33.8] 01/27/2017  . Hyponatremia [E87.1] 01/27/2017  . Hypokalemia [E87.6] 01/27/2017  . ARF (acute renal failure) ([place]) [N17.9] 01/23/2017    Total Time spent with patient: 20 minutes  Subjective:   Jason Diaz is a 56 y.o. male patient admitted with patient is not able to communicate.  Follow-up for Thursday the 19th. Patient today appeared to be awake but was even less communicative than yesterday. He was not able to respond verbally at all when I spoke with him. Not able to follow any commands. Nursing tells me that when they fed him he was able to swallow food but didn't follow any other commands and hasn't spoken today. Patient appears to be more delirious. Fortunately has not been agitated or aggressive.  HPI:  Attempted to interview patient. Reviewed old chart. This is a 56 year old man who carries a diagnosis of schizophrenia or schizoaffective disorder and who lives in a group home. He was brought to the hospital after reportedly having a fall and hitting his head. He was thought to have some seizure-like activity but was also found to be an acute worsening renal failure. Patient was intubated and was in the ICU briefly and has now been extubated  and moved to the floor. Patient on evaluation had his eyes open and appeared to be awake but was not able to verbally interact. He was able to answer yes to a couple of questions but was not able to say anything else and was not able to follow direct commands or say his name. Affect flat and somewhat confused.  Social history: According to the chart he lives in a group home or assisted living type setting. He reports that they're his family at times that might be involved but the closest communication seems to be with the manager of the facility.  Medical history: Chronic renal failure which has worsened in the hospital. Possible new seizure activity.  Substance abuse history: No information  Past Psychiatric History: We don't have any previous psychiatric information other than the list of medicines that he was on. It looks like he was on quite a bit of psychiatric medicine including 3 antipsychotics between the Seroquel risperdal and Prolixin. We don't have any record of prior hospitalizations or symptoms at baseline.  Risk to Self: Is patient at risk for suicide?: No Risk to Others:   Prior Inpatient Therapy:   Prior Outpatient Therapy:    Past Medical History:  Past Medical History:  Diagnosis Date  . CKD (chronic kidney disease), stage IV ([place])   . COPD (chronic obstructive pulmonary disease) ([place])   . GERD (gastroesophageal reflux disease)   . Hypertension   . Multinodular goiter   . Schizoaffective disorder [facility])     Past Surgical History:  Procedure Laterality Date  . TONSILLECTOMY    .  TOOTH EXTRACTION     Family History:  Family History  Problem Relation Age of Onset  . Kidney disease Father   . Diabetes Father    Family Psychiatric  History: Unknown Social History:  History  Alcohol Use No     History  Drug Use No    Social History   Social History  . Marital status: Single    Spouse name: N/A  . Number of children: N/A  . Years of education: N/A    Social History Main Topics  . Smoking status: Current Every Day Smoker    Packs/day: 0.25    Types: Cigarettes  . Smokeless tobacco: Never Used  . Alcohol use No  . Drug use: No  . Sexual activity: Not Asked   Other Topics Concern  . None   Social History Narrative   From [facility] group home   Additional Social History:    Allergies:  No Known Allergies  Labs:  Results for orders placed or performed during the hospital encounter of 02/16/17 (from the past 48 hour(s))  Valproic acid level     Status: Abnormal   Collection Time: 02/19/17  6:30 AM  Result Value Ref Range   Valproic Acid Lvl 40 (L) 50.0 - 100.0 ug/mL  Basic metabolic panel     Status: Abnormal   Collection Time: 02/19/17  8:57 AM  Result Value Ref Range   Sodium 144 135 - 145 mmol/L   Potassium 3.2 (L) 3.5 - 5.1 mmol/L   Chloride 103 101 - 111 mmol/L   CO2 24 22 - 32 mmol/L   Glucose, Bld 71 65 - 99 mg/dL   BUN 59 (H) 6 - 20 mg/dL   Creatinine, Ser 7.27 (H) 0.61 - 1.24 mg/dL   Calcium 10.2 8.9 - 10.3 mg/dL   GFR calc non Af Amer 8 (L) >60 mL/min   GFR calc Af Amer 9 (L) >60 mL/min    Comment: (NOTE) The eGFR has been calculated using the CKD EPI equation. This calculation has not been validated in all clinical situations. eGFR's persistently <60 mL/min signify possible Chronic Kidney Disease.    Anion gap 17 (H) 5 - 15  Basic metabolic panel     Status: Abnormal   Collection Time: 02/20/17 10:01 AM  Result Value Ref Range   Sodium 150 (H) 135 - 145 mmol/L   Potassium 3.1 (L) 3.5 - 5.1 mmol/L   Chloride 109 101 - 111 mmol/L   CO2 24 22 - 32 mmol/L   Glucose, Bld 85 65 - 99 mg/dL   BUN 65 (H) 6 - 20 mg/dL   Creatinine, Ser 8.08 (H) 0.61 - 1.24 mg/dL   Calcium 10.8 (H) 8.9 - 10.3 mg/dL   GFR calc non Af Amer 7 (L) >60 mL/min   GFR calc Af Amer 8 (L) >60 mL/min    Comment: (NOTE) The eGFR has been calculated using the CKD EPI equation. This calculation has not been validated in all  clinical situations. eGFR's persistently <60 mL/min signify possible Chronic Kidney Disease.    Anion gap 17 (H) 5 - 15  CBC     Status: Abnormal   Collection Time: 02/20/17 10:01 AM  Result Value Ref Range   WBC 20.3 (H) 3.8 - 10.6 K/uL   RBC 3.04 (L) 4.40 - 5.90 MIL/uL   Hemoglobin 8.8 (L) 13.0 - 18.0 g/dL   HCT 26.5 (L) 40.0 - 52.0 %   MCV 87.2 80.0 - 100.0 fL  MCH 28.9 26.0 - 34.0 pg   MCHC 33.1 32.0 - 36.0 g/dL   RDW 14.8 (H) 11.5 - 14.5 %   Platelets 306 150 - 440 K/uL    Current Facility-Administered Medications  Medication Dose Route Frequency Provider Last Rate Last Dose  . acetaminophen (TYLENOL) tablet 650 mg  650 mg Oral Q6H PRN Fritzi Mandes, MD       Or  . acetaminophen (TYLENOL) suppository 650 mg  650 mg Rectal Q6H PRN Fritzi Mandes, MD   650 mg at 02/19/17 2034  . bacitracin ophthalmic ointment   Left Eye BID Dorene Sorrow S, NP      . bisacodyl (DULCOLAX) suppository 10 mg  10 mg Rectal Daily PRN Tukov, Magadalene S, NP      . budesonide (PULMICORT) nebulizer solution 0.25 mg  0.25 mg Nebulization BID PRN Awilda Bill, NP      . ceFEPIme (MAXIPIME) 1 g in dextrose 5 % 50 mL IVPB  1 g Intravenous q1800 Fritzi Mandes, MD   Stopped at 02/19/17 2243  . chlorhexidine gluconate (MEDLINE KIT) (PERIDEX) 0.12 % solution 15 mL  15 mL Mouth Rinse BID Dorene Sorrow S, NP   15 mL at 02/19/17 2033  . dextrose 5 % with KCl 20 mEq / L  infusion  20 mEq Intravenous Continuous Lateef, [place], MD 75 mL/hr at 02/20/17 1239 20 mEq at 02/20/17 1239  . diltiazem (CARDIZEM) tablet 30 mg  30 mg Oral Q6H Fritzi Mandes, MD   30 mg at 02/20/17 1407  . feeding supplement (NEPRO CARB STEADY) liquid 237 mL  237 mL Oral TID BM Fritzi Mandes, MD      . fluPHENAZine (PROLIXIN) tablet 5 mg  5 mg Oral QHS Flora Lipps, MD   5 mg at 02/20/17 0105  . heparin injection 5,000 Units  5,000 Units Subcutaneous Q8H Fritzi Mandes, MD   5,000 Units at 02/20/17 1406  . hydrALAZINE (APRESOLINE) injection 10-20  mg  10-20 mg Intravenous Q4H PRN Wilhelmina Mcardle, MD   10 mg at 02/19/17 1539  . ipratropium-albuterol (DUONEB) 0.5-2.5 (3) MG/3ML nebulizer solution 3 mL  3 mL Nebulization Q4H PRN Awilda Bill, NP      . MEDLINE mouth rinse  15 mL Mouth Rinse QID Tukov, Magadalene S, NP   15 mL at 02/20/17 0400  . metoprolol tartrate (LOPRESSOR) injection 2.5-5 mg  2.5-5 mg Intravenous Q3H PRN Wilhelmina Mcardle, MD   2.5 mg at 02/20/17 1116  . metoprolol tartrate (LOPRESSOR) tablet 25 mg  25 mg Oral BID Saundra Shelling, MD   25 mg at 02/20/17 0414  . multivitamin (RENA-VIT) tablet 1 tablet  1 tablet Oral QHS Fritzi Mandes, MD      . pantoprazole (PROTONIX) EC tablet 40 mg  40 mg Oral Daily Wilhelmina Mcardle, MD   40 mg at 02/20/17 0933  . QUEtiapine (SEROQUEL) tablet 200 mg  200 mg Oral BID Wilhelmina Mcardle, MD   200 mg at 02/20/17 0933  . sennosides (SENOKOT) 8.8 MG/5ML syrup 5 mL  5 mL Per Tube BID PRN Tukov, Magadalene S, NP      . sodium chloride flush (NS) 0.9 % injection 10-40 mL  10-40 mL Intracatheter PRN Fritzi Mandes, MD      . valproate (DEPACON) 1,500 mg in dextrose 5 % 50 mL IVPB  1,500 mg Intravenous Q12H Alexis Goodell, MD   Stopped at 02/20/17 1152    Musculoskeletal: Strength & Muscle Tone: decreased Gait &  Station: unable to stand Patient leans: N/A  Psychiatric Specialty Exam: Physical Exam  Nursing note and vitals reviewed. Constitutional: He appears well-developed and well-nourished.  HENT:  Head: Normocephalic and atraumatic.  Eyes: Pupils are equal, round, and reactive to light. Conjunctivae are normal.  Neck: Normal range of motion.  Cardiovascular: Normal heart sounds.   Respiratory: Effort normal.  GI: Soft.  Genitourinary:     Musculoskeletal: Normal range of motion.  Neurological: He is alert. He displays tremor. He exhibits abnormal muscle tone.  Skin: Skin is warm and dry.  Psychiatric: His affect is blunt. His speech is delayed. He is agitated and slowed. Cognition  and memory are impaired. He is noncommunicative.    Review of Systems  Unable to perform ROS: Medical condition    Blood pressure 133/78, pulse (!) 113, temperature 98 F (36.7 C), temperature source Oral, resp. rate 20, height _0  (1.778 m), weight 178 lb 12.8 oz (81.1 kg), SpO2 99 %.Body mass index is 25.66 kg/m.  General Appearance: Disheveled  Eye Contact:  Minimal  Speech:  Garbled  Volume:  Decreased  Mood:  Negative  Affect:  Negative  Thought Process:  NA  Orientation:  Negative  Thought Content:  Negative  Suicidal Thoughts:  No  Homicidal Thoughts:  No  Memory:  Negative  Judgement:  Negative  Insight:  Negative  Psychomotor Activity:  Negative  Concentration:  Concentration: Negative  Recall:  Negative  Fund of Knowledge:  Negative  Language:  Negative  Akathisia:  Negative  Handed:  Right  AIMS (if indicated):     Assets:  Housing  ADL's:  Impaired  Cognition:  Impaired,  Severe  Sleep:        Treatment Plan Summary: Daily contact with patient to assess and evaluate symptoms and progress in treatment, Medication management and Plan Agitation currently under control. When necessary medicines available. No change to current plan for modest doses of Seroquel if possible orally. We'll follow-up regularly.  Disposition: Patient does not meet criteria for psychiatric inpatient admission. Supportive therapy provided about ongoing stressors.  Alethia Berthold, MD 02/20/2017 5:43 PM

## 2017-02-20 NOTE — Progress Notes (Signed)
Dr. Enedina FinnerSona Patel requested patient move to 2A

## 2017-02-20 NOTE — Progress Notes (Signed)
[facility], [place] 02/20/17  Subjective:  Patient resting comfortably in bed. Urine output increased to 2.3 L over the preceding 24 hours. Creatinine still high at 8.0.   Objective:  Vital signs in last 24 hours:  Temp:  [97.5 F (36.4 C)-98.7 F (37.1 C)] 98.6 F (37 C) (07/19 0549) Pulse Rate:  [98-146] 114 (07/19 1134) Resp:  [19-24] 24 (07/19 0549) BP: (134-188)/(83-140) 158/140 (07/19 1134) SpO2:  [99 %-100 %] 100 % (07/19 1130) Weight:  [81.1 kg (178 lb 12.8 oz)] 81.1 kg (178 lb 12.8 oz) (07/19 0549)  Weight change:  Filed Weights   02/16/17 2100 02/18/17 0558 02/20/17 0549  Weight: 84 kg (185 lb 3 oz) 84.1 kg (185 lb 6.5 oz) 81.1 kg (178 lb 12.8 oz)    Intake/Output:    Intake/Output Summary (Last 24 hours) at 02/20/17 1142 Last data filed at 02/20/17 0900  Gross per 24 hour  Intake              399 ml  Output             2400 ml  Net            -2001 ml     Physical Exam: General: No acute distress   HEENT Normocephalic/atraumatic, old mucosal moist   Neck supple  Pulm/lungs Scattered rhonchi, normal respiratory effort   CVS/Heart Regular; no rub  Abdomen:  Soft, distended, Bowel sounds present   Extremities: No edema  Neurologic: Awake, alert, will follow simple commands   Skin: No acute rashes   Foley in place with yellow urine       Basic Metabolic Panel:   Recent Labs Lab 02/16/17 1615 02/16/17 1710 02/16/17 2140 02/16/17 2236 02/17/17 0500 02/19/17 0857 02/20/17 1001  NA 134* 133*  --   --  138 144 150*  K UNABLE TO REPORT DUE TO HEMOLYSIS 7.5* 3.8 3.6 3.5 3.2* 3.1*  CL 102 102  --   --  97* 103 109  CO2 10* 11*  --   --  27 24 24   GLUCOSE 143* 137*  --   --  115* 71 85  BUN 140* 134*  --   --  71* 59* 65*  CREATININE 15.49* 15.14*  --   --  8.39* 7.27* 8.08*  CALCIUM 9.8 9.5  --   --  8.3* 10.2 10.8*  MG  --   --   --   --  1.9  --   --   PHOS  --   --   --   --  6.8*  --   --      CBC:  Recent  Labs Lab 02/16/17 1615 02/17/17 0500 02/20/17 1001  WBC 17.6* 12.2* 20.3*  NEUTROABS 16.7*  --   --   HGB 10.0* 8.0* 8.8*  HCT 29.6* 23.2* 26.5*  MCV 88.8 85.9 87.2  PLT 398 304 306      Lab Results  Component Value Date   HEPBSAG Negative 02/16/2017   HEPBSAB Non Reactive 02/16/2017      Microbiology:  Recent Results (from the past 240 hour(s))  Urine Culture     Status: Abnormal   Collection Time: 02/16/17  4:15 PM  Result Value Ref Range Status   Specimen Description URINE, RANDOM  Final   Special Requests NONE  Final   Culture (A)  Final    >=100,000 COLONIES/mL CITROBACTER KOSERI 50,000 COLONIES/mL PSEUDOMONAS AERUGINOSA    Report Status 02/19/2017 FINAL  Final   Organism ID, Bacteria CITROBACTER KOSERI (A)  Final   Organism ID, Bacteria PSEUDOMONAS AERUGINOSA (A)  Final      Susceptibility   Citrobacter koseri - MIC*    CEFAZOLIN <=4 SENSITIVE Sensitive     CEFTRIAXONE <=1 SENSITIVE Sensitive     CIPROFLOXACIN <=0.25 SENSITIVE Sensitive     GENTAMICIN <=1 SENSITIVE Sensitive     IMIPENEM <=0.25 SENSITIVE Sensitive     NITROFURANTOIN 32 SENSITIVE Sensitive     TRIMETH/SULFA <=20 SENSITIVE Sensitive     PIP/TAZO <=4 SENSITIVE Sensitive     * >=100,000 COLONIES/mL CITROBACTER KOSERI   Pseudomonas aeruginosa - MIC*    CEFTAZIDIME 4 SENSITIVE Sensitive     CIPROFLOXACIN <=0.25 SENSITIVE Sensitive     GENTAMICIN <=1 SENSITIVE Sensitive     IMIPENEM 1 SENSITIVE Sensitive     PIP/TAZO 8 SENSITIVE Sensitive     CEFEPIME 2 SENSITIVE Sensitive     * 50,000 COLONIES/mL PSEUDOMONAS AERUGINOSA  Culture, blood (Routine X 2) w Reflex to ID Panel     Status: None (Preliminary result)   Collection Time: 02/16/17  8:42 PM  Result Value Ref Range Status   Specimen Description BLOOD LEFT ARM  Final   Special Requests   Final    BOTTLES DRAWN AEROBIC AND ANAEROBIC Blood Culture adequate volume   Culture NO GROWTH 4 DAYS  Final   Report Status PENDING  Incomplete   Culture, blood (Routine X 2) w Reflex to ID Panel     Status: None (Preliminary result)   Collection Time: 02/16/17  8:52 PM  Result Value Ref Range Status   Specimen Description BLOOD LEFT HAND  Final   Special Requests   Final    BOTTLES DRAWN AEROBIC AND ANAEROBIC Blood Culture adequate volume   Culture NO GROWTH 4 DAYS  Final   Report Status PENDING  Incomplete  MRSA PCR Screening     Status: None   Collection Time: 02/16/17  9:00 PM  Result Value Ref Range Status   MRSA by PCR NEGATIVE NEGATIVE Final    Comment:        The GeneXpert MRSA Assay (FDA approved for NASAL specimens only), is one component of a comprehensive MRSA colonization surveillance program. It is not intended to diagnose MRSA infection nor to guide or monitor treatment for MRSA infections.     Coagulation Studies: No results for input(s): LABPROT, INR in the last 72 hours.  Urinalysis: No results for input(s): COLORURINE, LABSPEC, PHURINE, GLUCOSEU, HGBUR, BILIRUBINUR, KETONESUR, PROTEINUR, UROBILINOGEN, NITRITE, LEUKOCYTESUR in the last 72 hours.  Invalid input(s): APPERANCEUR    Imaging: No results found.   Medications:   . ceFEPime (MAXIPIME) IV Stopped (02/19/17 2243)  . valproate sodium 1,500 mg (02/20/17 0932)   . amLODipine  5 mg Oral Daily  . bacitracin   Left Eye BID  . chlorhexidine gluconate (MEDLINE KIT)  15 mL Mouth Rinse BID  . fluPHENAZine  5 mg Oral QHS  . heparin  5,000 Units Subcutaneous Q8H  . mouth rinse  15 mL Mouth Rinse QID  . metoprolol tartrate  25 mg Oral BID  . pantoprazole  40 mg Oral Daily  . QUEtiapine  200 mg Oral BID     Assessment/ Plan:  56 y.o. AA male with COPD, HTN, Multinodular goiter, Schizoaffective disorder, CKD st 4, Primary hyperparathyroidism with hypercalcemia admitted for ARF  1. ARF on CKD st 4  Baseline Cr 3.3/GFR 23 from 01/27/2017 Admit Cr 15 ; likely secondary  to underlying volume depletion, sepsis and severe ATN 2.  Hyperkalemia,  improved 3. Acidosis, improved 4. UTI with sepsis 5. Acute resp failure - extubated 02/18/17 6. Recent Urinary retention 7. Anemia of CKD.  8. Hypernatremia.  Plan: BUN and creatinine remain quite high at 65 and 8.0 respectively. However urine output was 2.3 L yesterday. Serum sodium quite high at 150. We will start the patient on D5 W at 75 cc per hour.  Continue to monitor renal parameters daily. We may need to consider restarting dialysis tomorrow if BUN and creatinine continue to trend higher however.  Avoid nephrotoxins as possible.     LOS: [place], Dwanda Tufano 7/19/201811:42 [place], [place]

## 2017-02-20 NOTE — Progress Notes (Signed)
Subjective: Patient following some commands today but remains nonverbal.    Objective: Current vital signs: BP 133/78 (BP Location: Right Arm)   Pulse (!) 113   Temp 98 F (36.7 C) (Oral)   Resp 20   Ht 5' 10" (1.778 m)   Wt 81.1 kg (178 lb 12.8 oz)   SpO2 99%   BMI 25.66 kg/m  Vital signs in last 24 hours: Temp:  [97.5 F (36.4 C)-98.7 F (37.1 C)] 98 F (36.7 C) (07/19 1226) Pulse Rate:  [98-146] 113 (07/19 1226) Resp:  [20-24] 20 (07/19 1226) BP: (133-188)/(78-140) 133/78 (07/19 1226) SpO2:  [99 %-100 %] 99 % (07/19 1226) Weight:  [81.1 kg (178 lb 12.8 oz)] 81.1 kg (178 lb 12.8 oz) (07/19 0549)  Intake/Output from previous day: 07/18 0701 - 07/19 0700 In: 340 [P.O.:240; IV Piggyback:100] Out: 2325 [Urine:2325] Intake/Output this shift: Total I/O In: 59 [P.O.:59] Out: 450 [Urine:450] Nutritional status: DIET - DYS 1 Room service appropriate? Yes with Assist; Fluid consistency: Nectar Thick  Neurologic Exam: Mental Status: Alert. Follows some simple commands. No speech.  Cranial Nerves: II: Discs flat bilaterally; Blinks to bilateral confrontation, pupils equal, round, reactive to light and accommodation III,IV, VI: ptosis not present, extra-ocular motions intact bilaterally V,VII: corneals intactbilaterally VIII: unable to be tested IX,X: gag reflex present XI: bilateral shoulder shrug XII: unable to be tested Motor: Moves all extremities against gravity spontaneously and to command. No focal weakness noted.  Sensory: Responds to noxious stimuli throughout   Lab Results: Basic Metabolic Panel:  Recent Labs Lab 02/16/17 1615 02/16/17 1710 02/16/17 2140 02/16/17 2236 02/17/17 0500 02/19/17 0857 02/20/17 1001  NA 134* 133*  --   --  138 144 150*  K UNABLE TO REPORT DUE TO HEMOLYSIS 7.5* 3.8 3.6 3.5 3.2* 3.1*  CL 102 102  --   --  97* 103 109  CO2 10* 11*  --   --  _0 GLUCOSE 143* 137*  --   --  115* 71 85  BUN 140* 134*  --   --  71*  59* 65*  CREATININE 15.49* 15.14*  --   --  8.39* 7.27* 8.08*  CALCIUM 9.8 9.5  --   --  8.3* 10.2 10.8*  MG  --   --   --   --  1.9  --   --   PHOS  --   --   --   --  6.8*  --   --     Liver Function Tests:  Recent Labs Lab 02/16/17 1615 02/16/17 1710  AST UNABLE TO REPORT DUE TO HEMOLYSIS 28  ALT 18 22  ALKPHOS 114 109  BILITOT 1.5* 1.0  PROT 8.2* 7.9  ALBUMIN 3.1* 3.0*   No results for input(s): LIPASE, AMYLASE in the last 168 hours. No results for input(s): AMMONIA in the last 168 hours.  CBC:  Recent Labs Lab 02/16/17 1615 02/17/17 0500 02/20/17 1001  WBC 17.6* 12.2* 20.3*  NEUTROABS 16.7*  --   --   HGB 10.0* 8.0* 8.8*  HCT 29.6* 23.2* 26.5*  MCV 88.8 85.9 87.2  PLT 398 304 306    Cardiac Enzymes:  Recent Labs Lab 02/16/17 1615  TROPONINI 0.04*    Lipid Panel:  Recent Labs Lab 02/16/17 2051  TRIG 265*    CBG:  Recent Labs Lab 02/17/17 2044 02/18/17 0006 02/18/17 0408 02/18/17 0728 02/18/17 1141  GLUCAP 77 102* 88 95 76    Microbiology: Results for  orders placed or performed during the hospital encounter of 02/16/17  Urine Culture     Status: Abnormal   Collection Time: 02/16/17  4:15 PM  Result Value Ref Range Status   Specimen Description URINE, RANDOM  Final   Special Requests NONE  Final   Culture (A)  Final    >=100,000 COLONIES/mL CITROBACTER KOSERI 50,000 COLONIES/mL PSEUDOMONAS AERUGINOSA    Report Status 02/19/2017 FINAL  Final   Organism ID, Bacteria CITROBACTER KOSERI (A)  Final   Organism ID, Bacteria PSEUDOMONAS AERUGINOSA (A)  Final      Susceptibility   Citrobacter koseri - MIC*    CEFAZOLIN <=4 SENSITIVE Sensitive     CEFTRIAXONE <=1 SENSITIVE Sensitive     CIPROFLOXACIN <=0.25 SENSITIVE Sensitive     GENTAMICIN <=1 SENSITIVE Sensitive     IMIPENEM <=0.25 SENSITIVE Sensitive     NITROFURANTOIN 32 SENSITIVE Sensitive     TRIMETH/SULFA <=20 SENSITIVE Sensitive     PIP/TAZO <=4 SENSITIVE Sensitive     *  >=100,000 COLONIES/mL CITROBACTER KOSERI   Pseudomonas aeruginosa - MIC*    CEFTAZIDIME 4 SENSITIVE Sensitive     CIPROFLOXACIN <=0.25 SENSITIVE Sensitive     GENTAMICIN <=1 SENSITIVE Sensitive     IMIPENEM 1 SENSITIVE Sensitive     PIP/TAZO 8 SENSITIVE Sensitive     CEFEPIME 2 SENSITIVE Sensitive     * 50,000 COLONIES/mL PSEUDOMONAS AERUGINOSA  Culture, blood (Routine X 2) w Reflex to ID Panel     Status: None (Preliminary result)   Collection Time: 02/16/17  8:42 PM  Result Value Ref Range Status   Specimen Description BLOOD LEFT ARM  Final   Special Requests   Final    BOTTLES DRAWN AEROBIC AND ANAEROBIC Blood Culture adequate volume   Culture NO GROWTH 4 DAYS  Final   Report Status PENDING  Incomplete  Culture, blood (Routine X 2) w Reflex to ID Panel     Status: None (Preliminary result)   Collection Time: 02/16/17  8:52 PM  Result Value Ref Range Status   Specimen Description BLOOD LEFT HAND  Final   Special Requests   Final    BOTTLES DRAWN AEROBIC AND ANAEROBIC Blood Culture adequate volume   Culture NO GROWTH 4 DAYS  Final   Report Status PENDING  Incomplete  MRSA PCR Screening     Status: None   Collection Time: 02/16/17  9:00 PM  Result Value Ref Range Status   MRSA by PCR NEGATIVE NEGATIVE Final    Comment:        The GeneXpert MRSA Assay (FDA approved for NASAL specimens only), is one component of a comprehensive MRSA colonization surveillance program. It is not intended to diagnose MRSA infection nor to guide or monitor treatment for MRSA infections.     Coagulation Studies: No results for input(s): LABPROT, INR in the last 72 hours.  Imaging: No results found.  Medications:  I have reviewed the patient's current medications. Scheduled: . bacitracin   Left Eye BID  . chlorhexidine gluconate (MEDLINE KIT)  15 mL Mouth Rinse BID  . diltiazem  30 mg Oral Q6H  . feeding supplement (NEPRO CARB STEADY)  237 mL Oral TID BM  . fluPHENAZine  5 mg Oral QHS   . heparin  5,000 Units Subcutaneous Q8H  . mouth rinse  15 mL Mouth Rinse QID  . metoprolol tartrate  25 mg Oral BID  . multivitamin  1 tablet Oral QHS  . pantoprazole  40 mg Oral Daily  .   QUEtiapine  200 mg Oral BID    Assessment/Plan: Patient tolerating increased dose of Depakote.  Renal function remains significantly impaired.    Recommendations: 1.  Repeat Depakote level in AM. 2.  Continue Depakote at current dose.   3.  Continue seizure precautions   LOS: 4 days   Alexis Goodell, MD Neurology [phone] 02/20/2017  3:12 PM

## 2017-02-20 NOTE — Progress Notes (Signed)
  Speech Language Pathology Treatment: Dysphagia  Patient Details Name: Jason Diaz MRN: [id number] DOB: [birth year] Today's Date: 02/20/2017 Time: [id number] SLP Time Calculation (min) (ACUTE ONLY): 32 min  Assessment / Plan / Recommendation Clinical Impression  Pt continues to appear at increased risk for aspiration secondary to his declined Cognitive status impacting his overall engagement and follow through w/ tasks. Pt is easily distracted especially w/ increased restraints and O2 mask; Sitter is now present in room and is able to better monitor him. After min-mod verbal/tactile/visual cues, pt consumed po trials of Nectar consistency liquids via straw w/ no immediate, overt s/s of aspiration noted; no decline in respiratory status following. Oral phase appeared wfl for bolus management for A-P transfer then oral clearing w/ only min slowness w/ task initially; no residue or anterior spillage occurred. Pt exhibited slow motor responses initially w/ taking po's but then increased responsiveness as trials continued. Pt continues to exhibit the same blowing vocal noises as at evaluation but w/ less lingual protrusion; min discoordination and attention of breathing/swallowing. Suspect this behavior is related to his declined Cognitive status currently. Pt required full feeding assistance. No other trial consistencies were assessed to upgrade diet consistency d/t pt's Cognitive decline. Recommend continue a modified diet at this time for Safety of oral intake and feeding assistance; trials to upgrade as appropriate as pt's Cognitive status improves. Later discussed w/ Sitter/NSG staff how to reduce distractions in environment to reduce pt's fidgityness and confusion during oral intake.    HPI HPI: Pt  is a 56 y.o. male with a known history of COPD not on oxygen, hypertension, multinodular goiter, schizoaffective disorder, CK D stage IV, GERD who lives in a group home was sent to the hospital referred by  his nephrologist. acute renal failure-on CKD stage IV versus progression of CKD. Pt is nonverbal and not following any commands consistently; confusion and poor engagement.       SLP Plan  Continue with current plan of care       Recommendations  Diet recommendations: Dysphagia 1 (puree);Nectar-thick liquid Liquids provided via: Cup;Straw Medication Administration: Crushed with puree Supervision: Staff to assist with self feeding;Full supervision/cueing for compensatory strategies Compensations: Minimize environmental distractions;Slow rate;Small sips/bites;Lingual sweep for clearance of pocketing;Multiple dry swallows after each bite/sip;Follow solids with liquid Postural Changes and/or Swallow Maneuvers: Seated upright 90 degrees;Upright 30-60 min after meal                General recommendations:  (Dietician f/u) Oral Care Recommendations: Oral care BID;Staff/trained caregiver to provide oral care Follow up Recommendations: Skilled Nursing facility SLP Visit Diagnosis: Dysphagia, oropharyngeal phase (R13.12) Plan: Continue with current plan of care       GO               Jerilynn SomKatherine Xeng Kucher, MS, CCC-SLP Suzzanne Brunkhorst 02/20/2017, 3:02 PM

## 2017-02-20 NOTE — Progress Notes (Signed)
Nutrition Follow Up Note   DOCUMENTATION CODES:   Not applicable  INTERVENTION:   Nepro Shake po TID, each supplement provides 425 kcal and 19 grams protein  Magic cup TID with meals, each supplement provides 290 kcal and 9 grams of protein  Renal MVI  NUTRITION DIAGNOSIS:   Inadequate oral intake related to  (altered mental status) as evidenced by meal completion < 50%.  GOAL:   Patient will meet greater than or equal to 90% of their needs  MONITOR:   PO intake, Supplement acceptance, Labs, Weight trends, I & O's  ASSESSMENT:   Patient with PMH of CKD stg IV, baseline creatinine 3.4, GERD, Hypertension, slipped and fell on his urine, possible seizure, presented with acute metabolic encephelopathy secondary to UTI and septic shock, acute on chronic renal failure, metabolic acidosis requiring emergent HD   Unable to communicate with pt; pt with acute delirium/agitation. Pt extubated 7/17. Pt was seen by SLP on 7/18 and received a CSE; approved for DYS 1/NCT diet. Pt eating 50% of meals today. RD will order supplements. HD initiated 7/16; currently holding off but may restart tomorrow per MD note. Per chart, pt appears weight stable since admit. Please continue to encourage intake of meals and supplements.  Monitor and supplement K, P, and Mg as needed per MD discretion. Pt with elevated sodium; initiated on IVFs. Pt noted to have possibly seizures; neurology consulted.  Medications reviewed and include: heparin, protonix, cefepime, Dextrose w/ KCl  Labs reviewed: Na 150(H), K 3.1(L), BUN 65(H), creat 8.08(H), Ca 10.8(H) P 6.8(H), Mg 1.9 wnl- 7/16 Wbc- 20.3(H), Hgb 8.8(L), Hct 26.5(L)  Diet Order:  DIET - DYS 1 Room service appropriate? Yes with Assist; Fluid consistency: Nectar Thick  Skin:  Reviewed, no issues  Last BM:  7/18  Height:   Ht Readings from Last 1 Encounters:  02/16/17 5\' 10"  (1.778 m)    Weight:   Wt Readings from Last 1 Encounters:  02/20/17 178 lb  12.8 oz (81.1 kg)    Ideal Body Weight:  75.45 kg  BMI:  Body mass index is 25.66 kg/m.  Estimated Nutritional Needs:   Kcal:  2200-2500kcak/day   Protein:  105-121g/day   Fluid:  per MD  EDUCATION NEEDS:   Education needs no appropriate at this time  Betsey Holidayasey Roel Douthat MS, RD, LDN Pager #[phone]- [phone] After Hours Pager: [phone]

## 2017-02-20 NOTE — Progress Notes (Signed)
[facility] -  at [facility]   PATIENT NAME: Jason Diaz    MR#:  [id number]  DATE OF BIRTH:  11/12/1960  SUBJECTIVE:  Awake But noncommunicative  Per RN gets agitated Sitter in the room Ate 50% of his BF  REVIEW OF SYSTEMS:   Review of Systems  Constitutional: Negative for chills, fever and weight loss.  HENT: Negative for ear discharge, ear pain and nosebleeds.   Eyes: Negative for blurred vision, pain and discharge.  Respiratory: Negative for sputum production, shortness of breath, wheezing and stridor.   Cardiovascular: Negative for chest pain, palpitations, orthopnea and PND.  Gastrointestinal: Negative for abdominal pain, diarrhea, nausea and vomiting.  Genitourinary: Negative for frequency and urgency.  Musculoskeletal: Negative for back pain and joint pain.  Neurological: Positive for weakness. Negative for sensory change, speech change and focal weakness.  Psychiatric/Behavioral: Negative for depression and hallucinations. The patient is not nervous/anxious.    Tolerating Diet: renal diet Tolerating PT: pending  DRUG ALLERGIES:  No Known Allergies  VITALS:  Blood pressure 133/78, pulse (!) 113, temperature 98.6 F (37 C), resp. rate (!) 24, height 5\' 10"  (1.778 m), weight 81.1 kg (178 lb 12.8 oz), SpO2 99 %.  PHYSICAL EXAMINATION:   Physical Exam  GENERAL:  56 y.o.-year-old patient lying in the bed with no acute distress. Chronically ill EYES: Pupils equal, round, reactive to light and accommodation. No scleral icterus. Extraocular muscles intact.  HEENT: Head atraumatic, normocephalic. Oropharynx and nasopharynx clear. NECK:  Supple, no jugular venous distention. No thyroid enlargement, no tenderness.  LUNGS: Normal breath sounds bilaterally, no wheezing, rales, rhonchi. No use of accessory muscles of respiration.  CARDIOVASCULAR: S1, S2 normal. No murmurs, rubs, or gallops.  ABDOMEN: Soft, nontender, nondistended. Bowel sounds  present. No organomegaly or mass.  EXTREMITIES: No cyanosis, clubbing or edema b/l.    NEUROLOGIC:moves extremities well spontaneously PSYCHIATRIC: agitation, does not communicate SKIN: No obvious rash, lesion, or ulcer.   LABORATORY PANEL:  CBC  Recent Labs Lab 02/20/17 1001  WBC 20.3*  HGB 8.8*  HCT 26.5*  PLT 306    Chemistries   Recent Labs Lab 02/16/17 1710  02/17/17 0500  02/20/17 1001  NA 133*  --  138  < > 150*  K 7.5*  < > 3.5  < > 3.1*  CL 102  --  97*  < > 109  CO2 11*  --  27  < > 24  GLUCOSE 137*  --  115*  < > 85  BUN 134*  --  71*  < > 65*  CREATININE 15.14*  --  8.39*  < > 8.08*  CALCIUM 9.5  --  8.3*  < > 10.8*  MG  --   --  1.9  --   --   AST 28  --   --   --   --   ALT 22  --   --   --   --   ALKPHOS 109  --   --   --   --   BILITOT 1.0  --   --   --   --   < > = values in this interval not displayed. Cardiac Enzymes  Recent Labs Lab 02/16/17 1615  TROPONINI 0.04*   RADIOLOGY:  No results found. ASSESSMENT AND PLAN:  Pastor Sgro  is a 56 y.o. male with a known history of Chronic kidney disease stage IV baseline creatinine around 3.4, GERD, schizoaffective disorder., Hypertension comes to  the emergency room from [facility] after patient slipping in his urine causing him to fall and hitting his head on the nightstand. Staff noted shaking questionable seizure received 4 mg of Versed. Patient was quite confused came to the emergency room and was found to be in septic shock. His white count was elevated to 17,000 was tachycardic and creatinine was 15.4 with potassium of 7.5.  1. Acute metabolic encephalopathy multifactorial secondary to septic shock, acute on chronic severe renal failure with dehydration -Patient was intubated placed on the ventilator---extubated 02/18/17 -Continue to monitor for seizure activity. -Appreciate neurology consultation. Patient on IV Depakote---can we change to oral depakote  2. Septic shock source  appears urine -Patient was discharged 01/28/2017 with an indwelling Foley catheter given significant hematuria and difficulty urination. He was supposed to follow with urology not sure if he made to the appointment -IV cefepime ---change to oral abx from tomorrow - urine culture growing more than 100,000 citrobacter and pseudomonas -blood culture--->  negative.  3. Acute on chronic kidney failure stage IV/5 -Baseline creatinine 3.35------ came in with creatinine of 15.49--- continue hemodialysis---7.27-- 8.08 -Nephrology consultation appreciated. -intermittent HD  4. Acute severe hyperkalemia -Patient received IV bicarbonate, dextrose with insulin and calcium gluconate -Follow up potassium with dialysis -Came in with potassium of 7.5----3.5  5. Leukocytosis due to #2  6. DVT prophylaxis subcutaneous heparin  7. Possible seizures versus jerky/tremor movements and to uremic encephalopathy -Empirically on IV depakote -Appreciate neurology input. -EEG done and showing triphasic activity waveforms---cont anti seizure meds  8. Acute delirium/agitation with history of schizoaffective disorder -We'll get psychiatry consultation   Case discussed with Care Management/Social Worker. Management plans discussed with the patient, family and they are in agreement.  CODE STATUS: full  DVT Prophylaxis: heparin  TOTAL TIME TAKING CARE OF THIS PATIENT: *30* minutes.  >50% time spent on counselling and coordination of care    Note: This dictation was prepared with Dragon dictation along with smaller phrase technology. Any transcriptional errors that result from this process are unintentional.  Sonia Bromell M.D on 02/20/2017 at 2:22 PM  Between 7am to 6pm - Pager - [phone]  After 6pm go to [email or web address] - Social research officer, governmentpassword [email or web address]  [facility]  Office  [phone]  CC: Primary care physician; Armando GangLindley, Cheryl P, FNP

## 2017-02-20 NOTE — Progress Notes (Signed)
Patient HR has been elevated and sustained in 140's  since16:00 MD was notified and order to transfer received. Although,upon transferred patient HR down to 117 but if patient moves or being turned in bed, HR shoots up to 140's. Report given to Simon,RN in 2A.

## 2017-02-21 ENCOUNTER — Inpatient Hospital Stay: Payer: Medicare Other

## 2017-02-21 DIAGNOSIS — Z515 Encounter for palliative care: Secondary | ICD-10-CM

## 2017-02-21 DIAGNOSIS — N179 Acute kidney failure, unspecified: Secondary | ICD-10-CM

## 2017-02-21 DIAGNOSIS — N189 Chronic kidney disease, unspecified: Secondary | ICD-10-CM

## 2017-02-21 DIAGNOSIS — Z7189 Other specified counseling: Secondary | ICD-10-CM

## 2017-02-21 DIAGNOSIS — F259 Schizoaffective disorder, unspecified: Secondary | ICD-10-CM

## 2017-02-21 LAB — CULTURE, BLOOD (ROUTINE X 2)
CULTURE: NO GROWTH
Culture: NO GROWTH
SPECIAL REQUESTS: ADEQUATE
Special Requests: ADEQUATE

## 2017-02-21 LAB — BASIC METABOLIC PANEL
ANION GAP: 9 (ref 5–15)
BUN: 52 mg/dL — AB (ref 6–20)
CHLORIDE: 109 mmol/L (ref 101–111)
CO2: 28 mmol/L (ref 22–32)
Calcium: 9.8 mg/dL (ref 8.9–10.3)
Creatinine, Ser: 5.85 mg/dL — ABNORMAL HIGH (ref 0.61–1.24)
GFR calc Af Amer: 12 mL/min — ABNORMAL LOW (ref >60)
GFR calc non Af Amer: 10 mL/min — ABNORMAL LOW (ref >60)
GLUCOSE: 109 mg/dL — AB (ref 65–99)
POTASSIUM: 2.7 mmol/L — AB (ref 3.5–5.1)
Sodium: 146 mmol/L — ABNORMAL HIGH (ref 135–145)

## 2017-02-21 LAB — VALPROIC ACID LEVEL: Valproic Acid Lvl: 83 ug/mL (ref 50.0–100.0)

## 2017-02-21 LAB — PHOSPHORUS: Phosphorus: 4.1 mg/dL (ref 2.5–4.6)

## 2017-02-21 MED ORDER — POTASSIUM CHLORIDE 10 MEQ/100ML IV SOLN
10.0000 meq | INTRAVENOUS | Status: AC
  Administered 2017-02-21 – 2017-02-22 (×4): 10 meq via INTRAVENOUS
  Filled 2017-02-21 (×4): qty 100

## 2017-02-21 MED ORDER — LORAZEPAM 2 MG/ML IJ SOLN
2.0000 mg | Freq: Four times a day (QID) | INTRAMUSCULAR | Status: DC | PRN
  Administered 2017-02-21 – 2017-02-24 (×6): 2 mg via INTRAVENOUS
  Filled 2017-02-21 (×6): qty 1

## 2017-02-21 MED ORDER — TUBERCULIN PPD 5 UNIT/0.1ML ID SOLN
5.0000 [IU] | Freq: Once | INTRADERMAL | Status: AC
  Administered 2017-02-21: 5 [IU] via INTRADERMAL
  Filled 2017-02-21: qty 0.1

## 2017-02-21 MED ORDER — TUBERCULIN PPD 5 UNIT/0.1ML ID SOLN
5.0000 [IU] | Freq: Once | INTRADERMAL | Status: DC
  Filled 2017-02-21: qty 0.1

## 2017-02-21 NOTE — Progress Notes (Addendum)
Subjective: Patient lethargic.  Unable to be awakened.  Received some Ativan this morning and had Trazodone last evening but otherwise no sedating medications.  Scheduled for dialysis today.    Objective: Current vital signs: BP 140/78 (BP Location: Right Arm)   Pulse 100   Temp 98 F (36.7 C) (Oral)   Resp 18   Ht 5' 10"  (1.778 m)   Wt 80.7 kg (178 lb)   SpO2 99%   BMI 25.54 kg/m  Vital signs in last 24 hours: Temp:  [97.8 F (36.6 C)-98.5 F (36.9 C)] 98 F (36.7 C) (07/20 0757) Pulse Rate:  [100-114] 100 (07/20 0757) Resp:  [16-18] 18 (07/20 0757) BP: (140-149)/(78-83) 140/78 (07/20 0757) SpO2:  [96 %-100 %] 99 % (07/20 0757) Weight:  [80.7 kg (178 lb)] 80.7 kg (178 lb) (07/19 1909)  Intake/Output from previous day: 07/19 0701 - 07/20 0700 In: 768 [P.O.:118; I.V.:600; IV Piggyback:50] Out: 1101 [Urine:1100; Stool:1] Intake/Output this shift: Total I/O In: -  Out: 600 [Urine:600] Nutritional status: DIET - DYS 1 Room service appropriate? Yes with Assist; Fluid consistency: Nectar Thick  Neurologic Exam: Mental Status: Lethargic.  Does some grunting with deep sternal rib but no other response.  Does not follow commands.  No speech. Cranial Nerves: II: Pupils equal, round, reactive to light and accommodation III,IV, VI: Intact oculocephalic maneuvers V,VII: corneals intactbilaterally VIII: unable to be tested IX,X: gag reflex present XI: unable to be tested XII: unable to be tested Motor: Moves left side more than right with some localization to pain with the left upper extremity.  Sensory: Responds to noxious stimuli throughout  Lab Results: Basic Metabolic Panel:  Recent Labs Lab 02/16/17 1615 02/16/17 1710 02/16/17 2140 02/16/17 2236 02/17/17 0500 02/19/17 0857 02/20/17 1001  NA 134* 133*  --   --  138 144 150*  K UNABLE TO REPORT DUE TO HEMOLYSIS 7.5* 3.8 3.6 3.5 3.2* 3.1*  CL 102 102  --   --  97* 103 109  CO2 10* 11*  --   --  27 24 24    GLUCOSE 143* 137*  --   --  115* 71 85  BUN 140* 134*  --   --  71* 59* 65*  CREATININE 15.49* 15.14*  --   --  8.39* 7.27* 8.08*  CALCIUM 9.8 9.5  --   --  8.3* 10.2 10.8*  MG  --   --   --   --  1.9  --   --   PHOS  --   --   --   --  6.8*  --   --     Liver Function Tests:  Recent Labs Lab 02/16/17 1615 02/16/17 1710  AST UNABLE TO REPORT DUE TO HEMOLYSIS 28  ALT 18 22  ALKPHOS 114 109  BILITOT 1.5* 1.0  PROT 8.2* 7.9  ALBUMIN 3.1* 3.0*   No results for input(s): LIPASE, AMYLASE in the last 168 hours. No results for input(s): AMMONIA in the last 168 hours.  CBC:  Recent Labs Lab 02/16/17 1615 02/17/17 0500 02/20/17 1001  WBC 17.6* 12.2* 20.3*  NEUTROABS 16.7*  --   --   HGB 10.0* 8.0* 8.8*  HCT 29.6* 23.2* 26.5*  MCV 88.8 85.9 87.2  PLT 398 304 306    Cardiac Enzymes:  Recent Labs Lab 02/16/17 1615  TROPONINI 0.04*    Lipid Panel:  Recent Labs Lab 02/16/17 2051  TRIG 265*    CBG:  Recent Labs Lab 02/17/17 2044 02/18/17  0006 02/18/17 0408 02/18/17 0728 02/18/17 1141  GLUCAP 77 102* 88 95 76    Microbiology: Results for orders placed or performed during the hospital encounter of 02/16/17  Urine Culture     Status: Abnormal   Collection Time: 02/16/17  4:15 PM  Result Value Ref Range Status   Specimen Description URINE, RANDOM  Final   Special Requests NONE  Final   Culture (A)  Final    >=100,000 COLONIES/mL CITROBACTER KOSERI 50,000 COLONIES/mL PSEUDOMONAS AERUGINOSA    Report Status 02/19/2017 FINAL  Final   Organism ID, Bacteria CITROBACTER KOSERI (A)  Final   Organism ID, Bacteria PSEUDOMONAS AERUGINOSA (A)  Final      Susceptibility   Citrobacter koseri - MIC*    CEFAZOLIN <=4 SENSITIVE Sensitive     CEFTRIAXONE <=1 SENSITIVE Sensitive     CIPROFLOXACIN <=0.25 SENSITIVE Sensitive     GENTAMICIN <=1 SENSITIVE Sensitive     IMIPENEM <=0.25 SENSITIVE Sensitive     NITROFURANTOIN 32 SENSITIVE Sensitive     TRIMETH/SULFA <=20  SENSITIVE Sensitive     PIP/TAZO <=4 SENSITIVE Sensitive     * >=100,000 COLONIES/mL CITROBACTER KOSERI   Pseudomonas aeruginosa - MIC*    CEFTAZIDIME 4 SENSITIVE Sensitive     CIPROFLOXACIN <=0.25 SENSITIVE Sensitive     GENTAMICIN <=1 SENSITIVE Sensitive     IMIPENEM 1 SENSITIVE Sensitive     PIP/TAZO 8 SENSITIVE Sensitive     CEFEPIME 2 SENSITIVE Sensitive     * 50,000 COLONIES/mL PSEUDOMONAS AERUGINOSA  Culture, blood (Routine X 2) w Reflex to ID Panel     Status: None   Collection Time: 02/16/17  8:42 PM  Result Value Ref Range Status   Specimen Description BLOOD LEFT ARM  Final   Special Requests   Final    BOTTLES DRAWN AEROBIC AND ANAEROBIC Blood Culture adequate volume   Culture NO GROWTH 5 DAYS  Final   Report Status 02/21/2017 FINAL  Final  Culture, blood (Routine X 2) w Reflex to ID Panel     Status: None   Collection Time: 02/16/17  8:52 PM  Result Value Ref Range Status   Specimen Description BLOOD LEFT HAND  Final   Special Requests   Final    BOTTLES DRAWN AEROBIC AND ANAEROBIC Blood Culture adequate volume   Culture NO GROWTH 5 DAYS  Final   Report Status 02/21/2017 FINAL  Final  MRSA PCR Screening     Status: None   Collection Time: 02/16/17  9:00 PM  Result Value Ref Range Status   MRSA by PCR NEGATIVE NEGATIVE Final    Comment:        The GeneXpert MRSA Assay (FDA approved for NASAL specimens only), is one component of a comprehensive MRSA colonization surveillance program. It is not intended to diagnose MRSA infection nor to guide or monitor treatment for MRSA infections.     Coagulation Studies: No results for input(s): LABPROT, INR in the last 72 hours.  Imaging: No results found.  Medications:  I have reviewed the patient's current medications. Scheduled: . bacitracin   Left Eye BID  . chlorhexidine gluconate (MEDLINE KIT)  15 mL Mouth Rinse BID  . diltiazem  30 mg Oral Q6H  . feeding supplement (NEPRO CARB STEADY)  237 mL Oral TID BM   . fluPHENAZine  5 mg Oral QHS  . heparin  5,000 Units Subcutaneous Q8H  . mouth rinse  15 mL Mouth Rinse QID  . metoprolol tartrate  25 mg  Oral BID  . multivitamin  1 tablet Oral QHS  . pantoprazole  40 mg Oral Daily  . QUEtiapine  200 mg Oral BID  . traZODone  50 mg Oral QHS    Assessment/Plan: Patient obtunded today.  Unclear etiology although may be related to metabolic abnormalities from poor renal function.  Some minor focality noted on neurological examination and remains nonverbal.   Remains on Depakote with therapeutic level at 83.  Recommendations: 1.  Continue Depakote at current dose.  May change to po at current dose once patient able to take po. 2.  Depakote level in AM 3.  Head CT without contrast STAT   LOS: 5 days   Alexis Goodell, MD Neurology [phone] 02/21/2017  1:27 PM

## 2017-02-21 NOTE — Progress Notes (Signed)
HD STARTED  

## 2017-02-21 NOTE — Progress Notes (Signed)
[facility] [place] [facility]   PATIENT NAME: Jason Diaz    MR#:  [id number]  DATE OF BIRTH:  04-23-1961  SUBJECTIVE:  \ noncommunicative Resting quietly. Ate 50% of his BF Heart rate goes up with activity  REVIEW OF SYSTEMS:   Review of Systems  Constitutional: Negative for chills, fever and weight loss.  HENT: Negative for ear discharge, ear pain and nosebleeds.   Eyes: Negative for blurred vision, pain and discharge.  Respiratory: Negative for sputum production, shortness of breath, wheezing and stridor.   Cardiovascular: Negative for chest pain, palpitations, orthopnea and PND.  Gastrointestinal: Negative for abdominal pain, diarrhea, nausea and vomiting.  Genitourinary: Negative for frequency and urgency.  Musculoskeletal: Negative for back pain and joint pain.  Neurological: Positive for weakness. Negative for sensory change, speech change and focal weakness.  Psychiatric/Behavioral: Negative for depression and hallucinations. The patient is not nervous/anxious.    Tolerating Diet: renal diet Tolerating PT: pending  DRUG ALLERGIES:  No Known Allergies  VITALS:  Blood pressure 140/78, pulse 100, temperature 98 F (36.7 C), temperature source Oral, resp. rate 18, height 5\' 10"  (1.778 m), weight 80.7 kg (178 lb), SpO2 99 %.  PHYSICAL EXAMINATION:   Physical Exam  GENERAL:  56 y.o.-year-old patient lying in the bed with no acute distress. Chronically ill EYES: Pupils equal, round, reactive to light and accommodation. No scleral icterus. Extraocular muscles intact.  HEENT: Head atraumatic, normocephalic. Oropharynx and nasopharynx clear. NECK:  Supple, no jugular venous distention. No thyroid enlargement, no tenderness.  LUNGS: Normal breath sounds bilaterally, no wheezing, rales, rhonchi. No use of accessory muscles of respiration.  CARDIOVASCULAR: S1, S2 normal. No murmurs, rubs, or gallops.  ABDOMEN: Soft, nontender, nondistended.  Bowel sounds present. No organomegaly or mass.  EXTREMITIES: No cyanosis, clubbing or edema b/l.    NEUROLOGIC:moves extremities well spontaneously PSYCHIATRIC: agitation, does not communicate SKIN: No obvious rash, lesion, or ulcer.   LABORATORY PANEL:  CBC  Recent Labs Lab 02/20/17 1001  WBC 20.3*  HGB 8.8*  HCT 26.5*  PLT 306    Chemistries   Recent Labs Lab 02/16/17 1710  02/17/17 0500  02/20/17 1001  NA 133*  --  138  < > 150*  K 7.5*  < > 3.5  < > 3.1*  CL 102  --  97*  < > 109  CO2 11*  --  27  < > 24  GLUCOSE 137*  --  115*  < > 85  BUN 134*  --  71*  < > 65*  CREATININE 15.14*  --  8.39*  < > 8.08*  CALCIUM 9.5  --  8.3*  < > 10.8*  MG  --   --  1.9  --   --   AST 28  --   --   --   --   ALT 22  --   --   --   --   ALKPHOS 109  --   --   --   --   BILITOT 1.0  --   --   --   --   < > = values in this interval not displayed. Cardiac Enzymes  Recent Labs Lab 02/16/17 1615  TROPONINI 0.04*   RADIOLOGY:  No results found. ASSESSMENT AND PLAN:  Jason Diaz  is a 56 y.o. male with a known history of Chronic kidney disease stage IV baseline creatinine around 3.4, GERD, schizoaffective disorder., Hypertension comes to the emergency room  from [facility] after patient slipping in his urine causing him to fall and hitting his head on the nightstand. Staff noted shaking questionable seizure received 4 mg of Versed. Patient was quite confused came to the emergency room and was found to be in septic shock. His white count was elevated to 17,000 was tachycardic and creatinine was 15.4 with potassium of 7.5.  1. Acute metabolic encephalopathy multifactorial secondary to septic shock, acute on chronic severe renal failure with dehydration -Patient was intubated placed on the ventilator---extubated 02/18/17 -Continue to monitor for seizure activity. -Appreciate neurology consultation. Patient on IV Depakote---can we change to oral depakote  2. Septic shock  source appears urine -Patient was discharged 01/28/2017 with an indwelling Foley catheter given significant hematuria and difficulty urination. He was supposed to follow with urology not sure if he made to the appointment -IV cefepime.Unable to change to oral meds since patient is noncompliant with oral meds. Will continue IV antibiotics - urine culture growing more than 100,000 citrobacter and pseudomonas -blood culture--->  negative.  3. Acute on chronic kidney failure stage IV/5 -Baseline creatinine 3.35------ came in with creatinine of 15.49--- continue hemodialysis---7.27-- 8.08 -Nephrology consultation appreciated. -patient to be dialyzed today.  4. Acute severe hyperkalemia -Patient received IV bicarbonate, dextrose with insulin and calcium gluconate -Follow up potassium with dialysis -Came in with potassium of 7.5----3.5  5. Leukocytosis due to #2  6. DVT prophylaxis subcutaneous heparin  7. Possible seizures versus jerky/tremor movements and to uremic encephalopathy -Empirically on IV depakote -Appreciate neurology input. -EEG done and showing triphasic activity waveforms---cont anti seizure meds  8. Acute delirium/agitation with history of schizoaffective disorder -psychiatry consultation noted. No new recommendations noted.  Patient has been having periods of agitation and restlessness  And being noncompliant with his meds. Palliative consultation placed. I have not seen any family members come by.  Social worker for discharge planning  Case discussed with Care Management/Social Worker. Management plans discussed with the patient, family and they are in agreement.  CODE STATUS: full  DVT Prophylaxis: heparin  TOTAL TIME TAKING CARE OF THIS PATIENT: *30* minutes.  >50% time spent on counselling and coordination of care    Note: This dictation was prepared with Dragon dictation along with smaller phrase technology. Any transcriptional errors that result from  this process are unintentional.  Kijuan Gallicchio M.D on 02/21/2017 at 11:42 AM  Between 7am to 6pm - Pager - [phone]  After 6pm go to [email or web address] - Social research officer, government  [facility]  Office  [phone]  CC: Primary care physician; Armando Gang, FNP

## 2017-02-21 NOTE — Progress Notes (Signed)
SLP Cancellation Note  Patient Details Name: Jason Diaz MRN: [id number] DOB: 1961/07/22   Cancelled treatment:       Reason Eval/Treat Not Completed: Fatigue/lethargy limiting ability to participate (chart reviewed; consulted NSG re: pt's status today)  Pt currently lethargic, drowsy per NSG. NSG also stated pt has only taken bites/sips, but no overt s/s of aspiration were reported w/ the oral intake.  Due to pt's decreased alertness and (baseline) declined Cognitive status since evaluation, recommend pt remain on current dysphagia diet w/ aspiration precautions d/t increased risk for aspiration w/ oral intake. ST services will f/u w/ pt's status and ability to safely upgrade diet consistency when appropriate. NSG agreed. Aspiration precautions posted in room.    Jerilynn SomKatherine Watson, MS, CCC-SLP Watson,Katherine 02/21/2017, 1:45 PM

## 2017-02-21 NOTE — Consult Note (Signed)
Consultation Note Date: 02/21/2017   Patient Name: Jason Diaz  DOB: 02-01-1961  MRN: [id number]  Age / Sex: 56 y.o., male  PCP: Remi Haggard, FNP Referring Physician: Fritzi Mandes, MD  Reason for Consultation: Establishing goals of care  HPI/Patient Profile: 56 y.o. male  with past medical history of schizoaffective disorder, hypertension, GERD, COPD, CKD IV admitted on 02/16/2017 with unresponsiveness from nursing facility. Patient slipped in his urine causing him to fall and hit his head on nightstand. Per staff, shaking/possible seizure. Received 6m Versed. In ED, patient in septic shock with WBC 17,000, creatinine 15.4, and potassium 7.5. Patient was intubated/mechanically ventilated and CRRT initiated. Extubated 7/17 and stable for transfer to floor. Nephrology following-monitoring kidney functions. Will likely need further dialysis. Neurology following and receiving Depakote. Psych also following for history of schizoaffective disorder. Palliative medicine consultation for goals of care.   Clinical Assessment and Goals of Care: I have reviewed medical records and discussed with care team. Spoke with mother, Jason Diaz via telephone.   Introduced Palliative Medicine as specialized medical care for people living with serious illness. It focuses on providing relief from the symptoms and stress of a serious illness. The goal is to improve quality of life for both the patient and the family.  We discussed a brief life review of the patient. Jason Hodgkinsshares with me that is schizophrenia began when he was in college. He has been in a group home ([facility] for "many years." Baseline, he will talk but "very reserved" and typically will only talk to people he knows. 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    Discussed hospital diagnoses and interventions. Jason Diaz finds hope in the fact that he was weaned off of the ventilator and out of ICU. Also that he is producing urine. Discussed concern with increased lethargy and refusing to take pills this morning. Plan for HD per nephrology today.   Advanced directives, concepts specific to code status, and artifical feeding and hydration were considered and discussed. She is interested in completing advanced directive packet. Explained that this could only be done if he was awake, alert, oriented and able to participate in the conversation. Not of capacity to do so at this time. Jason Hodgkinsunderstands she is faced with big decisions in the future regarding long-term HD, code status, and goals moving forward.   Reassured continued support from palliative throughout hospitalization. Encouraged her to consider what quality of life looks like for him.    SUMMARY OF RECOMMENDATIONS    FULL code/FULL scope  Brief GOC with mother via telephone. Will [place] as hospitalization progresses.   [facility]   Code Status/Advance Care Planning:  Full code  Symptom Management:   Per attending  Palliative Prophylaxis:   Aspiration, Bowel Regimen, Delirium Protocol, Oral Care and Turn Reposition  Additional Recommendations (Limitations, Scope, Preferences):  Full Scope Treatment  Psycho-social/Spiritual:   Desire for further Chaplaincy support:  yes  Additional Recommendations: Caregiving  Support/Resources, Compassionate Wean Education and Education on Hospice  Prognosis:   Unable to determine  Discharge Planning: To Be Determined      Primary Diagnoses: Present on Admission: . Septic shock ([place]) . Schizoaffective disorder ([place])   I have reviewed the medical record, interviewed the patient and family, and examined the patient. The following aspects are pertinent.  Past Medical History:  Diagnosis Date  . CKD (chronic kidney disease),  stage IV ([place])   . COPD (chronic obstructive pulmonary disease) ([place])   . GERD (gastroesophageal reflux disease)   . Hypertension   . Multinodular goiter   . Schizoaffective disorder [facility])    Social History   Social History  . Marital status: Single    Spouse name: N/A  . Number of children: N/A  . Years of education: N/A   Social History Main Topics  . Smoking status: Current Every Day Smoker    Packs/day: 0.25    Types: Cigarettes  . Smokeless tobacco: Never Used  . Alcohol use No  . Drug use: No  . Sexual activity: Not Asked   Other Topics Concern  . None   Social History Narrative   From [facility] group home   Family History  Problem Relation Age of Onset  . Kidney disease Father   . Diabetes Father    Scheduled Meds: . bacitracin   Left Eye BID  . chlorhexidine gluconate (MEDLINE KIT)  15 mL Mouth Rinse BID  . diltiazem  30 mg Oral Q6H  . feeding supplement (NEPRO CARB STEADY)  237 mL Oral TID BM  . fluPHENAZine  5 mg Oral QHS  . heparin  5,000 Units Subcutaneous Q8H  . mouth rinse  15 mL Mouth Rinse QID  . metoprolol tartrate  25 mg Oral BID  . multivitamin  1 tablet Oral QHS  . pantoprazole  40 mg Oral Daily  . QUEtiapine  200 mg Oral BID  . traZODone  50 mg Oral QHS   Continuous Infusions: . ceFEPime (MAXIPIME) IV Stopped (02/20/17 1959)  . dextrose 5 % with KCl 20 mEq / L 20 mEq (02/21/17 0921)  . valproate sodium Stopped (02/20/17 2255)   PRN Meds:.acetaminophen **OR** acetaminophen, bisacodyl, budesonide (PULMICORT) nebulizer solution, hydrALAZINE, ipratropium-albuterol, LORazepam, metoprolol tartrate, sennosides, sodium chloride flush Medications Prior to Admission:  Prior to Admission medications   Medication Sig Start Date End Date Taking? Authorizing Provider  amLODipine (NORVASC) 5 MG tablet Take 5 mg by mouth daily. 01/03/17  Yes [provider]  Cholecalciferol (VITAMIN D3) 5000 units TABS Take 5,000 Units by mouth daily.   Yes  [provider]  ezetimibe (ZETIA) 10 MG tablet Take 10 mg by mouth daily. 01/03/17  Yes [provider]  fluPHENAZine (PROLIXIN) 5 MG tablet Take 5 mg by mouth at bedtime as needed.  01/03/17  Yes [provider]  furosemide (LASIX) 40 MG tablet Take 40 mg by mouth daily. 01/03/17  Yes [provider]  gemfibrozil (LOPID) 600 MG tablet Take 600 mg by mouth 2 (two) times daily. 01/03/17  Yes [provider]  metoprolol succinate (TOPROL-XL) 100 MG 24 hr tablet Take 100 mg by mouth daily. 01/03/17  Yes [provider]  pantoprazole (PROTONIX) 40 MG tablet Take 40 mg by mouth daily. 01/03/17  Yes [provider]  polyethylene glycol (MIRALAX / GLYCOLAX) packet Take 17 g by mouth daily. 01/28/17  Yes Theodoro Grist, MD  potassium chloride (K-DUR) 10 MEQ tablet  Take 10 mEq by mouth daily. 01/03/17  Yes [provider]  pravastatin (PRAVACHOL) 40 MG tablet Take 40 mg by mouth daily. 01/03/17  Yes [provider]  QUEtiapine (SEROQUEL) 400 MG tablet Take 200-400 mg by mouth See admin instructions. tk 0.5 tab qam and 0.5 tab at noon, then take one tab qhs 01/03/17  Yes [provider]  risperidone (RISPERDAL) 4 MG tablet Take 4 mg by mouth 2 (two) times daily. 01/03/17  Yes [provider]  tamsulosin (FLOMAX) 0.4 MG CAPS capsule Take 1 capsule (0.4 mg total) by mouth daily. 01/28/17  Yes Theodoro Grist, MD  Tiotropium Bromide Monohydrate (SPIRIVA RESPIMAT) 2.5 MCG/ACT AERS Inhale 2 puffs into the lungs daily.   Yes [provider]  traZODone (DESYREL) 150 MG tablet Take 300 mg by mouth at bedtime as needed.  01/03/17  Yes [provider]  amLODipine (NORVASC) 5 MG tablet Take by mouth.    [provider]  fluPHENAZine (PROLIXIN) 5 MG tablet Take by mouth.    [provider]  pantoprazole (PROTONIX) 40 MG tablet Take by mouth.    [provider]  QUEtiapine (SEROQUEL XR) 400 MG 24 hr  tablet Take by mouth. 04/22/14   [provider]  tiotropium (SPIRIVA) 18 MCG inhalation capsule Place into inhaler and inhale.    [provider]   No Known Allergies Review of Systems  Unable to perform ROS: Psychiatric disorder   Physical Exam  Constitutional: He is easily aroused. He appears ill.  HENT:  Head: Normocephalic and atraumatic.  Cardiovascular: Regular rhythm.   Pulmonary/Chest: Effort normal. He has decreased breath sounds.  Abdominal: Normal appearance.  Neurological: He is easily aroused. He is disoriented.  Not answering questions or following commands  Skin: Skin is warm and dry.  Psychiatric: Cognition and memory are impaired. He is inattentive.  Nursing note and vitals reviewed.  Vital Signs: BP 140/78 (BP Location: Right Arm)   Pulse 100   Temp 98 F (36.7 C) (Oral)   Resp 18   Ht 5' 10"  (1.778 m)   Wt 80.7 kg (178 lb)   SpO2 99%   BMI 25.54 kg/m  Pain Assessment: Faces     SpO2: SpO2: 99 % O2 Device:SpO2: 99 % O2 Flow Rate: .O2 Flow Rate (L/min): 2 L/min  IO: Intake/output summary:   Intake/Output Summary (Last 24 hours) at 02/21/17 0946 Last data filed at 02/21/17 1478  Gross per 24 hour  Intake              709 ml  Output             1251 ml  Net             -542 ml    LBM: Last BM Date: 02/19/17 Baseline Weight: Weight: 81.6 kg (180 lb) Most recent weight: Weight: 80.7 kg (178 lb)     Palliative Assessment/Data: PPS 40%   Flowsheet Rows     Most Recent Value  Intake Tab  Referral Department  Hospitalist  Unit at Time of Referral  Cardiac/Telemetry Unit  Palliative Care Primary Diagnosis  Sepsis/Infectious Disease  Date Notified  02/20/17  Palliative Care Type  New Palliative care  Reason for referral  Clarify Goals of Care  Date of Admission  02/16/17  Date first seen by Palliative Care  02/20/17  # of days IP prior to Palliative referral  4  Clinical Assessment  Palliative Performance Scale Score  40%  Psychosocial & Spiritual Assessment  Palliative Care Outcomes  Patient/Family meeting held?  Yes  Who was at the meeting?  patient and mother  Palliative Care Outcomes  Clarified goals of care, Provided psychosocial or spiritual support, ACP counseling assistance      Time In: 1400 Time Out: 1450 Time Total: 11mn Greater than 50%  of this time was spent counseling and coordinating care related to the above assessment and plan.  Signed by:  MIhor Dow FNP-C Palliative Medicine Team  Phone: [phone]: [phone]  Please contact Palliative Medicine Team phone at [phone] questions and concerns.  For individual provider: See AShea Evans

## 2017-02-21 NOTE — Plan of Care (Signed)
Problem: Education: Goal: Knowledge of [facility] [place] [facility] information/materials will improve Outcome: Not Progressing Patient far too obtunded today. Has remained non-verbal for duration of day shift. Jason FavreSteven M Murray [facility]

## 2017-02-21 NOTE — Progress Notes (Signed)
PRE DIALYSIS ASSESSMENT 

## 2017-02-21 NOTE — Care Management (Addendum)
Patient transfered to 2A due to elevated heart rate.  Currently receiving dialysis.  [facility]  At present he is not able to sit for his treatments

## 2017-02-21 NOTE — Progress Notes (Signed)
[place], [place] 02/21/17  Subjective:  Patient seen at bedside. He will be due for hemodialysis today. His creatinine remains quite high at 8.08 with a BUN of 65. Urine output was only 1.1 L over the preceding 24 hours.   Objective:  Vital signs in last 24 hours:  Temp:  [97.8 F (36.6 C)-98.5 F (36.9 C)] 98 F (36.7 C) (07/20 0757) Pulse Rate:  [100-114] 100 (07/20 0757) Resp:  [16-20] 18 (07/20 0757) BP: (133-160)/(78-100) 140/78 (07/20 0757) SpO2:  [96 %-100 %] 99 % (07/20 0757) Weight:  [80.7 kg (178 lb)] 80.7 kg (178 lb) (07/19 1909)  Weight change: -0.363 kg (-12.8 oz) Filed Weights   02/18/17 0558 02/20/17 0549 02/20/17 1909  Weight: 84.1 kg (185 lb 6.5 oz) 81.1 kg (178 lb 12.8 oz) 80.7 kg (178 lb)    Intake/Output:    Intake/Output Summary (Last 24 hours) at 02/21/17 1136 Last data filed at 02/21/17 0936  Gross per 24 hour  Intake              709 ml  Output             1251 ml  Net             -542 ml     Physical Exam: General: No acute distress   HEENT Normocephalic/atraumatic, old mucosal moist   Neck supple  Pulm/lungs Scattered rhonchi, normal respiratory effort   CVS/Heart Regular; no rub  Abdomen:  Soft, distended, Bowel sounds present   Extremities: No edema  Neurologic: Currently resting comfortably in bed.   Skin: No acute rashes   Foley in place with yellow urine       Basic Metabolic Panel:   Recent Labs Lab 02/16/17 1615 02/16/17 1710 02/16/17 2140 02/16/17 2236 02/17/17 0500 02/19/17 0857 02/20/17 1001  NA 134* 133*  --   --  138 144 150*  K UNABLE TO REPORT DUE TO HEMOLYSIS 7.5* 3.8 3.6 3.5 3.2* 3.1*  CL 102 102  --   --  97* 103 109  CO2 10* 11*  --   --  27 24 24   GLUCOSE 143* 137*  --   --  115* 71 85  BUN 140* 134*  --   --  71* 59* 65*  CREATININE 15.49* 15.14*  --   --  8.39* 7.27* 8.08*  CALCIUM 9.8 9.5  --   --  8.3* 10.2 10.8*  MG  --   --   --   --  1.9  --   --   PHOS  --    --   --   --  6.8*  --   --      CBC:  Recent Labs Lab 02/16/17 1615 02/17/17 0500 02/20/17 1001  WBC 17.6* 12.2* 20.3*  NEUTROABS 16.7*  --   --   HGB 10.0* 8.0* 8.8*  HCT 29.6* 23.2* 26.5*  MCV 88.8 85.9 87.2  PLT 398 304 306      Lab Results  Component Value Date   HEPBSAG Negative 02/16/2017   HEPBSAB Non Reactive 02/16/2017      Microbiology:  Recent Results (from the past 240 hour(s))  Urine Culture     Status: Abnormal   Collection Time: 02/16/17  4:15 PM  Result Value Ref Range Status   Specimen Description URINE, RANDOM  Final   Special Requests NONE  Final   Culture (A)  Final    >=100,000 COLONIES/mL CITROBACTER KOSERI 50,000  COLONIES/mL PSEUDOMONAS AERUGINOSA    Report Status 02/19/2017 FINAL  Final   Organism ID, Bacteria CITROBACTER KOSERI (A)  Final   Organism ID, Bacteria PSEUDOMONAS AERUGINOSA (A)  Final      Susceptibility   Citrobacter koseri - MIC*    CEFAZOLIN <=4 SENSITIVE Sensitive     CEFTRIAXONE <=1 SENSITIVE Sensitive     CIPROFLOXACIN <=0.25 SENSITIVE Sensitive     GENTAMICIN <=1 SENSITIVE Sensitive     IMIPENEM <=0.25 SENSITIVE Sensitive     NITROFURANTOIN 32 SENSITIVE Sensitive     TRIMETH/SULFA <=20 SENSITIVE Sensitive     PIP/TAZO <=4 SENSITIVE Sensitive     * >=100,000 COLONIES/mL CITROBACTER KOSERI   Pseudomonas aeruginosa - MIC*    CEFTAZIDIME 4 SENSITIVE Sensitive     CIPROFLOXACIN <=0.25 SENSITIVE Sensitive     GENTAMICIN <=1 SENSITIVE Sensitive     IMIPENEM 1 SENSITIVE Sensitive     PIP/TAZO 8 SENSITIVE Sensitive     CEFEPIME 2 SENSITIVE Sensitive     * 50,000 COLONIES/mL PSEUDOMONAS AERUGINOSA  Culture, blood (Routine X 2) w Reflex to ID Panel     Status: None   Collection Time: 02/16/17  8:42 PM  Result Value Ref Range Status   Specimen Description BLOOD LEFT ARM  Final   Special Requests   Final    BOTTLES DRAWN AEROBIC AND ANAEROBIC Blood Culture adequate volume   Culture NO GROWTH 5 DAYS  Final   Report  Status 02/21/2017 FINAL  Final  Culture, blood (Routine X 2) w Reflex to ID Panel     Status: None   Collection Time: 02/16/17  8:52 PM  Result Value Ref Range Status   Specimen Description BLOOD LEFT HAND  Final   Special Requests   Final    BOTTLES DRAWN AEROBIC AND ANAEROBIC Blood Culture adequate volume   Culture NO GROWTH 5 DAYS  Final   Report Status 02/21/2017 FINAL  Final  MRSA PCR Screening     Status: None   Collection Time: 02/16/17  9:00 PM  Result Value Ref Range Status   MRSA by PCR NEGATIVE NEGATIVE Final    Comment:        The GeneXpert MRSA Assay (FDA approved for NASAL specimens only), is one component of a comprehensive MRSA colonization surveillance program. It is not intended to diagnose MRSA infection nor to guide or monitor treatment for MRSA infections.     Coagulation Studies: No results for input(s): LABPROT, INR in the last 72 hours.  Urinalysis: No results for input(s): COLORURINE, LABSPEC, PHURINE, GLUCOSEU, HGBUR, BILIRUBINUR, KETONESUR, PROTEINUR, UROBILINOGEN, NITRITE, LEUKOCYTESUR in the last 72 hours.  Invalid input(s): APPERANCEUR    Imaging: No results found.   Medications:   . ceFEPime (MAXIPIME) IV Stopped (02/20/17 1959)  . dextrose 5 % with KCl 20 mEq / L 20 mEq (02/21/17 0921)  . valproate sodium Stopped (02/21/17 1100)   . bacitracin   Left Eye BID  . chlorhexidine gluconate (MEDLINE KIT)  15 mL Mouth Rinse BID  . diltiazem  30 mg Oral Q6H  . feeding supplement (NEPRO CARB STEADY)  237 mL Oral TID BM  . fluPHENAZine  5 mg Oral QHS  . heparin  5,000 Units Subcutaneous Q8H  . mouth rinse  15 mL Mouth Rinse QID  . metoprolol tartrate  25 mg Oral BID  . multivitamin  1 tablet Oral QHS  . pantoprazole  40 mg Oral Daily  . QUEtiapine  200 mg Oral BID  . traZODone  50 mg Oral QHS     Assessment/ Plan:  56 y.o. AA male with COPD, HTN, Multinodular goiter, Schizoaffective disorder, CKD st 4, Primary hyperparathyroidism with  hypercalcemia admitted for ARF  1. ARF on CKD st 4  Baseline Cr 3.3/GFR 23 from 01/27/2017 Admit Cr 15 ; likely secondary to underlying volume depletion, sepsis and severe ATN 2.  Hyperkalemia, improved 3. Acidosis, improved 4. UTI with sepsis 5. Acute resp failure - extubated 02/18/17 6. Recent Urinary retention 7. Anemia of CKD.  8. Hypernatremia.  Plan: Awaiting new renal function testing today.  Tentatively we will go ahead and plan for hemodialysis today. Urine output was only 1.1 L over the preceding 24 hours. In addition he was hypernatremic yesterday therefore we started him on D5W and as above we are awaiting repeat serum electrolytes. We also plan to repeat serum potassium today as it was a bit low at 3.1. Patient is on IV fluids are supplemented with potassium chloride. Further plan as patient progresses. We will begin to search     LOS: [place], [place] 7/20/201811:36 [place], [place]

## 2017-02-21 NOTE — Plan of Care (Signed)
Problem: Safety: Goal: Ability to remain free from injury will improve Outcome: Not Progressing Pt is impulsive and has tried to get out of bed multiple time this shift. 1:1 with sitter

## 2017-02-21 NOTE — Progress Notes (Signed)
This note also relates to the following rows which could not be included: Pulse Rate - Cannot attach notes to unvalidated device data Resp - Cannot attach notes to unvalidated device data BP - Cannot attach notes to unvalidated device data  PATIENT ATTEMPTING TO GET OUT OF BED DURING DIALYSIS.  UNABLE TO OBTAIN ACCURATED BP READINGS.

## 2017-02-21 NOTE — Progress Notes (Signed)
This note also relates to the following rows which could not be included: Pulse Rate - Cannot attach notes to unvalidated device data Resp - Cannot attach notes to unvalidated device data BP - Cannot attach notes to unvalidated device data  DISCONTINUED DIALYSIS FOR PATIENT SAFETY

## 2017-02-21 NOTE — Consult Note (Signed)
[facility] Face-to-Face Psychiatry Consult   Reason for Consult:  Consult for 56 year old man with a history of schizophrenia who was brought into the hospital after a fall and injury and then required intubation. Referring Physician:  Posey Pronto Patient Identification: MARISA HUFSTETLER MRN:  [id number] Principal Diagnosis: Acute delirium Diagnosis:   Patient Active Problem List   Diagnosis Date Noted  . Acute renal failure superimposed on chronic kidney disease ([place]) [N17.9, N18.9]   . Palliative care by specialist [Z51.5]   . Goals of care, counseling/discussion [Z71.89]   . Acute delirium [R41.0] 02/19/2017  . Acute respiratory failure ([place]) [J96.00]   . Seizure-like activity ([place]) [R56.9]   . Sepsis ([place]) [A41.9]   . Septic shock ([place]) [A41.9, R65.21] 02/16/2017  . Schizoaffective disorder ([place]) [F25.9] 02/16/2017  . Hypercalcemia [E83.52] 01/27/2017  . Hematuria [R31.9] 01/27/2017  . Acute urinary retention [R33.8] 01/27/2017  . Hyponatremia [E87.1] 01/27/2017  . Hypokalemia [E87.6] 01/27/2017  . ARF (acute renal failure) ([place]) [N17.9] 01/23/2017    Total Time spent with patient: 20 minutes  Subjective:   REMMINGTON URIETA is a 56 y.o. male patient admitted with patient is not able to communicate.  Follow-up for Friday the 20th. Patient is now continuing to be unresponsive. Not responding to verbal stimulation. Not able to take oral medicine currently.  HPI:  Attempted to interview patient. Reviewed old chart. This is a 56 year old man who carries a diagnosis of schizophrenia or schizoaffective disorder and who lives in a group home. He was brought to the hospital after reportedly having a fall and hitting his head. He was thought to have some seizure-like activity but was also found to be an acute worsening renal failure. Patient was intubated and was in the ICU briefly and has now been extubated and moved to the floor. Patient on evaluation had his eyes open and appeared to be awake but was  not able to verbally interact. He was able to answer yes to a couple of questions but was not able to say anything else and was not able to follow direct commands or say his name. Affect flat and somewhat confused.  Social history: According to the chart he lives in a group home or assisted living type setting. He reports that they're his family at times that might be involved but the closest communication seems to be with the manager of the facility.  Medical history: Chronic renal failure which has worsened in the hospital. Possible new seizure activity.  Substance abuse history: No information  Past Psychiatric History: We don't have any previous psychiatric information other than the list of medicines that he was on. It looks like he was on quite a bit of psychiatric medicine including 3 antipsychotics between the Seroquel risperdal and Prolixin. We don't have any record of prior hospitalizations or symptoms at baseline.  Risk to Self: Is patient at risk for suicide?: No Risk to Others:   Prior Inpatient Therapy:   Prior Outpatient Therapy:    Past Medical History:  Past Medical History:  Diagnosis Date  . CKD (chronic kidney disease), stage IV ([place])   . COPD (chronic obstructive pulmonary disease) ([place])   . GERD (gastroesophageal reflux disease)   . Hypertension   . Multinodular goiter   . Schizoaffective disorder [facility])     Past Surgical History:  Procedure Laterality Date  . TONSILLECTOMY    . TOOTH EXTRACTION     Family History:  Family History  Problem Relation Age of Onset  . Kidney  disease Father   . Diabetes Father    Family Psychiatric  History: Unknown Social History:  History  Alcohol Use No     History  Drug Use No    Social History   Social History  . Marital status: Single    Spouse name: N/A  . Number of children: N/A  . Years of education: N/A   Social History Main Topics  . Smoking status: Current Every Day Smoker    Packs/day: 0.25    Types:  Cigarettes  . Smokeless tobacco: Never Used  . Alcohol use No  . Drug use: No  . Sexual activity: Not Asked   Other Topics Concern  . None   Social History Narrative   From [facility] group home   Additional Social History:    Allergies:  No Known Allergies  Labs:  Results for orders placed or performed during the hospital encounter of 02/16/17 (from the past 48 hour(s))  Basic metabolic panel     Status: Abnormal   Collection Time: 02/20/17 10:01 AM  Result Value Ref Range   Sodium 150 (H) 135 - 145 mmol/L   Potassium 3.1 (L) 3.5 - 5.1 mmol/L   Chloride 109 101 - 111 mmol/L   CO2 24 22 - 32 mmol/L   Glucose, Bld 85 65 - 99 mg/dL   BUN 65 (H) 6 - 20 mg/dL   Creatinine, Ser 8.08 (H) 0.61 - 1.24 mg/dL   Calcium 10.8 (H) 8.9 - 10.3 mg/dL   GFR calc non Af Amer 7 (L) >60 mL/min   GFR calc Af Amer 8 (L) >60 mL/min    Comment: (NOTE) The eGFR has been calculated using the CKD EPI equation. This calculation has not been validated in all clinical situations. eGFR's persistently <60 mL/min signify possible Chronic Kidney Disease.    Anion gap 17 (H) 5 - 15  CBC     Status: Abnormal   Collection Time: 02/20/17 10:01 AM  Result Value Ref Range   WBC 20.3 (H) 3.8 - 10.6 K/uL   RBC 3.04 (L) 4.40 - 5.90 MIL/uL   Hemoglobin 8.8 (L) 13.0 - 18.0 g/dL   HCT 26.5 (L) 40.0 - 52.0 %   MCV 87.2 80.0 - 100.0 fL   MCH 28.9 26.0 - 34.0 pg   MCHC 33.1 32.0 - 36.0 g/dL   RDW 14.8 (H) 11.5 - 14.5 %   Platelets 306 150 - 440 K/uL  Valproic acid level     Status: None   Collection Time: 02/21/17  8:25 AM  Result Value Ref Range   Valproic Acid Lvl 83 50.0 - 100.0 ug/mL    Current Facility-Administered Medications  Medication Dose Route Frequency Provider Last Rate Last Dose  . acetaminophen (TYLENOL) tablet 650 mg  650 mg Oral Q6H PRN Fritzi Mandes, MD       Or  . acetaminophen (TYLENOL) suppository 650 mg  650 mg Rectal Q6H PRN Fritzi Mandes, MD   650 mg at 02/19/17 2034  .  bacitracin ophthalmic ointment   Left Eye BID Dorene Sorrow S, NP      . bisacodyl (DULCOLAX) suppository 10 mg  10 mg Rectal Daily PRN Tukov, Magadalene S, NP      . budesonide (PULMICORT) nebulizer solution 0.25 mg  0.25 mg Nebulization BID PRN Awilda Bill, NP      . ceFEPIme (MAXIPIME) 1 g in dextrose 5 % 50 mL IVPB  1 g Intravenous q1800 Fritzi Mandes, MD   Stopped  at 02/20/17 1959  . chlorhexidine gluconate (MEDLINE KIT) (PERIDEX) 0.12 % solution 15 mL  15 mL Mouth Rinse BID Dorene Sorrow S, NP   15 mL at 02/20/17 2000  . dextrose 5 % with KCl 20 mEq / L  infusion  20 mEq Intravenous Continuous Lateef, Munsoor, MD 75 mL/hr at 02/21/17 0921 20 mEq at 02/21/17 0921  . diltiazem (CARDIZEM) tablet 30 mg  30 mg Oral Q6H Fritzi Mandes, MD   30 mg at 02/21/17 0646  . feeding supplement (NEPRO CARB STEADY) liquid 237 mL  237 mL Oral TID BM Fritzi Mandes, MD   237 mL at 02/20/17 2200  . fluPHENAZine (PROLIXIN) tablet 5 mg  5 mg Oral QHS Flora Lipps, MD   5 mg at 02/20/17 2156  . heparin injection 5,000 Units  5,000 Units Subcutaneous Q8H Fritzi Mandes, MD   5,000 Units at 02/21/17 [phone]  . hydrALAZINE (APRESOLINE) injection 10-20 mg  10-20 mg Intravenous Q4H PRN Wilhelmina Mcardle, MD   10 mg at 02/19/17 1539  . ipratropium-albuterol (DUONEB) 0.5-2.5 (3) MG/3ML nebulizer solution 3 mL  3 mL Nebulization Q4H PRN Awilda Bill, NP      . LORazepam (ATIVAN) injection 2 mg  2 mg Intravenous Q6H PRN Saundra Shelling, MD   2 mg at 02/21/17 0511  . MEDLINE mouth rinse  15 mL Mouth Rinse QID Tukov, Magadalene S, NP   15 mL at 02/20/17 0400  . metoprolol tartrate (LOPRESSOR) injection 2.5-5 mg  2.5-5 mg Intravenous Q3H PRN Wilhelmina Mcardle, MD   2.5 mg at 02/20/17 1116  . metoprolol tartrate (LOPRESSOR) tablet 25 mg  25 mg Oral BID Saundra Shelling, MD   25 mg at 02/20/17 2158  . multivitamin (RENA-VIT) tablet 1 tablet  1 tablet Oral QHS Fritzi Mandes, MD   1 tablet at 02/20/17 2156  . pantoprazole (PROTONIX) EC  tablet 40 mg  40 mg Oral Daily Wilhelmina Mcardle, MD   40 mg at 02/20/17 0933  . QUEtiapine (SEROQUEL) tablet 200 mg  200 mg Oral BID Wilhelmina Mcardle, MD   200 mg at 02/20/17 2155  . sennosides (SENOKOT) 8.8 MG/5ML syrup 5 mL  5 mL Per Tube BID PRN Tukov, Magadalene S, NP      . sodium chloride flush (NS) 0.9 % injection 10-40 mL  10-40 mL Intracatheter PRN Fritzi Mandes, MD      . traZODone (DESYREL) tablet 50 mg  50 mg Oral QHS Hugelmeyer, Alexis, DO   50 mg at 02/21/17 0022  . tuberculin injection 5 Units  5 Units Intradermal Once Lateef, Munsoor, MD   5 Units at 02/21/17 1402  . valproate (DEPACON) 1,500 mg in dextrose 5 % 50 mL IVPB  1,500 mg Intravenous Q12H Alexis Goodell, MD   Stopped at 02/21/17 1100    Musculoskeletal: Strength & Muscle Tone: decreased Gait & Station: unable to stand Patient leans: N/A  Psychiatric Specialty Exam: Physical Exam  Nursing note and vitals reviewed. Constitutional: He appears well-developed and well-nourished.  HENT:  Head: Normocephalic and atraumatic.  Eyes: Pupils are equal, round, and reactive to light. Conjunctivae are normal.  Neck: Normal range of motion.  Cardiovascular: Normal heart sounds.   Respiratory: Effort normal.  GI: Soft.  Genitourinary:     Musculoskeletal: Normal range of motion.  Neurological: He is alert. He displays tremor. He exhibits abnormal muscle tone.  Skin: Skin is warm and dry.  Psychiatric: His affect is blunt. His speech is delayed. He  is agitated and slowed. Cognition and memory are impaired. He is noncommunicative.    Review of Systems  Unable to perform ROS: Medical condition    Blood pressure (!) 142/88, pulse (!) 136, temperature 98 F (36.7 C), temperature source Axillary, resp. rate (!) 23, height 5' 10"  (1.778 m), weight 178 lb (80.7 kg), SpO2 99 %.Body mass index is 25.54 kg/m.  General Appearance: Disheveled  Eye Contact:  Minimal  Speech:  Garbled  Volume:  Decreased  Mood:  Negative    Affect:  Negative  Thought Process:  NA  Orientation:  Negative  Thought Content:  Negative  Suicidal Thoughts:  No  Homicidal Thoughts:  No  Memory:  Negative  Judgement:  Negative  Insight:  Negative  Psychomotor Activity:  Negative  Concentration:  Concentration: Negative  Recall:  Negative  Fund of Knowledge:  Negative  Language:  Negative  Akathisia:  Negative  Handed:  Right  AIMS (if indicated):     Assets:  Housing  ADL's:  Impaired  Cognition:  Impaired,  Severe  Sleep:        Treatment Plan Summary: Daily contact with patient to assess and evaluate symptoms and progress in treatment, Medication management and Plan 56 year old man with a history of chronic mental illness. Currently unresponsive. Multiple probable etiologies including infection possible neurologic condition. Can't rule out catatonia although I suspect it would be one factor among many. Patient is not receiving his oral antipsychotics because he is unable to take oral medicine which is maintaining on the one hand that it is not possible that these are causing oversedation but on the other hand it is possible that he could be suffering from a lack of his antipsychotic. At this point I will probably error on the side of not restarting any acute antipsychotic while he is still so medically sick but will sign this out to the doctor on call and continue to follow-up regularly.  Disposition: Patient does not meet criteria for psychiatric inpatient admission. Supportive therapy provided about ongoing stressors.  Alethia Berthold, MD 02/21/2017 4:24 PM

## 2017-02-21 NOTE — Clinical Social Work Note (Signed)
Patient is from [facility] as a long term care resident, patient plans to return back to SNF once he is medically ready for discharge and orders have been received.  Ervin KnackEric R. Jeiden Daughtridge, MSW, Theresia MajorsLCSWA [phone]  02/21/2017 5:56 PM

## 2017-02-22 ENCOUNTER — Encounter: Payer: Self-pay | Admitting: *Deleted

## 2017-02-22 LAB — VALPROIC ACID LEVEL: VALPROIC ACID LVL: 118 ug/mL — AB (ref 50.0–100.0)

## 2017-02-22 MED ORDER — HALOPERIDOL LACTATE 5 MG/ML IJ SOLN
1.0000 mg | Freq: Four times a day (QID) | INTRAMUSCULAR | Status: DC | PRN
  Filled 2017-02-22: qty 0.2

## 2017-02-22 MED ORDER — HALOPERIDOL LACTATE 2 MG/ML PO CONC
2.0000 mg | Freq: Three times a day (TID) | ORAL | Status: DC | PRN
  Filled 2017-02-22: qty 1

## 2017-02-22 MED ORDER — VALPROATE SODIUM 500 MG/5ML IV SOLN
1250.0000 mg | Freq: Two times a day (BID) | INTRAVENOUS | Status: DC
  Administered 2017-02-22 – 2017-02-25 (×6): 1250 mg via INTRAVENOUS
  Filled 2017-02-22 (×7): qty 12.5

## 2017-02-22 NOTE — Progress Notes (Signed)
Subjective: Patient lethargic.  Awakens at times but appears very agitated.    Objective: Current vital signs: BP 131/84   Pulse (!) 107   Temp 98.3 F (36.8 C) (Oral)   Resp 16   Ht 5' 10"  (1.778 m)   Wt 80.7 kg (178 lb)   SpO2 99%   BMI 25.54 kg/m  Vital signs in last 24 hours: Temp:  [97.9 F (36.6 C)-98.3 F (36.8 C)] 98.3 F (36.8 C) (07/21 0520) Pulse Rate:  [98-136] 107 (07/21 0520) Resp:  [16-26] 16 (07/21 0520) BP: (125-196)/(82-182) 131/84 (07/21 0520) SpO2:  [99 %-100 %] 99 % (07/21 0520)  Intake/Output from previous day: 07/20 0701 - 07/21 0700 In: 466 [I.V.:401; IV Piggyback:65] Out: 0865 [Urine:1900] Intake/Output this shift: Total I/O In: 231 [I.V.:231] Out: 0  Nutritional status: DIET - DYS 1 Room service appropriate? Yes with Assist; Fluid consistency: Nectar Thick  Neurologic Exam: Mental Status: Lethargic.  Does some grunting with deep sternal rib but no other response.  Does not follow commands.  No speech. Cranial Nerves: II: Pupils equal, round, reactive to light and accommodation III,IV, VI: Intact oculocephalic maneuvers V,VII: corneals intactbilaterally VIII: unable to be tested IX,X: gag reflex present XI: unable to be tested XII: unable to be tested Motor: Moves left side more than right with some localization to pain with the left upper extremity.  Sensory: Responds to noxious stimuli throughout  Lab Results: Basic Metabolic Panel:  Recent Labs Lab 02/16/17 1710  02/16/17 2236 02/17/17 0500 02/19/17 0857 02/20/17 1001 02/21/17 1518  NA 133*  --   --  138 144 150* 146*  K 7.5*  < > 3.6 3.5 3.2* 3.1* 2.7*  CL 102  --   --  97* 103 109 109  CO2 11*  --   --  27 24 24 28   GLUCOSE 137*  --   --  115* 71 85 109*  BUN 134*  --   --  71* 59* 65* 52*  CREATININE 15.14*  --   --  8.39* 7.27* 8.08* 5.85*  CALCIUM 9.5  --   --  8.3* 10.2 10.8* 9.8  MG  --   --   --  1.9  --   --   --   PHOS  --   --   --  6.8*  --   --  4.1  < >  = values in this interval not displayed.  Liver Function Tests:  Recent Labs Lab 02/16/17 1615 02/16/17 1710  AST UNABLE TO REPORT DUE TO HEMOLYSIS 28  ALT 18 22  ALKPHOS 114 109  BILITOT 1.5* 1.0  PROT 8.2* 7.9  ALBUMIN 3.1* 3.0*   No results for input(s): LIPASE, AMYLASE in the last 168 hours. No results for input(s): AMMONIA in the last 168 hours.  CBC:  Recent Labs Lab 02/16/17 1615 02/17/17 0500 02/20/17 1001  WBC 17.6* 12.2* 20.3*  NEUTROABS 16.7*  --   --   HGB 10.0* 8.0* 8.8*  HCT 29.6* 23.2* 26.5*  MCV 88.8 85.9 87.2  PLT 398 304 306    Cardiac Enzymes:  Recent Labs Lab 02/16/17 1615  TROPONINI 0.04*    Lipid Panel:  Recent Labs Lab 02/16/17 2051  TRIG 265*    CBG:  Recent Labs Lab 02/17/17 2044 02/18/17 0006 02/18/17 0408 02/18/17 0728 02/18/17 1141  GLUCAP 77 102* 88 95 76    Microbiology: Results for orders placed or performed during the hospital encounter of 02/16/17  Urine Culture  Status: Abnormal   Collection Time: 02/16/17  4:15 PM  Result Value Ref Range Status   Specimen Description URINE, RANDOM  Final   Special Requests NONE  Final   Culture (A)  Final    >=100,000 COLONIES/mL CITROBACTER KOSERI 50,000 COLONIES/mL PSEUDOMONAS AERUGINOSA    Report Status 02/19/2017 FINAL  Final   Organism ID, Bacteria CITROBACTER KOSERI (A)  Final   Organism ID, Bacteria PSEUDOMONAS AERUGINOSA (A)  Final      Susceptibility   Citrobacter koseri - MIC*    CEFAZOLIN <=4 SENSITIVE Sensitive     CEFTRIAXONE <=1 SENSITIVE Sensitive     CIPROFLOXACIN <=0.25 SENSITIVE Sensitive     GENTAMICIN <=1 SENSITIVE Sensitive     IMIPENEM <=0.25 SENSITIVE Sensitive     NITROFURANTOIN 32 SENSITIVE Sensitive     TRIMETH/SULFA <=20 SENSITIVE Sensitive     PIP/TAZO <=4 SENSITIVE Sensitive     * >=100,000 COLONIES/mL CITROBACTER KOSERI   Pseudomonas aeruginosa - MIC*    CEFTAZIDIME 4 SENSITIVE Sensitive     CIPROFLOXACIN <=0.25 SENSITIVE  Sensitive     GENTAMICIN <=1 SENSITIVE Sensitive     IMIPENEM 1 SENSITIVE Sensitive     PIP/TAZO 8 SENSITIVE Sensitive     CEFEPIME 2 SENSITIVE Sensitive     * 50,000 COLONIES/mL PSEUDOMONAS AERUGINOSA  Culture, blood (Routine X 2) w Reflex to ID Panel     Status: None   Collection Time: 02/16/17  8:42 PM  Result Value Ref Range Status   Specimen Description BLOOD LEFT ARM  Final   Special Requests   Final    BOTTLES DRAWN AEROBIC AND ANAEROBIC Blood Culture adequate volume   Culture NO GROWTH 5 DAYS  Final   Report Status 02/21/2017 FINAL  Final  Culture, blood (Routine X 2) w Reflex to ID Panel     Status: None   Collection Time: 02/16/17  8:52 PM  Result Value Ref Range Status   Specimen Description BLOOD LEFT HAND  Final   Special Requests   Final    BOTTLES DRAWN AEROBIC AND ANAEROBIC Blood Culture adequate volume   Culture NO GROWTH 5 DAYS  Final   Report Status 02/21/2017 FINAL  Final  MRSA PCR Screening     Status: None   Collection Time: 02/16/17  9:00 PM  Result Value Ref Range Status   MRSA by PCR NEGATIVE NEGATIVE Final    Comment:        The GeneXpert MRSA Assay (FDA approved for NASAL specimens only), is one component of a comprehensive MRSA colonization surveillance program. It is not intended to diagnose MRSA infection nor to guide or monitor treatment for MRSA infections.     Coagulation Studies: No results for input(s): LABPROT, INR in the last 72 hours.  Imaging: Ct Head Wo Contrast  Result Date: 02/21/2017 CLINICAL DATA:  Post fall on July 15th.  Patient now with lethargy. EXAM: CT HEAD WITHOUT CONTRAST TECHNIQUE: Contiguous axial images were obtained from the base of the skull through the vertex without intravenous contrast. COMPARISON:  02/16/2017 FINDINGS: Brain: Similar findings of age advanced atrophy with sulcal prominence and and centralized volume loss with ex vacuo dilatation of the ventricular system. Unchanged scattered periventricular  hypodensities compatible with microvascular ischemic disease. The gray-white differentiation is otherwise well maintained without CT evidence of acute large territory infarct. No intraparenchymal or extra-axial mass or hemorrhage. Unchanged size and configuration the ventricles and the basilar cisterns. No midline shift Vascular: No hyperdense vessel or unexpected calcification. Skull: No  displaced calvarial fracture. Sinuses/Orbits: Underpneumatization of the bilateral frontal sinuses. The remaining paranasal sinuses and mastoid air cells are normally aerated. Other: Regional soft tissues appear normal. IMPRESSION: Similar findings of age advanced atrophy and mild microvascular ischemic disease without acute intracranial process. Electronically Signed   By: Sandi Mariscal M.D.   On: 02/21/2017 14:32    Medications:  I have reviewed the patient's current medications. Scheduled: . chlorhexidine gluconate (MEDLINE KIT)  15 mL Mouth Rinse BID  . diltiazem  30 mg Oral Q6H  . feeding supplement (NEPRO CARB STEADY)  237 mL Oral TID BM  . fluPHENAZine  5 mg Oral QHS  . heparin  5,000 Units Subcutaneous Q8H  . mouth rinse  15 mL Mouth Rinse QID  . metoprolol tartrate  25 mg Oral BID  . multivitamin  1 tablet Oral QHS  . pantoprazole  40 mg Oral Daily  . QUEtiapine  200 mg Oral BID  . traZODone  50 mg Oral QHS  . tuberculin  5 Units Intradermal Once    Assessment/Plan: Patient is moving all his extremities  Unclear etiology although may be related to metabolic abnormalities from poor renal function.   CTH repeat no acute abnormalities VPA level of 118.  VPA dose lowered to 1250 BID. VPA level for AM ordered.    Leotis Pain  02/22/2017  12:22 PM

## 2017-02-22 NOTE — Progress Notes (Signed)
Pt transferred to [facility]. Report given. Belongings transferred with pt.

## 2017-02-22 NOTE — Progress Notes (Signed)
[facility], [place] 02/22/17  Subjective:  Patient awake but not following commands. He had partial dialysis treatment yesterday but was moving around quite a lot therefore dialysis treatment had to be shortened. Mother at the bedside.   Objective:  Vital signs in last 24 hours:  Temp:  [97.9 F (36.6 C)-98.3 F (36.8 C)] 98.3 F (36.8 C) (07/21 0520) Pulse Rate:  [98-136] 107 (07/21 0520) Resp:  [16-26] 16 (07/21 0520) BP: (125-196)/(82-182) 131/84 (07/21 0520) SpO2:  [99 %-100 %] 99 % (07/21 0520)  Weight change:  Filed Weights   02/18/17 0558 02/20/17 0549 02/20/17 1909  Weight: 84.1 kg (185 lb 6.5 oz) 81.1 kg (178 lb 12.8 oz) 80.7 kg (178 lb)    Intake/Output:    Intake/Output Summary (Last 24 hours) at 02/22/17 1149 Last data filed at 02/22/17 1018  Gross per 24 hour  Intake              466 ml  Output             1050 ml  Net             -584 ml     Physical Exam: General: No acute distress   HEENT Normocephalic/atraumatic, old mucosal moist   Neck supple  Pulm/lungs Scattered rhonchi, normal respiratory effort   CVS/Heart Regular; no rub  Abdomen:  Soft, distended, Bowel sounds present   Extremities: No edema  Neurologic: Awake but not following commands   Skin: No acute rashes   Foley in place with yellow urine       Basic Metabolic Panel:   Recent Labs Lab 02/16/17 1710  02/16/17 2236 02/17/17 0500 02/19/17 0857 02/20/17 1001 02/21/17 1518  NA 133*  --   --  138 144 150* 146*  K 7.5*  < > 3.6 3.5 3.2* 3.1* 2.7*  CL 102  --   --  97* 103 109 109  CO2 11*  --   --  27 24 24 28   GLUCOSE 137*  --   --  115* 71 85 109*  BUN 134*  --   --  71* 59* 65* 52*  CREATININE 15.14*  --   --  8.39* 7.27* 8.08* 5.85*  CALCIUM 9.5  --   --  8.3* 10.2 10.8* 9.8  MG  --   --   --  1.9  --   --   --   PHOS  --   --   --  6.8*  --   --  4.1  < > = values in this interval not displayed.   CBC:  Recent Labs Lab  02/16/17 1615 02/17/17 0500 02/20/17 1001  WBC 17.6* 12.2* 20.3*  NEUTROABS 16.7*  --   --   HGB 10.0* 8.0* 8.8*  HCT 29.6* 23.2* 26.5*  MCV 88.8 85.9 87.2  PLT 398 304 306      Lab Results  Component Value Date   HEPBSAG Negative 02/16/2017   HEPBSAB Non Reactive 02/16/2017      Microbiology:  Recent Results (from the past 240 hour(s))  Urine Culture     Status: Abnormal   Collection Time: 02/16/17  4:15 PM  Result Value Ref Range Status   Specimen Description URINE, RANDOM  Final   Special Requests NONE  Final   Culture (A)  Final    >=100,000 COLONIES/mL CITROBACTER KOSERI 50,000 COLONIES/mL PSEUDOMONAS AERUGINOSA    Report Status 02/19/2017 FINAL  Final   Organism ID, Bacteria  CITROBACTER KOSERI (A)  Final   Organism ID, Bacteria PSEUDOMONAS AERUGINOSA (A)  Final      Susceptibility   Citrobacter koseri - MIC*    CEFAZOLIN <=4 SENSITIVE Sensitive     CEFTRIAXONE <=1 SENSITIVE Sensitive     CIPROFLOXACIN <=0.25 SENSITIVE Sensitive     GENTAMICIN <=1 SENSITIVE Sensitive     IMIPENEM <=0.25 SENSITIVE Sensitive     NITROFURANTOIN 32 SENSITIVE Sensitive     TRIMETH/SULFA <=20 SENSITIVE Sensitive     PIP/TAZO <=4 SENSITIVE Sensitive     * >=100,000 COLONIES/mL CITROBACTER KOSERI   Pseudomonas aeruginosa - MIC*    CEFTAZIDIME 4 SENSITIVE Sensitive     CIPROFLOXACIN <=0.25 SENSITIVE Sensitive     GENTAMICIN <=1 SENSITIVE Sensitive     IMIPENEM 1 SENSITIVE Sensitive     PIP/TAZO 8 SENSITIVE Sensitive     CEFEPIME 2 SENSITIVE Sensitive     * 50,000 COLONIES/mL PSEUDOMONAS AERUGINOSA  Culture, blood (Routine X 2) w Reflex to ID Panel     Status: None   Collection Time: 02/16/17  8:42 PM  Result Value Ref Range Status   Specimen Description BLOOD LEFT ARM  Final   Special Requests   Final    BOTTLES DRAWN AEROBIC AND ANAEROBIC Blood Culture adequate volume   Culture NO GROWTH 5 DAYS  Final   Report Status 02/21/2017 FINAL  Final  Culture, blood (Routine X 2) w  Reflex to ID Panel     Status: None   Collection Time: 02/16/17  8:52 PM  Result Value Ref Range Status   Specimen Description BLOOD LEFT HAND  Final   Special Requests   Final    BOTTLES DRAWN AEROBIC AND ANAEROBIC Blood Culture adequate volume   Culture NO GROWTH 5 DAYS  Final   Report Status 02/21/2017 FINAL  Final  MRSA PCR Screening     Status: None   Collection Time: 02/16/17  9:00 PM  Result Value Ref Range Status   MRSA by PCR NEGATIVE NEGATIVE Final    Comment:        The GeneXpert MRSA Assay (FDA approved for NASAL specimens only), is one component of a comprehensive MRSA colonization surveillance program. It is not intended to diagnose MRSA infection nor to guide or monitor treatment for MRSA infections.     Coagulation Studies: No results for input(s): LABPROT, INR in the last 72 hours.  Urinalysis: No results for input(s): COLORURINE, LABSPEC, PHURINE, GLUCOSEU, HGBUR, BILIRUBINUR, KETONESUR, PROTEINUR, UROBILINOGEN, NITRITE, LEUKOCYTESUR in the last 72 hours.  Invalid input(s): APPERANCEUR    Imaging: Ct Head Wo Contrast  Result Date: 02/21/2017 CLINICAL DATA:  Post fall on July 15th.  Patient now with lethargy. EXAM: CT HEAD WITHOUT CONTRAST TECHNIQUE: Contiguous axial images were obtained from the base of the skull through the vertex without intravenous contrast. COMPARISON:  02/16/2017 FINDINGS: Brain: Similar findings of age advanced atrophy with sulcal prominence and and centralized volume loss with ex vacuo dilatation of the ventricular system. Unchanged scattered periventricular hypodensities compatible with microvascular ischemic disease. The gray-white differentiation is otherwise well maintained without CT evidence of acute large territory infarct. No intraparenchymal or extra-axial mass or hemorrhage. Unchanged size and configuration the ventricles and the basilar cisterns. No midline shift Vascular: No hyperdense vessel or unexpected calcification.  Skull: No displaced calvarial fracture. Sinuses/Orbits: Underpneumatization of the bilateral frontal sinuses. The remaining paranasal sinuses and mastoid air cells are normally aerated. Other: Regional soft tissues appear normal. IMPRESSION: Similar findings of age advanced atrophy  and mild microvascular ischemic disease without acute intracranial process. Electronically Signed   By: Sandi Mariscal M.D.   On: 02/21/2017 14:32     Medications:   . ceFEPime (MAXIPIME) IV Stopped (02/21/17 1850)  . dextrose 5 % with KCl 20 mEq / L 20 mEq (02/22/17 0611)  . valproate sodium 1,500 mg (02/22/17 1002)   . chlorhexidine gluconate (MEDLINE KIT)  15 mL Mouth Rinse BID  . diltiazem  30 mg Oral Q6H  . feeding supplement (NEPRO CARB STEADY)  237 mL Oral TID BM  . fluPHENAZine  5 mg Oral QHS  . heparin  5,000 Units Subcutaneous Q8H  . mouth rinse  15 mL Mouth Rinse QID  . metoprolol tartrate  25 mg Oral BID  . multivitamin  1 tablet Oral QHS  . pantoprazole  40 mg Oral Daily  . QUEtiapine  200 mg Oral BID  . traZODone  50 mg Oral QHS  . tuberculin  5 Units Intradermal Once     Assessment/ Plan:  56 y.o. AA male with COPD, HTN, Multinodular goiter, Schizoaffective disorder, CKD st 4, Primary hyperparathyroidism with hypercalcemia admitted for ARF  1. ARF on CKD st 4  Baseline Cr 3.3/GFR 23 from 01/27/2017 Admit Cr 15 ; likely secondary to underlying volume depletion, sepsis and severe ATN 2.  Hyperkalemia, improved 3. Acidosis, improved 4. UTI with sepsis 5. Acute resp failure - extubated 02/18/17 6. Recent Urinary retention 7. Anemia of CKD.  8. Hypernatremia.  Plan: Dialysis was attempted yesterday however patient was moving around quite a bit therefore dialysis treatment had to be shortened. Unclear as to whether he would be able to perform dialysis as an outpatient given his current state. He is unable to sit up for dialysis or follow commands currently. This does present a difficult  situation. We will need to continue to work with care management to find an appropriate disposition for the patient. For now continue supportive care. Next dialysis to be attempted on Monday.     LOS: 6 Jason Diaz 7/21/201811:49 [place], [place]

## 2017-02-22 NOTE — Plan of Care (Signed)
Problem: Education: Goal: Knowledge of [facility] Education information/materials will improve Outcome: Not Progressing Pt confused  Problem: Health Behavior/Discharge Planning: Goal: Ability to manage health-related needs will improve Outcome: Not Progressing Pt requires assistance   

## 2017-02-22 NOTE — Consult Note (Addendum)
[facility] Face-to-Face Psychiatry Consult   Reason for Consult:  Consult for 56 year old man with a history of schizophrenia who was brought into the hospital after a fall and injury and then required intubation. Referring Physician:  Posey Pronto Patient Identification: Jason Diaz MRN:  [id number] Principal Diagnosis: Acute delirium Diagnosis:   Patient Active Problem List   Diagnosis Date Noted  . Acute renal failure superimposed on chronic kidney disease ([place]) [N17.9, N18.9]   . Palliative care by specialist [Z51.5]   . Goals of care, counseling/discussion [Z71.89]   . Acute delirium [R41.0] 02/19/2017  . Acute respiratory failure ([place]) [J96.00]   . Seizure-like activity ([place]) [R56.9]   . Sepsis ([place]) [A41.9]   . Septic shock ([place]) [A41.9, R65.21] 02/16/2017  . Schizoaffective disorder ([place]) [F25.9] 02/16/2017  . Hypercalcemia [E83.52] 01/27/2017  . Hematuria [R31.9] 01/27/2017  . Acute urinary retention [R33.8] 01/27/2017  . Hyponatremia [E87.1] 01/27/2017  . Hypokalemia [E87.6] 01/27/2017  . ARF (acute renal failure) ([place]) [N17.9] 01/23/2017    Total Time spent with patient: 20 minutes  Subjective:   Jason Diaz is a 56 y.o. male patient admitted with patient is not able to communicate.   02/22/17:The patient remained sedated and is nonresponsive to this Probation officer. Per the sitter at the bedside, he has been mumbling at times. No seizure activity noted. He has been taking some medications. Although prior notes indicate that he is not on any psychotropic medications, according to the [facility] the patient was getting Seroquel 200 mg by mouth twice a day and Prolixin 5 mg by mouth nightly.   HPI:  Attempted to interview patient. Reviewed old chart. This is a 56 year old man who carries a diagnosis of schizophrenia or schizoaffective disorder and who lives in a group home. He was brought to the hospital after reportedly having a fall and hitting his head. He was thought to have some  seizure-like activity but was also found to be an acute worsening renal failure. Patient was intubated and was in the ICU briefly and has now been extubated and moved to the floor. Patient on evaluation had his eyes open and appeared to be awake but was not able to verbally interact. He was able to answer yes to a couple of questions but was not able to say anything else and was not able to follow direct commands or say his name. Affect flat and somewhat confused.  Social history: According to the chart he lives in a group home or assisted living type setting. He reports that they're his family at times that might be involved but the closest communication seems to be with the manager of the facility.  Medical history: Chronic renal failure which has worsened in the hospital. Possible new seizure activity.  Substance abuse history: No information  Past Psychiatric History: We don't have any previous psychiatric information other than the list of medicines that he was on. It looks like he was on quite a bit of psychiatric medicine including 3 antipsychotics between the Seroquel risperdal and Prolixin. We don't have any record of prior hospitalizations or symptoms at baseline.  Risk to Self: Is patient at risk for suicide?: No Risk to Others:   Prior Inpatient Therapy:   Prior Outpatient Therapy:    Past Medical History:  Past Medical History:  Diagnosis Date  . CKD (chronic kidney disease), stage IV ([place])   . COPD (chronic obstructive pulmonary disease) ([place])   . GERD (gastroesophageal reflux disease)   . Hypertension   . Multinodular  goiter   . Schizoaffective disorder [facility])     Past Surgical History:  Procedure Laterality Date  . TONSILLECTOMY    . TOOTH EXTRACTION     Family History:  Family History  Problem Relation Age of Onset  . Kidney disease Father   . Diabetes Father    Family Psychiatric  History: Unknown Social History:  History  Alcohol Use No     History  Drug  Use No    Social History   Social History  . Marital status: Single    Spouse name: N/A  . Number of children: N/A  . Years of education: N/A   Social History Main Topics  . Smoking status: Current Every Day Smoker    Packs/day: 0.25    Types: Cigarettes  . Smokeless tobacco: Never Used  . Alcohol use No  . Drug use: No  . Sexual activity: Not Asked   Other Topics Concern  . None   Social History Narrative   From [facility]       Allergies:  No Known Allergies  Labs:  Results for orders placed or performed during the hospital encounter of 02/16/17 (from the past 48 hour(s))  Valproic acid level     Status: None   Collection Time: 02/21/17  8:25 AM  Result Value Ref Range   Valproic Acid Lvl 83 50.0 - 100.0 ug/mL  Phosphorus     Status: None   Collection Time: 02/21/17  3:18 PM  Result Value Ref Range   Phosphorus 4.1 2.5 - 4.6 mg/dL  Basic metabolic panel     Status: Abnormal   Collection Time: 02/21/17  3:18 PM  Result Value Ref Range   Sodium 146 (H) 135 - 145 mmol/L   Potassium 2.7 (LL) 3.5 - 5.1 mmol/L    Comment: CRITICAL RESULT CALLED TO, READ BACK BY AND VERIFIED WITH STEVE IMHOFF AT 1633 ON 02/21/17 BY SNJ    Chloride 109 101 - 111 mmol/L   CO2 28 22 - 32 mmol/L   Glucose, Bld 109 (H) 65 - 99 mg/dL   BUN 52 (H) 6 - 20 mg/dL   Creatinine, Ser 5.85 (H) 0.61 - 1.24 mg/dL   Calcium 9.8 8.9 - 10.3 mg/dL   GFR calc non Af Amer 10 (L) >60 mL/min   GFR calc Af Amer 12 (L) >60 mL/min    Comment: (NOTE) The eGFR has been calculated using the CKD EPI equation. This calculation has not been validated in all clinical situations. eGFR's persistently <60 mL/min signify possible Chronic Kidney Disease.    Anion gap 9 5 - 15  Valproic acid level     Status: Abnormal   Collection Time: 02/22/17  5:27 AM  Result Value Ref Range   Valproic Acid Lvl 118 (H) 50.0 - 100.0 ug/mL    Current Facility-Administered Medications  Medication Dose Route  Frequency Provider Last Rate Last Dose  . acetaminophen (TYLENOL) tablet 650 mg  650 mg Oral Q6H PRN Fritzi Mandes, MD       Or  . acetaminophen (TYLENOL) suppository 650 mg  650 mg Rectal Q6H PRN Fritzi Mandes, MD   650 mg at 02/19/17 2034  . bisacodyl (DULCOLAX) suppository 10 mg  10 mg Rectal Daily PRN Tukov, Magadalene S, NP      . budesonide (PULMICORT) nebulizer solution 0.25 mg  0.25 mg Nebulization BID PRN Awilda Bill, NP      . chlorhexidine gluconate (MEDLINE KIT) (PERIDEX) 0.12 % solution  15 mL  15 mL Mouth Rinse BID Dorene Sorrow S, NP   15 mL at 02/22/17 1003  . dextrose 5 % with KCl 20 mEq / L  infusion  20 mEq Intravenous Continuous Lateef, Munsoor, MD 75 mL/hr at 02/22/17 1759 20 mEq at 02/22/17 1759  . diltiazem (CARDIZEM) tablet 30 mg  30 mg Oral Q6H Fritzi Mandes, MD   30 mg at 02/22/17 1443  . feeding supplement (NEPRO CARB STEADY) liquid 237 mL  237 mL Oral TID BM Fritzi Mandes, MD   237 mL at 02/22/17 1002  . heparin injection 5,000 Units  5,000 Units Subcutaneous Q8H Fritzi Mandes, MD   5,000 Units at 02/22/17 1442  . hydrALAZINE (APRESOLINE) injection 10-20 mg  10-20 mg Intravenous Q4H PRN Wilhelmina Mcardle, MD   10 mg at 02/19/17 1539  . ipratropium-albuterol (DUONEB) 0.5-2.5 (3) MG/3ML nebulizer solution 3 mL  3 mL Nebulization Q4H PRN Awilda Bill, NP      . LORazepam (ATIVAN) injection 2 mg  2 mg Intravenous Q6H PRN Saundra Shelling, MD   2 mg at 02/22/17 1759  . MEDLINE mouth rinse  15 mL Mouth Rinse QID Dorene Sorrow S, NP   15 mL at 02/22/17 1800  . metoprolol tartrate (LOPRESSOR) injection 2.5-5 mg  2.5-5 mg Intravenous Q3H PRN Wilhelmina Mcardle, MD   2.5 mg at 02/20/17 1116  . metoprolol tartrate (LOPRESSOR) tablet 25 mg  25 mg Oral BID Saundra Shelling, MD   25 mg at 02/22/17 1003  . multivitamin (RENA-VIT) tablet 1 tablet  1 tablet Oral QHS Fritzi Mandes, MD   1 tablet at 02/21/17 2231  . pantoprazole (PROTONIX) EC tablet 40 mg  40 mg Oral Daily Wilhelmina Mcardle,  MD   40 mg at 02/22/17 1003  . sennosides (SENOKOT) 8.8 MG/5ML syrup 5 mL  5 mL Per Tube BID PRN Tukov, Magadalene S, NP      . sodium chloride flush (NS) 0.9 % injection 10-40 mL  10-40 mL Intracatheter PRN Fritzi Mandes, MD      . tuberculin injection 5 Units  5 Units Intradermal Once Lateef, Munsoor, MD   5 Units at 02/21/17 1402  . valproate (DEPACON) 1,250 mg in dextrose 5 % 50 mL IVPB  1,250 mg Intravenous Q12H Leotis Pain, MD        Musculoskeletal: Strength & Muscle Tone: decreased Gait & Station: unable to stand Patient leans: N/A  Psychiatric Specialty Exam: Physical Exam  Nursing note and vitals reviewed. Constitutional: He appears well-developed and well-nourished.  HENT:  Head: Normocephalic and atraumatic.  Eyes: Pupils are equal, round, and reactive to light. Conjunctivae are normal.  Neck: Normal range of motion.  Cardiovascular: Normal heart sounds.   Respiratory: Effort normal.  GI: Soft.  Genitourinary:     Musculoskeletal: Normal range of motion.  Neurological: He is alert. He displays tremor. He exhibits abnormal muscle tone.  Skin: Skin is warm and dry.  Psychiatric: His affect is blunt. His speech is delayed. He is agitated and slowed. Cognition and memory are impaired. He is noncommunicative.    Review of Systems  Unable to perform ROS: Medical condition    Blood pressure (!) 114/58, pulse (!) 102, temperature 98.1 F (36.7 C), temperature source Axillary, resp. rate 20, height 5' 10"  (1.778 m), weight 80.7 kg (178 lb), SpO2 100 %.Body mass index is 25.54 kg/m.  General Appearance: Disheveled  Eye Contact:  Minimal  Speech:  Garbled  Volume:  Decreased  Mood:  Negative  Affect:  Negative  Thought Process:  NA  Orientation:  Negative  Thought Content:  Negative  Suicidal Thoughts:  No  Homicidal Thoughts:  No  Memory:  Negative  Judgement:  Negative  Insight:  Negative  Psychomotor Activity:  Negative  Concentration:  Concentration:  Negative  Recall:  Negative  Fund of Knowledge:  Negative  Language:  Negative  Akathisia:  Negative  Handed:  Right  AIMS (if indicated):     Assets:  Housing  ADL's:  Impaired  Cognition:  Impaired,  Severe  Sleep:        Treatment Plan Summary: Daily contact with patient to assess and evaluate symptoms and progress in treatment, Medication management and Plan 56 year old man with a history of chronic mental illness. Currently unresponsive. Multiple probable etiologies including infection possible neurologic condition. Can't rule out catatonia although I suspect it would be one factor among many. Patient is not receiving his oral antipsychotics because he is unable to take oral medicine which is maintaining on the one hand that it is not possible that these are causing oversedation but on the other hand it is possible that he could be suffering from a lack of his antipsychotic. At this point I will probably error on the side of not restarting any acute antipsychotic while he is still so medically sick but will sign this out to the doctor on call and continue to follow-up regularly.   Error noted: The patient was receiving oral antipsychotic medication per the [facility] even though prior notes indicate that he was not taking an antipsychotic medication. Will discontinue Seroquel 200 mg by mouth twice a day and Prolixin 5 mg by mouth nightly to see if patient becomes more alert and responsive. We'll then consider mood stabilizers and atypical antipsychotics from that point on. For now, we'll use Ativan and Haldol IV/po prn as needed every 6-8 hours.Please keep the sitter at bedside. He does appear to be experiencing a delirium   Disposition: Patient does not meet criteria for psychiatric inpatient admission. Supportive therapy provided about ongoing stressors.  Jay Schlichter, MD 02/22/2017 6:45 PM

## 2017-02-22 NOTE — Progress Notes (Signed)
[facility] [place] [facility]   PATIENT NAME: Jason Diaz    MR#:  [id number]  DATE OF BIRTH:  [birth year]  SUBJECTIVE:  \ noncommunicative,Resting quietly. Sitter in the room. Resting quietly. Ate 50% of his BF   REVIEW OF SYSTEMS:   Review of Systems  Constitutional: Negative for chills, fever and weight loss.  HENT: Negative for ear discharge, ear pain and nosebleeds.   Eyes: Negative for blurred vision, pain and discharge.  Respiratory: Negative for sputum production, shortness of breath, wheezing and stridor.   Cardiovascular: Negative for chest pain, palpitations, orthopnea and PND.  Gastrointestinal: Negative for abdominal pain, diarrhea, nausea and vomiting.  Genitourinary: Negative for frequency and urgency.  Musculoskeletal: Negative for back pain and joint pain.  Neurological: Positive for weakness. Negative for sensory change, speech change and focal weakness.  Psychiatric/Behavioral: Negative for depression and hallucinations. The patient is not nervous/anxious.    Tolerating Diet: renal diet Tolerating PT: pending  DRUG ALLERGIES:  No Known Allergies  VITALS:  Blood pressure (!) 114/58, pulse (!) 102, temperature 98.1 F (36.7 C), temperature source Axillary, resp. rate 20, height 5\' 10"  (1.778 m), weight 80.7 kg (178 lb), SpO2 100 %.  PHYSICAL EXAMINATION:   Physical Exam  GENERAL:  56 y.o.-year-old patient lying in the bed with no acute distress. Chronically ill EYES: Pupils equal, round, reactive to light and accommodation. No scleral icterus. Extraocular muscles intact.  HEENT: Head atraumatic, normocephalic. Oropharynx and nasopharynx clear. NECK:  Supple, no jugular venous distention. No thyroid enlargement, no tenderness.  LUNGS: Normal breath sounds bilaterally, no wheezing, rales, rhonchi. No use of accessory muscles of respiration.  CARDIOVASCULAR: S1, S2 normal. No murmurs, rubs, or gallops.  ABDOMEN: Soft,  nontender, nondistended. Bowel sounds present. No organomegaly or mass.  EXTREMITIES: No cyanosis, clubbing or edema b/l.    NEUROLOGIC:moves extremities well spontaneously PSYCHIATRIC: Resting SKIN: No obvious rash, lesion, or ulcer.   LABORATORY PANEL:  CBC  Recent Labs Lab 02/20/17 1001  WBC 20.3*  HGB 8.8*  HCT 26.5*  PLT 306    Chemistries   Recent Labs Lab 02/16/17 1710  02/17/17 0500  02/21/17 1518  NA 133*  --  138  < > 146*  K 7.5*  < > 3.5  < > 2.7*  CL 102  --  97*  < > 109  CO2 11*  --  27  < > 28  GLUCOSE 137*  --  115*  < > 109*  BUN 134*  --  71*  < > 52*  CREATININE 15.14*  --  8.39*  < > 5.85*  CALCIUM 9.5  --  8.3*  < > 9.8  MG  --   --  1.9  --   --   AST 28  --   --   --   --   ALT 22  --   --   --   --   ALKPHOS 109  --   --   --   --   BILITOT 1.0  --   --   --   --   < > = values in this interval not displayed. Cardiac Enzymes  Recent Labs Lab 02/16/17 1615  TROPONINI 0.04*   RADIOLOGY:  Ct Head Wo Contrast  Result Date: 02/21/2017 CLINICAL DATA:  Post fall on July 15th.  Patient now with lethargy. EXAM: CT HEAD WITHOUT CONTRAST TECHNIQUE: Contiguous axial images were obtained from the base of the skull through  the vertex without intravenous contrast. COMPARISON:  02/16/2017 FINDINGS: Brain: Similar findings of age advanced atrophy with sulcal prominence and and centralized volume loss with ex vacuo dilatation of the ventricular system. Unchanged scattered periventricular hypodensities compatible with microvascular ischemic disease. The gray-white differentiation is otherwise well maintained without CT evidence of acute large territory infarct. No intraparenchymal or extra-axial mass or hemorrhage. Unchanged size and configuration the ventricles and the basilar cisterns. No midline shift Vascular: No hyperdense vessel or unexpected calcification. Skull: No displaced calvarial fracture. Sinuses/Orbits: Underpneumatization of the bilateral frontal  sinuses. The remaining paranasal sinuses and mastoid air cells are normally aerated. Other: Regional soft tissues appear normal. IMPRESSION: Similar findings of age advanced atrophy and mild microvascular ischemic disease without acute intracranial process. Electronically Signed   By: Simonne Come M.D.   On: 02/21/2017 14:32   ASSESSMENT AND PLAN:  Jason Diaz  is a 56 y.o. male with a known history of Chronic kidney disease stage IV baseline creatinine around 3.4, GERD, schizoaffective disorder., Hypertension comes to the emergency room from [facility] after patient slipping in his urine causing him to fall and hitting his head on the nightstand. Staff noted shaking questionable seizure received 4 mg of Versed. Patient was quite confused came to the emergency room and was found to be in septic shock. His white count was elevated to 17,000 was tachycardic and creatinine was 15.4 with potassium of 7.5.  1. Acute metabolic encephalopathy multifactorial secondary to septic shock, acute on chronic severe renal failure with dehydration -Patient was intubated placed on the ventilator---extubated 02/18/17 -Continue to monitor for seizure activity. -Appreciate neurology consultation. Patient on IV Depakote---can we change to oral depakote  2. Septic shock source appears urine -Patient was discharged 01/28/2017 with an indwelling Foley catheter given significant hematuria and difficulty urination. He was supposed to follow with urology not sure if he made to the appointment -IV cefepime.Unable to change to oral meds since patient is noncompliant with oral meds. Will continue IV antibiotics - urine culture growing more than 100,000 citrobacter and pseudomonas -blood culture--->  negative.  3. Acute on chronic kidney failure stage IV/5 -Baseline creatinine 3.35------ came in with creatinine of 15.49--- continue hemodialysis---7.27-- 8.08 -Nephrology consultation appreciated. -patient to be  dialyzed today.  4. Acute severe hyperkalemia -Patient received IV bicarbonate, dextrose with insulin and calcium gluconate -Follow up potassium with dialysis -Came in with potassium of 7.5----3.5  5. Leukocytosis due to #2  6. DVT prophylaxis subcutaneous heparin  7. Possible seizures versus jerky/tremor movements and to uremic encephalopathy -Empirically on IV depakote -Appreciate neurology input. -EEG done and showing triphasic activity waveforms---cont anti seizure meds  8. Acute delirium/agitation with history of schizoaffective disorder -psychiatry consultation noted. No new recommendations noted.  Patient has been having periods of agitation and restlessness  And being noncompliant with his meds. Palliative consultation appreciated. They spoke with patient's mother Jason Diaz. Patient is a full code.  Social worker for discharge planning  Case discussed with Care Management/Social Worker. Management plans discussed with the patient, family and they are in agreement.  CODE STATUS: full  DVT Prophylaxis: heparin  TOTAL TIME TAKING CARE OF THIS PATIENT: *30* minutes.  >50% time spent on counselling and coordination of care    Note: This dictation was prepared with Dragon dictation along with smaller phrase technology. Any transcriptional errors that result from this process are unintentional.  Ninette Cotta M.D on 02/22/2017 at 1:34 PM  Between 7am to 6pm - Pager - [phone]  After 6pm go  to [email or web address] - Social research officer, governmentpassword [email or web address]  [facility]  Office  [phone]  CC: Primary care physician; Armando GangLindley, Cheryl P, FNP

## 2017-02-22 NOTE — Progress Notes (Signed)
SLP Cancellation Note  Patient Details Name: Jason Diaz MRN: [id number] DOB: November 17, 1960   Cancelled treatment:       Reason Eval/Treat Not Completed: Fatigue/lethargy limiting ability to participate. Reviewed chart and spoke w/nsg and sitter in pt's room. Pt given ativan this morning and remains somnolent. Pt's sitter reports she has not attempted giving pt any PO's d/t his lethargy. Recommend continue w/current modified diet of dysphagia I w/nectar d/t pt's declined alertness level. Do not attempt PO intake when pt is lethargic. Will f/u in 1-2 days.    [place],Kadisha Goodine 02/22/2017, 11:17 AM

## 2017-02-23 LAB — BASIC METABOLIC PANEL
Anion gap: 11 (ref 5–15)
BUN: 66 mg/dL — AB (ref 6–20)
CALCIUM: 11.5 mg/dL — AB (ref 8.9–10.3)
CHLORIDE: 115 mmol/L — AB (ref 101–111)
CO2: 26 mmol/L (ref 22–32)
CREATININE: 7.46 mg/dL — AB (ref 0.61–1.24)
GFR calc Af Amer: 9 mL/min — ABNORMAL LOW (ref >60)
GFR calc non Af Amer: 7 mL/min — ABNORMAL LOW (ref >60)
Glucose, Bld: 116 mg/dL — ABNORMAL HIGH (ref 65–99)
Potassium: 3.5 mmol/L (ref 3.5–5.1)
SODIUM: 152 mmol/L — AB (ref 135–145)

## 2017-02-23 LAB — VALPROIC ACID LEVEL: Valproic Acid Lvl: 110 ug/mL — ABNORMAL HIGH (ref 50.0–100.0)

## 2017-02-23 MED ORDER — QUETIAPINE FUMARATE 25 MG PO TABS
100.0000 mg | ORAL_TABLET | Freq: Every day | ORAL | Status: DC
  Administered 2017-02-23 – 2017-02-26 (×3): 100 mg via ORAL
  Filled 2017-02-23 (×3): qty 4

## 2017-02-23 NOTE — Plan of Care (Signed)
Problem: Education: Goal: Knowledge of [place] [facility] Education information/materials will improve Outcome: Not Progressing Pt confused  Problem: Health Behavior/Discharge Planning: Goal: Ability to manage health-related needs will improve Outcome: Not Progressing Pt confused     

## 2017-02-23 NOTE — Progress Notes (Deleted)
02/24/2017 3:45 PM   Jason Diaz [birth year] [id number]  Referring provider: Armando Gang, FNP [address] [place], [place] [address]  No chief complaint on file.   HPI: Patient is a 56 year old *** male who presents today for follow-up after being hospitalized for acute renal failure and found to have 1900 cc in his bladder for which a Foley catheter was placed.  He was seen during his hospitalization in June 2018 by urology.  He was prescribed tamsulosin at that time and was instructed to be discharged on the tamsulosin and Foley in place and to follow up with urology for a TOV.  Today, ***.     PMH: Past Medical History:  Diagnosis Date  . CKD (chronic kidney disease), stage IV (HCC)   . COPD (chronic obstructive pulmonary disease) (HCC)   . GERD (gastroesophageal reflux disease)   . Hypertension   . Multinodular goiter   . Schizoaffective disorder [facility])     Surgical History: Past Surgical History:  Procedure Laterality Date  . TONSILLECTOMY    . TOOTH EXTRACTION      Home Medications:  Allergies as of 02/24/2017   No Known Allergies     Medication List    Notice   This visit is during an admission. Changes to the med list made in this visit will be reflected in the After Visit Summary of the admission.     Allergies: No Known Allergies  Family History: Family History  Problem Relation Age of Onset  . Kidney disease Father   . Diabetes Father     Social History:  reports that he has been smoking Cigarettes.  He has been smoking about 0.25 packs per day. He has never used smokeless tobacco. He reports that he does not drink alcohol or use drugs.  ROS:                                        Physical Exam: There were no vitals taken for this visit.  Constitutional: Well nourished. Alert and oriented, No acute distress. HEENT: [place] AT, moist mucus membranes. Trachea midline, no masses. Cardiovascular: No  clubbing, cyanosis, or edema. Respiratory: Normal respiratory effort, no increased work of breathing. GI: Abdomen is soft, non tender, non distended, no abdominal masses. Liver and spleen not palpable.  No hernias appreciated.  Stool sample for occult testing is not indicated.   GU: No CVA tenderness.  No bladder fullness or masses.  Patient with circumcised/uncircumcised phallus. ***Foreskin easily retracted***  Urethral meatus is patent.  No penile discharge. No penile lesions or rashes. Scrotum without lesions, cysts, rashes and/or edema.  Testicles are located scrotally bilaterally. No masses are appreciated in the testicles. Left and right epididymis are normal. Rectal: Patient with  normal sphincter tone. Anus and perineum without scarring or rashes. No rectal masses are appreciated. Prostate is approximately *** grams, *** nodules are appreciated. Seminal vesicles are normal. Skin: No rashes, bruises or suspicious lesions. Lymph: No cervical or inguinal adenopathy. Neurologic: Grossly intact, no focal deficits, moving all 4 extremities. Psychiatric: Normal mood and affect.  Laboratory Data: Lab Results  Component Value Date   WBC 20.3 (H) 02/20/2017   HGB 8.8 (L) 02/20/2017   HCT 26.5 (L) 02/20/2017   MCV 87.2 02/20/2017   PLT 306 02/20/2017    Lab Results  Component Value Date   CREATININE 7.46 (H) 02/23/2017  Lab Results  Component Value Date   TSH 0.079 (L) 02/16/2017    Lab Results  Component Value Date   AST 28 02/16/2017   Lab Results  Component Value Date   ALT 22 02/16/2017    I have independently reviewed the labs.  Pertinent Imaging: *** I have independently reviewed the films.    Assessment & Plan:  ***  1. Acute urinary retention:     - foley catheter removed  -voiding trial today    -return if unable to urinate or experiencing suprapubic discomfort  -follow-up in one month for I PSS score, PVR and exam.   No Follow-up on file.  These  notes generated with voice recognition software. I apologize for typographical errors.  Michiel CowboySHANNON Cheris Tweten, PA-C  [facility] [address], [address] [place], [place] [address] [phone]

## 2017-02-23 NOTE — Progress Notes (Signed)
[facility] [place] [facility]   PATIENT NAME: Jason Diaz    MR#:  [id number]  DATE OF BIRTH:  01-08-1961  SUBJECTIVE:  \ noncommunicative,Resting quietly. Sitter in the room. Resting quietly. Has not eaten much. He is drinking some thickened liquid   REVIEW OF SYSTEMS:   Review of Systems  Constitutional: Negative for chills, fever and weight loss.  HENT: Negative for ear discharge, ear pain and nosebleeds.   Eyes: Negative for blurred vision, pain and discharge.  Respiratory: Negative for sputum production, shortness of breath, wheezing and stridor.   Cardiovascular: Negative for chest pain, palpitations, orthopnea and PND.  Gastrointestinal: Negative for abdominal pain, diarrhea, nausea and vomiting.  Genitourinary: Negative for frequency and urgency.  Musculoskeletal: Negative for back pain and joint pain.  Neurological: Positive for weakness. Negative for sensory change, speech change and focal weakness.  Psychiatric/Behavioral: Negative for depression and hallucinations. The patient is not nervous/anxious.    Tolerating Diet: renal diet Tolerating PT: pending  DRUG ALLERGIES:  No Known Allergies  VITALS:  Blood pressure (!) 155/85, pulse (!) 114, temperature 98.6 F (37 C), temperature source Oral, resp. rate (!) 22, height 5\' 10"  (1.778 m), weight 80.7 kg (178 lb), SpO2 100 %.  PHYSICAL EXAMINATION:   Physical Exam  GENERAL:  56 y.o.-year-old patient lying in the bed with no acute distress. Chronically ill EYES: Pupils equal, round, reactive to light and accommodation. No scleral icterus. Extraocular muscles intact.  HEENT: Head atraumatic, normocephalic. Oropharynx and nasopharynx clear. NECK:  Supple, no jugular venous distention. No thyroid enlargement, no tenderness.  LUNGS: Normal breath sounds bilaterally, no wheezing, rales, rhonchi. No use of accessory muscles of respiration.  CARDIOVASCULAR: S1, S2 normal. No murmurs, rubs,  or gallops.  ABDOMEN: Soft, nontender, nondistended. Bowel sounds present. No organomegaly or mass.  EXTREMITIES: No cyanosis, clubbing or edema b/l.    NEUROLOGIC:moves extremities well spontaneously PSYCHIATRIC: Resting SKIN: No obvious rash, lesion, or ulcer.   LABORATORY PANEL:  CBC  Recent Labs Lab 02/20/17 1001  WBC 20.3*  HGB 8.8*  HCT 26.5*  PLT 306    Chemistries   Recent Labs Lab 02/16/17 1710  02/17/17 0500  02/23/17 1013  NA 133*  --  138  < > 152*  K 7.5*  < > 3.5  < > 3.5  CL 102  --  97*  < > 115*  CO2 11*  --  27  < > 26  GLUCOSE 137*  --  115*  < > 116*  BUN 134*  --  71*  < > 66*  CREATININE 15.14*  --  8.39*  < > 7.46*  CALCIUM 9.5  --  8.3*  < > 11.5*  MG  --   --  1.9  --   --   AST 28  --   --   --   --   ALT 22  --   --   --   --   ALKPHOS 109  --   --   --   --   BILITOT 1.0  --   --   --   --   < > = values in this interval not displayed. Cardiac Enzymes  Recent Labs Lab 02/16/17 1615  TROPONINI 0.04*   RADIOLOGY:  Ct Head Wo Contrast  Result Date: 02/21/2017 CLINICAL DATA:  Post fall on July 15th.  Patient now with lethargy. EXAM: CT HEAD WITHOUT CONTRAST TECHNIQUE: Contiguous axial images were obtained from  the base of the skull through the vertex without intravenous contrast. COMPARISON:  02/16/2017 FINDINGS: Brain: Similar findings of age advanced atrophy with sulcal prominence and and centralized volume loss with ex vacuo dilatation of the ventricular system. Unchanged scattered periventricular hypodensities compatible with microvascular ischemic disease. The gray-white differentiation is otherwise well maintained without CT evidence of acute large territory infarct. No intraparenchymal or extra-axial mass or hemorrhage. Unchanged size and configuration the ventricles and the basilar cisterns. No midline shift Vascular: No hyperdense vessel or unexpected calcification. Skull: No displaced calvarial fracture. Sinuses/Orbits:  Underpneumatization of the bilateral frontal sinuses. The remaining paranasal sinuses and mastoid air cells are normally aerated. Other: Regional soft tissues appear normal. IMPRESSION: Similar findings of age advanced atrophy and mild microvascular ischemic disease without acute intracranial process. Electronically Signed   By: Simonne Come M.D.   On: 02/21/2017 14:32   ASSESSMENT AND PLAN:  Jason Diaz  is a 56 y.o. male with a known history of Chronic kidney disease stage IV baseline creatinine around 3.4, GERD, schizoaffective disorder., Hypertension comes to the emergency room from [facility] after patient slipping in his urine causing him to fall and hitting his head on the nightstand. Staff noted shaking questionable seizure received 4 mg of Versed. Patient was quite confused came to the emergency room and was found to be in septic shock. His white count was elevated to 17,000 was tachycardic and creatinine was 15.4 with potassium of 7.5.  1. Acute metabolic encephalopathy multifactorial secondary to septic shock, acute on chronic severe renal failure with dehydration -Patient was intubated placed on the ventilator---extubated 02/18/17 -Continue to monitor for seizure activity. -Appreciate neurology consultation. Patient on IV Depakote---can we change to oral depakote  2. Septic shock source appears urine -Patient was discharged 01/28/2017 with an indwelling Foley catheter given significant hematuria and difficulty urination. He was supposed to follow with urology not sure if he made to the appointment -IV cefepime.Unable to change to oral meds since patient is noncompliant with oral meds. Will continue IV antibiotics - urine culture growing more than 100,000 citrobacter and pseudomonas -blood culture--->  negative.  3. Acute on chronic kidney failure stage IV/5 -Baseline creatinine 3.35------ came in with creatinine of 15.49--- continue hemodialysis---7.27-- 8.08 -Nephrology  consultation appreciated. -patient to be dialyzed today.  4. Acute severe hyperkalemia -Patient received IV bicarbonate, dextrose with insulin and calcium gluconate -Follow up potassium with dialysis -Came in with potassium of 7.5----3.5  5. Leukocytosis due to #2  6. DVT prophylaxis subcutaneous heparin  7. Possible seizures versus jerky/tremor movements and to uremic encephalopathy -Empirically on IV depakote -Appreciate neurology input. -EEG done and showing triphasic activity waveforms---cont anti seizure meds  8. Acute delirium/agitation with history of schizoaffective disorder -psychiatry consultation by Dr Toni Amend -Spoke with Dr. Maryruth Bun who recommends to hold off Prolixin and Seroquel to see patient wakes up.  Patient has been having periods of agitation and restlessness  And being noncompliant with his meds. Palliative consultation appreciated. They spoke with patient's mother Karena Addison. Patient is a full code.  Social worker for discharge planning  Case discussed with Care Management/Social Worker. Management plans discussed with the patient, family and they are in agreement.  CODE STATUS: full  DVT Prophylaxis: heparin  TOTAL TIME TAKING CARE OF THIS PATIENT: *25* minutes.  >50% time spent on counselling and coordination of care    Note: This dictation was prepared with Dragon dictation along with smaller phrase technology. Any transcriptional errors that result from this process are unintentional.  Deloria Brassfield M.D on 02/23/2017 at 1:30 PM  Between 7am to 6pm - Pager - [phone]  After 6pm go to [email or web address] - Social research officer, governmentpassword [email or web address] [email or web address]  [facility]  Office  [phone]  CC: Primary care physician; Armando GangLindley, Cheryl P, FNP

## 2017-02-23 NOTE — Progress Notes (Signed)
Subjective: Patient lethargic. Eyes open today and appears to be tracking  Objective: Current vital signs: BP (!) 163/89 (BP Location: Left Arm)   Pulse (!) 108   Temp 98.6 F (37 C) (Oral)   Resp 16   Ht _0  (1.778 m)   Wt 80.7 kg (178 lb)   SpO2 99%   BMI 25.54 kg/m  Vital signs in last 24 hours: Temp:  [98.6 F (37 C)] 98.6 F (37 C) (07/22 1446) Pulse Rate:  [108-114] 108 (07/22 1446) Resp:  [16-22] 16 (07/22 1446) BP: (154-163)/(85-97) 163/89 (07/22 1446) SpO2:  [99 %-100 %] 99 % (07/22 1446)  Intake/Output from previous day: 07/21 0701 - 07/22 0700 In: 1901 [I.V.:1851; IV Piggyback:50] Out: 2750 [Urine:2750] Intake/Output this shift: Total I/O In: 225 [I.V.:225] Out: 1700 [Urine:1700] Nutritional status: DIET - DYS 1 Room service appropriate? Yes with Assist; Fluid consistency: Nectar Thick  Neurologic Exam: Mental Status: Patient lethargic. Eyes open today and appears to be tracking  Cranial Nerves: II: Pupils equal, round, reactive to light and accommodation III,IV, VI: Intact oculocephalic maneuvers V,VII: corneals intactbilaterally VIII: unable to be tested IX,X: gag reflex present XI: unable to be tested XII: unable to be tested Motor: Withdraws to painful stimuli b/l  Sensory: Responds to noxious stimuli throughout  Lab Results: Basic Metabolic Panel:  Recent Labs Lab 02/17/17 0500 02/19/17 0857 02/20/17 1001 02/21/17 1518 02/23/17 1013  NA 138 144 150* 146* 152*  K 3.5 3.2* 3.1* 2.7* 3.5  CL 97* 103 109 109 115*  CO2 _1 GLUCOSE 115* 71 85 109* 116*  BUN 71* 59* 65* 52* 66*  CREATININE 8.39* 7.27* 8.08* 5.85* 7.46*  CALCIUM 8.3* 10.2 10.8* 9.8 11.5*  MG 1.9  --   --   --   --   PHOS 6.8*  --   --  4.1  --     Liver Function Tests:  Recent Labs Lab 02/16/17 1615 02/16/17 1710  AST UNABLE TO REPORT DUE TO HEMOLYSIS 28  ALT 18 22  ALKPHOS 114 109  BILITOT 1.5* 1.0  PROT 8.2* 7.9  ALBUMIN 3.1* 3.0*   No  results for input(s): LIPASE, AMYLASE in the last 168 hours. No results for input(s): AMMONIA in the last 168 hours.  CBC:  Recent Labs Lab 02/16/17 1615 02/17/17 0500 02/20/17 1001  WBC 17.6* 12.2* 20.3*  NEUTROABS 16.7*  --   --   HGB 10.0* 8.0* 8.8*  HCT 29.6* 23.2* 26.5*  MCV 88.8 85.9 87.2  PLT 398 304 306    Cardiac Enzymes:  Recent Labs Lab 02/16/17 1615  TROPONINI 0.04*    Lipid Panel:  Recent Labs Lab 02/16/17 2051  TRIG 265*    CBG:  Recent Labs Lab 02/17/17 2044 02/18/17 0006 02/18/17 0408 02/18/17 0728 02/18/17 1141  GLUCAP 77 102* 88 95 76    Microbiology: Results for orders placed or performed during the hospital encounter of 02/16/17  Urine Culture     Status: Abnormal   Collection Time: 02/16/17  4:15 PM  Result Value Ref Range Status   Specimen Description URINE, RANDOM  Final   Special Requests NONE  Final   Culture (A)  Final    >=100,000 COLONIES/mL CITROBACTER KOSERI 50,000 COLONIES/mL PSEUDOMONAS AERUGINOSA    Report Status 02/19/2017 FINAL  Final   Organism ID, Bacteria CITROBACTER KOSERI (A)  Final   Organism ID, Bacteria PSEUDOMONAS AERUGINOSA (A)  Final      Susceptibility   Citrobacter  koseri - MIC*    CEFAZOLIN <=4 SENSITIVE Sensitive     CEFTRIAXONE <=1 SENSITIVE Sensitive     CIPROFLOXACIN <=0.25 SENSITIVE Sensitive     GENTAMICIN <=1 SENSITIVE Sensitive     IMIPENEM <=0.25 SENSITIVE Sensitive     NITROFURANTOIN 32 SENSITIVE Sensitive     TRIMETH/SULFA <=20 SENSITIVE Sensitive     PIP/TAZO <=4 SENSITIVE Sensitive     * >=100,000 COLONIES/mL CITROBACTER KOSERI   Pseudomonas aeruginosa - MIC*    CEFTAZIDIME 4 SENSITIVE Sensitive     CIPROFLOXACIN <=0.25 SENSITIVE Sensitive     GENTAMICIN <=1 SENSITIVE Sensitive     IMIPENEM 1 SENSITIVE Sensitive     PIP/TAZO 8 SENSITIVE Sensitive     CEFEPIME 2 SENSITIVE Sensitive     * 50,000 COLONIES/mL PSEUDOMONAS AERUGINOSA  Culture, blood (Routine X 2) w Reflex to ID Panel      Status: None   Collection Time: 02/16/17  8:42 PM  Result Value Ref Range Status   Specimen Description BLOOD LEFT ARM  Final   Special Requests   Final    BOTTLES DRAWN AEROBIC AND ANAEROBIC Blood Culture adequate volume   Culture NO GROWTH 5 DAYS  Final   Report Status 02/21/2017 FINAL  Final  Culture, blood (Routine X 2) w Reflex to ID Panel     Status: None   Collection Time: 02/16/17  8:52 PM  Result Value Ref Range Status   Specimen Description BLOOD LEFT HAND  Final   Special Requests   Final    BOTTLES DRAWN AEROBIC AND ANAEROBIC Blood Culture adequate volume   Culture NO GROWTH 5 DAYS  Final   Report Status 02/21/2017 FINAL  Final  MRSA PCR Screening     Status: None   Collection Time: 02/16/17  9:00 PM  Result Value Ref Range Status   MRSA by PCR NEGATIVE NEGATIVE Final    Comment:        The GeneXpert MRSA Assay (FDA approved for NASAL specimens only), is one component of a comprehensive MRSA colonization surveillance program. It is not intended to diagnose MRSA infection nor to guide or monitor treatment for MRSA infections.     Coagulation Studies: No results for input(s): LABPROT, INR in the last 72 hours.  Imaging: No results found.  Medications:  I have reviewed the patient's current medications. Scheduled: . chlorhexidine gluconate (MEDLINE KIT)  15 mL Mouth Rinse BID  . diltiazem  30 mg Oral Q6H  . feeding supplement (NEPRO CARB STEADY)  237 mL Oral TID BM  . heparin  5,000 Units Subcutaneous Q8H  . mouth rinse  15 mL Mouth Rinse QID  . metoprolol tartrate  25 mg Oral BID  . multivitamin  1 tablet Oral QHS  . pantoprazole  40 mg Oral Daily  . QUEtiapine  100 mg Oral QHS    Assessment/Plan: Patient is moving all his extremities  Unclear etiology although may be related to metabolic abnormalities from poor renal function and or medications.  Creat is 7.46 but good urine out put  [facility] repeat no acute abnormalities seroquel has been d/c   VPA  level of  110 down 118 upon decreasing dose yesterday.  Since slightly more alert today will not change dose further and observe.    Leotis Pain  02/23/2017  3:42 PM

## 2017-02-23 NOTE — Consult Note (Signed)
[facility] Face-to-Face Psychiatry Consult   Reason for Consult:  Consult for 56 year old man with a history of schizophrenia who was brought into the hospital after a fall and injury and then required intubation. Referring Physician:  Posey Pronto Patient Identification: Jason Diaz MRN:  [id number] Principal Diagnosis: Acute delirium Diagnosis:   Patient Active Problem List   Diagnosis Date Noted  . Acute renal failure superimposed on chronic kidney disease ([place]) [N17.9, N18.9]   . Palliative care by specialist [Z51.5]   . Goals of care, counseling/discussion [Z71.89]   . Acute delirium [R41.0] 02/19/2017  . Acute respiratory failure ([place]) [J96.00]   . Seizure-like activity ([place]) [R56.9]   . Sepsis ([place]) [A41.9]   . Septic shock ([place]) [A41.9, R65.21] 02/16/2017  . Schizoaffective disorder ([place]) [F25.9] 02/16/2017  . Hypercalcemia [E83.52] 01/27/2017  . Hematuria [R31.9] 01/27/2017  . Acute urinary retention [R33.8] 01/27/2017  . Hyponatremia [E87.1] 01/27/2017  . Hypokalemia [E87.6] 01/27/2017  . ARF (acute renal failure) ([place]) [N17.9] 01/23/2017    Total Time spent with patient: 20 minutes  Subjective:   Jason Diaz is a 56 y.o. male patient admitted with patient is not able to communicate.   02/22/17:The patient remained sedated and is nonresponsive to this Probation officer. Per the sitter at the bedside, he has been mumbling at times. No seizure activity noted. He has been taking some medications. Although prior notes indicate that he is not on any psychotropic medications, according to the [facility] the patient was getting Seroquel 200 mg by mouth twice a day and Prolixin 5 mg by mouth nightly.  02/23/17: The patient has been more alert today but is not interacting with this Probation officer. The sitter has been at the bedside and says that he has fairly calm. He has only received 1 when necessary Ativan today and no prn Haldol. VPA level was toxic at 110. The group home supervisor was at the bedside and was  in the process of faxing the [facility] to the hospital.    HPI:  Attempted to interview patient. Reviewed old chart. This is a 56 year old man who carries a diagnosis of schizophrenia or schizoaffective disorder and who lives in a group home. He was brought to the hospital after reportedly having a fall and hitting his head. He was thought to have some seizure-like activity but was also found to be an acute worsening renal failure. Patient was intubated and was in the ICU briefly and has now been extubated and moved to the floor. Patient on evaluation had his eyes open and appeared to be awake but was not able to verbally interact. He was able to answer yes to a couple of questions but was not able to say anything else and was not able to follow direct commands or say his name. Affect flat and somewhat confused.  Social history: According to the chart he lives in a group home or assisted living type setting. He reports that they're his family at times that might be involved but the closest communication seems to be with the manager of the facility.  Medical history: Chronic renal failure which has worsened in the hospital. Possible new seizure activity.  Substance abuse history: No information  Past Psychiatric History: We don't have any previous psychiatric information other than the list of medicines that he was on. It looks like he was on quite a bit of psychiatric medicine including 3 antipsychotics between the Seroquel risperdal and Prolixin. We don't have any record of prior hospitalizations or symptoms at baseline.  Risk to Self: Is patient at risk for suicide?: No Risk to Others:   Prior Inpatient Therapy:   Prior Outpatient Therapy:    Past Medical History:  Past Medical History:  Diagnosis Date  . CKD (chronic kidney disease), stage IV ([place])   . COPD (chronic obstructive pulmonary disease) ([place])   . GERD (gastroesophageal reflux disease)   . Hypertension   . Multinodular goiter   .  Schizoaffective disorder [facility])     Past Surgical History:  Procedure Laterality Date  . TONSILLECTOMY    . TOOTH EXTRACTION     Family History:  Family History  Problem Relation Age of Onset  . Kidney disease Father   . Diabetes Father    Family Psychiatric  History: Unknown Social History:  History  Alcohol Use No     History  Drug Use No    Social History   Social History  . Marital status: Single    Spouse name: N/A  . Number of children: N/A  . Years of education: N/A   Social History Main Topics  . Smoking status: Current Every Day Smoker    Packs/day: 0.25    Types: Cigarettes  . Smokeless tobacco: Never Used  . Alcohol use No  . Drug use: No  . Sexual activity: Not Asked   Other Topics Concern  . None   Social History Narrative   From [facility] group [facility]       Allergies:  No Known Allergies  Labs:  Results for orders placed or performed during the hospital encounter of 02/16/17 (from the past 48 hour(s))  Phosphorus     Status: None   Collection Time: 02/21/17  3:18 PM  Result Value Ref Range   Phosphorus 4.1 2.5 - 4.6 mg/dL  Basic metabolic panel     Status: Abnormal   Collection Time: 02/21/17  3:18 PM  Result Value Ref Range   Sodium 146 (H) 135 - 145 mmol/L   Potassium 2.7 (LL) 3.5 - 5.1 mmol/L    Comment: CRITICAL RESULT CALLED TO, READ BACK BY AND VERIFIED WITH STEVE IMHOFF AT 1633 ON 02/21/17 BY SNJ    Chloride 109 101 - 111 mmol/L   CO2 28 22 - 32 mmol/L   Glucose, Bld 109 (H) 65 - 99 mg/dL   BUN 52 (H) 6 - 20 mg/dL   Creatinine, Ser 5.85 (H) 0.61 - 1.24 mg/dL   Calcium 9.8 8.9 - 10.3 mg/dL   GFR calc non Af Amer 10 (L) >60 mL/min   GFR calc Af Amer 12 (L) >60 mL/min    Comment: (NOTE) The eGFR has been calculated using the CKD EPI equation. This calculation has not been validated in all clinical situations. eGFR's persistently <60 mL/min signify possible Chronic Kidney Disease.    Anion gap 9 5 - 15  Valproic acid  level     Status: Abnormal   Collection Time: 02/22/17  5:27 AM  Result Value Ref Range   Valproic Acid Lvl 118 (H) 50.0 - 100.0 ug/mL  Valproic acid level     Status: Abnormal   Collection Time: 02/23/17  4:17 AM  Result Value Ref Range   Valproic Acid Lvl 110 (H) 50.0 - 100.0 ug/mL  Basic metabolic panel     Status: Abnormal   Collection Time: 02/23/17 10:13 AM  Result Value Ref Range   Sodium 152 (H) 135 - 145 mmol/L   Potassium 3.5 3.5 - 5.1 mmol/L   Chloride 115 (H) 101 -  111 mmol/L   CO2 26 22 - 32 mmol/L   Glucose, Bld 116 (H) 65 - 99 mg/dL   BUN 66 (H) 6 - 20 mg/dL   Creatinine, Ser 7.46 (H) 0.61 - 1.24 mg/dL   Calcium 11.5 (H) 8.9 - 10.3 mg/dL   GFR calc non Af Amer 7 (L) >60 mL/min   GFR calc Af Amer 9 (L) >60 mL/min    Comment: (NOTE) The eGFR has been calculated using the CKD EPI equation. This calculation has not been validated in all clinical situations. eGFR's persistently <60 mL/min signify possible Chronic Kidney Disease.    Anion gap 11 5 - 15    Current Facility-Administered Medications  Medication Dose Route Frequency Provider Last Rate Last Dose  . acetaminophen (TYLENOL) tablet 650 mg  650 mg Oral Q6H PRN Fritzi Mandes, MD       Or  . acetaminophen (TYLENOL) suppository 650 mg  650 mg Rectal Q6H PRN Fritzi Mandes, MD   650 mg at 02/19/17 2034  . bisacodyl (DULCOLAX) suppository 10 mg  10 mg Rectal Daily PRN Tukov, Magadalene S, NP      . budesonide (PULMICORT) nebulizer solution 0.25 mg  0.25 mg Nebulization BID PRN Awilda Bill, NP      . chlorhexidine gluconate (MEDLINE KIT) (PERIDEX) 0.12 % solution 15 mL  15 mL Mouth Rinse BID Tukov, Magadalene S, NP   15 mL at 02/23/17 1410  . dextrose 5 % with KCl 20 mEq / L  infusion  20 mEq Intravenous Continuous Lateef, Munso[place], MD 75 mL/hr at 02/23/17 1022 20 mEq at 02/23/17 1022  . diltiazem (CARDIZEM) tablet 30 mg  30 mg Oral Q6H Fritzi Mandes, MD   30 mg at 02/23/17 1023  . feeding supplement (NEPRO CARB  STEADY) liquid 237 mL  237 mL Oral TID BM Fritzi Mandes, MD   237 mL at 02/22/17 1002  . haloperidol (HALDOL) 2 MG/ML solution 2 mg  2 mg Oral Q8H PRN Chauncey Mann, MD      . haloperidol lactate (HALDOL) injection 1 mg  1 mg Intravenous Q6H PRN Chauncey Mann, MD      . heparin injection 5,000 Units  5,000 Units Subcutaneous Q8H Fritzi Mandes, MD   5,000 Units at 02/23/17 1409  . hydrALAZINE (APRESOLINE) injection 10-20 mg  10-20 mg Intravenous Q4H PRN Wilhelmina Mcardle, MD   10 mg at 02/19/17 1539  . ipratropium-albuterol (DUONEB) 0.5-2.5 (3) MG/3ML nebulizer solution 3 mL  3 mL Nebulization Q4H PRN Awilda Bill, NP      . LORazepam (ATIVAN) injection 2 mg  2 mg Intravenous Q6H PRN Saundra Shelling, MD   2 mg at 02/23/17 1023  . MEDLINE mouth rinse  15 mL Mouth Rinse QID Dorene Sorrow S, NP   15 mL at 02/23/17 1410  . metoprolol tartrate (LOPRESSOR) injection 2.5-5 mg  2.5-5 mg Intravenous Q3H PRN Wilhelmina Mcardle, MD   2.5 mg at 02/20/17 1116  . metoprolol tartrate (LOPRESSOR) tablet 25 mg  25 mg Oral BID Saundra Shelling, MD   25 mg at 02/23/17 1023  . multivitamin (RENA-VIT) tablet 1 tablet  1 tablet Oral QHS Fritzi Mandes, MD   1 tablet at 02/22/17 2209  . pantoprazole (PROTONIX) EC tablet 40 mg  40 mg Oral Daily Wilhelmina Mcardle, MD   40 mg at 02/23/17 1023  . QUEtiapine (SEROQUEL) tablet 100 mg  100 mg Oral QHS Chauncey Mann, MD      .  sennosides (SENOKOT) 8.8 MG/5ML syrup 5 mL  5 mL Per Tube BID PRN Tukov, Magadalene S, NP      . sodium chloride flush (NS) 0.9 % injection 10-40 mL  10-40 mL Intracatheter PRN Fritzi Mandes, MD      . valproate (DEPACON) 1,250 mg in dextrose 5 % 50 mL IVPB  1,250 mg Intravenous Q12H Leotis Pain, MD   Stopped at 02/23/17 1123    Musculoskeletal: Strength & Muscle Tone: decreased Gait & Station: unable to stand Patient leans: N/A  Psychiatric Specialty Exam: Physical Exam  Nursing note and vitals reviewed. Constitutional: He appears well-developed  and well-nourished.  HENT:  Head: Normocephalic and atraumatic.  Eyes: Pupils are equal, round, and reactive to light. Conjunctivae are normal.  Neck: Normal range of motion.  Cardiovascular: Normal heart sounds.   Respiratory: Effort normal.  GI: Soft.  Genitourinary:     Musculoskeletal: Normal range of motion.  Neurological: He is alert. He displays tremor. He exhibits abnormal muscle tone.  Skin: Skin is warm and dry.  Psychiatric: His affect is blunt. His speech is delayed. He is agitated and slowed. Cognition and memory are impaired. He is noncommunicative.    Review of Systems  Unable to perform ROS: Medical condition    Blood pressure (!) 155/85, pulse (!) 114, temperature 98.6 F (37 C), temperature source Oral, resp. rate (!) 22, height 5' 10"  (1.778 m), weight 80.7 kg (178 lb), SpO2 100 %.Body mass index is 25.54 kg/m.  General Appearance: Disheveled  Eye Contact:  Minimal  Speech:  Garbled  Volume:  Decreased  Mood:  Unable to assess  Affect:  Flat; Sedated  Thought Process:  NA  Orientation:  Negative  Thought Content:  Negative  Suicidal Thoughts:  No  Homicidal Thoughts:  No  Memory:  Negative  Judgement:  Negative  Insight:  Negative  Psychomotor Activity:  Negative  Concentration:  Concentration: Negative  Recall:  Negative  Fund of Knowledge:  Negative  Language:  Negative  Akathisia:  Negative  Handed:  Right  AIMS (if indicated):     Assets:  Housing  ADL's:  Impaired  Cognition:  Impaired,  Severe  Sleep:        Treatment Plan Summary:   Delirium, multifactorial in origin  R/O Depakote toxicity  Daily contact with patient to assess and evaluate symptoms and progress in treatment, Medication management and Plan 57 year old man with a history of chronic mental illness. Currently unresponsive. Multiple probable etiologies including infection possible neurologic condition. Can't rule out catatonia although I suspect it would be one factor  among many. Patient is not receiving his oral antipsychotics because he is unable to take oral medicine which is maintaining on the one hand that it is not possible that these are causing oversedation but on the other hand it is possible that he could be suffering from a lack of his antipsychotic. At this point I will probably error on the side of not restarting any acute antipsychotic while he is still so medically sick but will sign this out to the doctor on call and continue to follow-up regularly.   Error noted: For several days, notes were indicating that the patient was not receiving antipsychotic but according to the [facility] he was receiving Seroquel. Seroquel and Prolixin discontinued on this 02/22/17 in an attempt to see if the patient was more arousable.. He has only received when necessary Ativan since yesterday and no when necessary Haldol. For now, will continue when necessary Ativan  and Haldol and restart Seroquel at 100 mg by mouth nightly for mood stabilization. Valproic acid level was 110 this morning and will check FREE valproic acid level. Must rule out Depakote toxicity as a contributing factor for sedation. Sitter remains at bedside and the patient continues to be delirious. Per the group home manager who is at the bedside, no mental status changes at the group home prior to admission. He is far from his baseline.  Disposition: Patient does not meet criteria for psychiatric inpatient admission. Supportive therapy provided about ongoing stressors.  Jay Schlichter, MD 02/23/2017 2:21 PM

## 2017-02-23 NOTE — Progress Notes (Signed)
[facility], [place] 02/23/17  Subjective:  Patient remains lethargic but is arousable. Not following commands. Agitated  At times per nursing.   Objective:  Vital signs in last 24 hours:  Temp:  [98.6 F (37 C)] 98.6 F (37 C) (07/22 1446) Pulse Rate:  [108-114] 108 (07/22 1446) Resp:  [16-22] 16 (07/22 1446) BP: (154-163)/(85-97) 163/89 (07/22 1446) SpO2:  [99 %-100 %] 99 % (07/22 1446)  Weight change:  Filed Weights   02/18/17 0558 02/20/17 0549 02/20/17 1909  Weight: 84.1 kg (185 lb 6.5 oz) 81.1 kg (178 lb 12.8 oz) 80.7 kg (178 lb)    Intake/Output:    Intake/Output Summary (Last 24 hours) at 02/23/17 1452 Last data filed at 02/23/17 1449  Gross per 24 hour  Intake             1600 ml  Output             3250 ml  Net            -1650 ml     Physical Exam: General: No acute distress   HEENT Normocephalic/atraumatic, old mucosal moist   Neck supple  Pulm/lungs Scattered rhonchi, normal respiratory effort   CVS/Heart Regular; no rub  Abdomen:  Soft, distended, Bowel sounds present   Extremities: No edema  Neurologic: Lethargic, arousable, not following commands  Skin: No acute rashes   Foley in place with yellow urine       Basic Metabolic Panel:   Recent Labs Lab 02/17/17 0500 02/19/17 0857 02/20/17 1001 02/21/17 1518 02/23/17 1013  NA 138 144 150* 146* 152*  K 3.5 3.2* 3.1* 2.7* 3.5  CL 97* 103 109 109 115*  CO2 27 24 24 28 26   GLUCOSE 115* 71 85 109* 116*  BUN 71* 59* 65* 52* 66*  CREATININE 8.39* 7.27* 8.08* 5.85* 7.46*  CALCIUM 8.3* 10.2 10.8* 9.8 11.5*  MG 1.9  --   --   --   --   PHOS 6.8*  --   --  4.1  --      CBC:  Recent Labs Lab 02/16/17 1615 02/17/17 0500 02/20/17 1001  WBC 17.6* 12.2* 20.3*  NEUTROABS 16.7*  --   --   HGB 10.0* 8.0* 8.8*  HCT 29.6* 23.2* 26.5*  MCV 88.8 85.9 87.2  PLT 398 304 306      Lab Results  Component Value Date   HEPBSAG Negative 02/16/2017   HEPBSAB Non  Reactive 02/16/2017      Microbiology:  Recent Results (from the past 240 hour(s))  Urine Culture     Status: Abnormal   Collection Time: 02/16/17  4:15 PM  Result Value Ref Range Status   Specimen Description URINE, RANDOM  Final   Special Requests NONE  Final   Culture (A)  Final    >=100,000 COLONIES/mL CITROBACTER KOSERI 50,000 COLONIES/mL PSEUDOMONAS AERUGINOSA    Report Status 02/19/2017 FINAL  Final   Organism ID, Bacteria CITROBACTER KOSERI (A)  Final   Organism ID, Bacteria PSEUDOMONAS AERUGINOSA (A)  Final      Susceptibility   Citrobacter koseri - MIC*    CEFAZOLIN <=4 SENSITIVE Sensitive     CEFTRIAXONE <=1 SENSITIVE Sensitive     CIPROFLOXACIN <=0.25 SENSITIVE Sensitive     GENTAMICIN <=1 SENSITIVE Sensitive     IMIPENEM <=0.25 SENSITIVE Sensitive     NITROFURANTOIN 32 SENSITIVE Sensitive     TRIMETH/SULFA <=20 SENSITIVE Sensitive     PIP/TAZO <=4 SENSITIVE Sensitive     * >=  100,000 COLONIES/mL CITROBACTER KOSERI   Pseudomonas aeruginosa - MIC*    CEFTAZIDIME 4 SENSITIVE Sensitive     CIPROFLOXACIN <=0.25 SENSITIVE Sensitive     GENTAMICIN <=1 SENSITIVE Sensitive     IMIPENEM 1 SENSITIVE Sensitive     PIP/TAZO 8 SENSITIVE Sensitive     CEFEPIME 2 SENSITIVE Sensitive     * 50,000 COLONIES/mL PSEUDOMONAS AERUGINOSA  Culture, blood (Routine X 2) w Reflex to ID Panel     Status: None   Collection Time: 02/16/17  8:42 PM  Result Value Ref Range Status   Specimen Description BLOOD LEFT ARM  Final   Special Requests   Final    BOTTLES DRAWN AEROBIC AND ANAEROBIC Blood Culture adequate volume   Culture NO GROWTH 5 DAYS  Final   Report Status 02/21/2017 FINAL  Final  Culture, blood (Routine X 2) w Reflex to ID Panel     Status: None   Collection Time: 02/16/17  8:52 PM  Result Value Ref Range Status   Specimen Description BLOOD LEFT HAND  Final   Special Requests   Final    BOTTLES DRAWN AEROBIC AND ANAEROBIC Blood Culture adequate volume   Culture NO GROWTH 5  DAYS  Final   Report Status 02/21/2017 FINAL  Final  MRSA PCR Screening     Status: None   Collection Time: 02/16/17  9:00 PM  Result Value Ref Range Status   MRSA by PCR NEGATIVE NEGATIVE Final    Comment:        The GeneXpert MRSA Assay (FDA approved for NASAL specimens only), is one component of a comprehensive MRSA colonization surveillance program. It is not intended to diagnose MRSA infection nor to guide or monitor treatment for MRSA infections.     Coagulation Studies: No results for input(s): LABPROT, INR in the last 72 hours.  Urinalysis: No results for input(s): COLORURINE, LABSPEC, PHURINE, GLUCOSEU, HGBUR, BILIRUBINUR, KETONESUR, PROTEINUR, UROBILINOGEN, NITRITE, LEUKOCYTESUR in the last 72 hours.  Invalid input(s): APPERANCEUR    Imaging: No results found.   Medications:   . dextrose 5 % with KCl 20 mEq / L 20 mEq (02/23/17 1022)  . valproate sodium Stopped (02/23/17 1123)   . chlorhexidine gluconate (MEDLINE KIT)  15 mL Mouth Rinse BID  . diltiazem  30 mg Oral Q6H  . feeding supplement (NEPRO CARB STEADY)  237 mL Oral TID BM  . heparin  5,000 Units Subcutaneous Q8H  . mouth rinse  15 mL Mouth Rinse QID  . metoprolol tartrate  25 mg Oral BID  . multivitamin  1 tablet Oral QHS  . pantoprazole  40 mg Oral Daily  . QUEtiapine  100 mg Oral QHS     Assessment/ Plan:  56 y.o. AA male with COPD, HTN, Multinodular goiter, Schizoaffective disorder, CKD st 4, Primary hyperparathyroidism with hypercalcemia admitted for ARF  1. ARF on CKD st 4  Baseline Cr 3.3/GFR 23 from 01/27/2017 Admit Cr 15 ; likely secondary to underlying volume depletion, sepsis and severe ATN 2.  Hyperkalemia, improved 3. Acidosis, improved 4. UTI with sepsis 5. Acute resp failure - extubated 02/18/17 6. Recent Urinary retention 7. Anemia of CKD.  8. Hypernatremia.  Plan: Patient's BUN and creatinine have risen. Creatinine currently up to 7.4. However patient does have good urine  output at 2.7 L over the Preceding 24 hours. No urgent indication For dialysis at the moment. However we will continue to monitor for this possibility. Patient currently unable to sit up for outpatient dialysis.  Serum sodium up to 152. Patient has been started back on D5W. Continue to monitor serum electrolytes daily. Overall prognosis guarded.     LOS: 7 Manju Kulkarni 7/22/20182:52 PM  [facility], [place]

## 2017-02-23 NOTE — Progress Notes (Signed)
Pt more alert this evening, When I asked patient about changing positions he mumbled "uh uh" shaking head no and then when I asked about placing a pillow behind his head he said "no".

## 2017-02-24 ENCOUNTER — Ambulatory Visit: Payer: Self-pay | Admitting: Urology

## 2017-02-24 LAB — CBC
HEMATOCRIT: 25.7 % — AB (ref 40.0–52.0)
Hemoglobin: 8.5 g/dL — ABNORMAL LOW (ref 13.0–18.0)
MCH: 30.5 pg (ref 26.0–34.0)
MCHC: 33.1 g/dL (ref 32.0–36.0)
MCV: 92.3 fL (ref 80.0–100.0)
PLATELETS: 221 10*3/uL (ref 150–440)
RBC: 2.79 MIL/uL — ABNORMAL LOW (ref 4.40–5.90)
RDW: 15.3 % — AB (ref 11.5–14.5)
WBC: 10.9 10*3/uL — ABNORMAL HIGH (ref 3.8–10.6)

## 2017-02-24 LAB — COMPREHENSIVE METABOLIC PANEL
ALBUMIN: 2.5 g/dL — AB (ref 3.5–5.0)
ALK PHOS: 76 U/L (ref 38–126)
ALT: 20 U/L (ref 17–63)
ANION GAP: 13 (ref 5–15)
AST: 19 U/L (ref 15–41)
BUN: 69 mg/dL — ABNORMAL HIGH (ref 6–20)
CHLORIDE: 118 mmol/L — AB (ref 101–111)
CO2: 24 mmol/L (ref 22–32)
Calcium: 11.8 mg/dL — ABNORMAL HIGH (ref 8.9–10.3)
Creatinine, Ser: 7.22 mg/dL — ABNORMAL HIGH (ref 0.61–1.24)
GFR calc Af Amer: 9 mL/min — ABNORMAL LOW (ref >60)
GFR calc non Af Amer: 8 mL/min — ABNORMAL LOW (ref >60)
Glucose, Bld: 125 mg/dL — ABNORMAL HIGH (ref 65–99)
POTASSIUM: 4.3 mmol/L (ref 3.5–5.1)
SODIUM: 155 mmol/L — AB (ref 135–145)
Total Bilirubin: 0.5 mg/dL (ref 0.3–1.2)
Total Protein: 7.4 g/dL (ref 6.5–8.1)

## 2017-02-24 LAB — BLOOD GAS, VENOUS
ACID-BASE DEFICIT: 19.2 mmol/L — AB (ref 0.0–2.0)
Bicarbonate: 8.9 mmol/L — ABNORMAL LOW (ref 20.0–28.0)
O2 SAT: 67.7 %
PCO2 VEN: 28 mmHg — AB (ref 44.0–60.0)
PH VEN: 7.11 — AB (ref 7.250–7.430)
PO2 VEN: 49 mmHg — AB (ref 32.0–45.0)
Patient temperature: 37

## 2017-02-24 MED ORDER — EPOETIN ALFA 10000 UNIT/ML IJ SOLN
10000.0000 [IU] | INTRAMUSCULAR | Status: DC
  Administered 2017-02-24: 10000 [IU] via INTRAVENOUS

## 2017-02-24 MED ORDER — DEXTROSE-NACL 5-0.45 % IV SOLN
INTRAVENOUS | Status: AC
  Administered 2017-02-24: 14:00:00 via INTRAVENOUS

## 2017-02-24 NOTE — Progress Notes (Addendum)
Per Dialysis nurse, the moment she stopped  dialysis process, pt. calm down and went to sleep.

## 2017-02-24 NOTE — Progress Notes (Signed)
[facility] [place] [facility]   PATIENT NAME: Jason RockersMichael Diaz    MR#:  [id number]  DATE OF BIRTH:  10-22-1960  SUBJECTIVE:  \ noncommunicative,Resting quietly. Sitter in the room. Has not eaten much. He is drinking some thickened liquid  REVIEW OF SYSTEMS:   Review of Systems  Constitutional: Negative for chills, fever and weight loss.  HENT: Negative for ear discharge, ear pain and nosebleeds.   Eyes: Negative for blurred vision, pain and discharge.  Respiratory: Negative for sputum production, shortness of breath, wheezing and stridor.   Cardiovascular: Negative for chest pain, palpitations, orthopnea and PND.  Gastrointestinal: Negative for abdominal pain, diarrhea, nausea and vomiting.  Genitourinary: Negative for frequency and urgency.  Musculoskeletal: Negative for back pain and joint pain.  Neurological: Positive for weakness. Negative for sensory change, speech change and focal weakness.  Psychiatric/Behavioral: Negative for depression and hallucinations. The patient is not nervous/anxious.    Tolerating Diet: not much Tolerating PT: pending  DRUG ALLERGIES:  No Known Allergies  VITALS:  Blood pressure (!) 130/92, pulse 93, temperature 99.5 F (37.5 C), temperature source Oral, resp. rate 18, height 5\' 10"  (1.778 m), weight 90 kg (198 lb 6.6 oz), SpO2 100 %.  PHYSICAL EXAMINATION:   Physical Exam Limited exam due to pt particpation and mental status GENERAL:  56 y.o.-year-old patient lying in the bed with no acute distress. Chronically ill EYES: Pupils equal, round, reactive to light and accommodation. No scleral icterus. Extraocular muscles intact.  HEENT: Head atraumatic, normocephalic. Oropharynx and nasopharynx clear. NECK:  Supple, no jugular venous distention. No thyroid enlargement, no tenderness.  LUNGS: Normal breath sounds bilaterally, no wheezing, rales, rhonchi. No use of accessory muscles of respiration.  CARDIOVASCULAR: S1,  S2 normal. No murmurs, rubs, or gallops.  ABDOMEN: Soft, nontender, nondistended. Bowel sounds present. No organomegaly or mass.  EXTREMITIES: No cyanosis, clubbing or edema b/l.    NEUROLOGIC:moves extremities well spontaneously PSYCHIATRIC: Resting SKIN: No obvious rash, lesion, or ulcer.   LABORATORY PANEL:  CBC  Recent Labs Lab 02/24/17 0443  WBC 10.9*  HGB 8.5*  HCT 25.7*  PLT 221    Chemistries   Recent Labs Lab 02/24/17 0938  NA 155*  K 4.3  CL 118*  CO2 24  GLUCOSE 125*  BUN 69*  CREATININE 7.22*  CALCIUM 11.8*  AST 19  ALT 20  ALKPHOS 76  BILITOT 0.5   Cardiac Enzymes No results for input(s): TROPONINI in the last 168 hours. RADIOLOGY:  No results found. ASSESSMENT AND PLAN:  Jason RockersMichael Mis  is a 56 y.o. male with a known history of Chronic kidney disease stage IV baseline creatinine around 3.4, GERD, schizoaffective disorder., Hypertension [facility] after patient slipping in his urine causing him to fall and hitting his head on the nightstand. Staff noted shaking questionable seizure received 4 mg of Versed. Patient was quite confused came to the emergency room and was found to be in septic shock. His white count was elevated to 17,000 was tachycardic and creatinine was 15.4 with potassium of 7.5.  1. Acute metabolic encephalopathy multifactorial secondary to septic shock, acute on chronic severe renal failure with dehydration -Patient was intubated placed on the ventilator---extubated 02/18/17 -Continue to monitor for seizure activity. -Appreciate neurology consultation. Patient on IV Depakote---can we change to oral depakote  2. Septic shock source appears urine -Patient was discharged 01/28/2017 with an indwelling Foley catheter given significant hematuria and difficulty urination. He was  supposed to follow with urology not sure if he made to the appointment -IV cefepime.(completed treatment) - urine culture  growing more than 100,000 citrobacter and pseudomonas -blood culture--->  negative.  3. Acute on chronic kidney failure stage IV/5 -Baseline creatinine 3.35------ came in with creatinine of 15.49--- continue hemodialysis---7.27-- 8.08 -Nephrology consultation appreciated. D/w dr Minda Ditto to see if we can dialyze a few days to see if mentation improves -patient has challenges to sit still for 3 hours.  4. Acute severe hyperkalemia--resolved  5. Leukocytosis due to #2  6. DVT prophylaxis subcutaneous heparin  7. Possible seizures versus jerky/tremor movements and to uremic encephalopathy -Empirically on IV depakote -Appreciate neurology input. -EEG done and showing triphasic activity waveforms---cont anti seizure meds  8. Acute delirium/agitation with history of schizoaffective disorder -psychiatry consultation by Dr Toni Amend -Spoke with Dr. Maryruth Bun who recommends to hold off Prolixin and decreased Seroquel  To 100 mg daily  Patient has been having periods of agitation and restlessness  And being noncompliant with his meds. Palliative consultation appreciated. They spoke with patient's mother Karena Addison. Patient is a full code.  Social worker for discharge planning.  Case discussed with Care Management/Social Worker. Management plans discussed with the patient, family and they are in agreement.  CODE STATUS: full  DVT Prophylaxis: heparin  TOTAL TIME TAKING CARE OF THIS PATIENT: *25* minutes.  >50% time spent on counselling and coordination of care    Note: This dictation was prepared with Dragon dictation along with smaller phrase technology. Any transcriptional errors that result from this process are unintentional.  Anaisa Radi M.D on 02/24/2017 at 5:23 PM  Between 7am to 6pm - Pager - [phone]  After 6pm go to [email or web address] - Social research officer, government  [facility]  Office  [phone]  CC: Primary care physician; Armando Gang, FNP

## 2017-02-24 NOTE — Progress Notes (Signed)
Subjective: Patient slightly more arousable that on Friday.  Has said a few words to staff.    Objective: Current vital signs: BP 133/83 (BP Location: Right Arm)   Pulse 68   Temp 98.2 F (36.8 C) (Axillary)   Resp 18   Ht _0  (1.778 m)   Wt 80.7 kg (178 lb)   SpO2 100%   BMI 25.54 kg/m  Vital signs in last 24 hours: Temp:  [98.2 F (36.8 C)-98.6 F (37 C)] 98.2 F (36.8 C) (07/23 0507) Pulse Rate:  [68-108] 68 (07/23 0507) Resp:  [16-20] 18 (07/23 0507) BP: (133-163)/(83-89) 133/83 (07/23 0507) SpO2:  [98 %-100 %] 100 % (07/23 0507)  Intake/Output from previous day: 07/22 0701 - 07/23 0700 In: 2175 [P.O.:240; I.V.:1722.5; NG/GT:150; IV Piggyback:62.5] Out: 2950 [Urine:2950] Intake/Output this shift: Total I/O In: -  Out: 300 [Urine:300] Nutritional status: Diet NPO time specified Except for: Sips with Meds, Other (See Comments)  Neurologic Exam: Mental Status: Lethargic but arousable to light sternal rub.  Says "ouch".  Does not follow commands.  Cranial Nerves: II: Pupils equal, round, reactive to light and accommodation III,IV, VI: Intact oculocephalic maneuvers V,VII: corneals intactbilaterally VIII: unable to be tested IX,X: gag reflex present XI: unable to be tested XII: unable to be tested Motor: Moves all extremities against gravity.   Sensory: Responds to light stimuli throughout  Lab Results: Basic Metabolic Panel:  Recent Labs Lab 02/19/17 0857 02/20/17 1001 02/21/17 1518 02/23/17 1013 02/24/17 0938  NA 144 150* 146* 152* 155*  K 3.2* 3.1* 2.7* 3.5 4.3  CL 103 109 109 115* 118*  CO2 _1 GLUCOSE 71 85 109* 116* 125*  BUN 59* 65* 52* 66* 69*  CREATININE 7.27* 8.08* 5.85* 7.46* 7.22*  CALCIUM 10.2 10.8* 9.8 11.5* 11.8*  PHOS  --   --  4.1  --   --     Liver Function Tests:  Recent Labs Lab 02/24/17 0938  AST 19  ALT 20  ALKPHOS 76  BILITOT 0.5  PROT 7.4  ALBUMIN 2.5*   No results for input(s): LIPASE, AMYLASE  in the last 168 hours. No results for input(s): AMMONIA in the last 168 hours.  CBC:  Recent Labs Lab 02/20/17 1001 02/24/17 0443  WBC 20.3* 10.9*  HGB 8.8* 8.5*  HCT 26.5* 25.7*  MCV 87.2 92.3  PLT 306 221    Cardiac Enzymes: No results for input(s): CKTOTAL, CKMB, CKMBINDEX, TROPONINI in the last 168 hours.  Lipid Panel: No results for input(s): CHOL, TRIG, HDL, CHOLHDL, VLDL, LDLCALC in the last 168 hours.  CBG:  Recent Labs Lab 02/17/17 2044 02/18/17 0006 02/18/17 0408 02/18/17 0728 02/18/17 1141  GLUCAP 77 102* 88 95 76    Microbiology: Results for orders placed or performed during the hospital encounter of 02/16/17  Urine Culture     Status: Abnormal   Collection Time: 02/16/17  4:15 PM  Result Value Ref Range Status   Specimen Description URINE, RANDOM  Final   Special Requests NONE  Final   Culture (A)  Final    >=100,000 COLONIES/mL CITROBACTER KOSERI 50,000 COLONIES/mL PSEUDOMONAS AERUGINOSA    Report Status 02/19/2017 FINAL  Final   Organism ID, Bacteria CITROBACTER KOSERI (A)  Final   Organism ID, Bacteria PSEUDOMONAS AERUGINOSA (A)  Final      Susceptibility   Citrobacter koseri - MIC*    CEFAZOLIN <=4 SENSITIVE Sensitive     CEFTRIAXONE <=1 SENSITIVE Sensitive  CIPROFLOXACIN <=0.25 SENSITIVE Sensitive     GENTAMICIN <=1 SENSITIVE Sensitive     IMIPENEM <=0.25 SENSITIVE Sensitive     NITROFURANTOIN 32 SENSITIVE Sensitive     TRIMETH/SULFA <=20 SENSITIVE Sensitive     PIP/TAZO <=4 SENSITIVE Sensitive     * >=100,000 COLONIES/mL CITROBACTER KOSERI   Pseudomonas aeruginosa - MIC*    CEFTAZIDIME 4 SENSITIVE Sensitive     CIPROFLOXACIN <=0.25 SENSITIVE Sensitive     GENTAMICIN <=1 SENSITIVE Sensitive     IMIPENEM 1 SENSITIVE Sensitive     PIP/TAZO 8 SENSITIVE Sensitive     CEFEPIME 2 SENSITIVE Sensitive     * 50,000 COLONIES/mL PSEUDOMONAS AERUGINOSA  Culture, blood (Routine X 2) w Reflex to ID Panel     Status: None   Collection Time:  02/16/17  8:42 PM  Result Value Ref Range Status   Specimen Description BLOOD LEFT ARM  Final   Special Requests   Final    BOTTLES DRAWN AEROBIC AND ANAEROBIC Blood Culture adequate volume   Culture NO GROWTH 5 DAYS  Final   Report Status 02/21/2017 FINAL  Final  Culture, blood (Routine X 2) w Reflex to ID Panel     Status: None   Collection Time: 02/16/17  8:52 PM  Result Value Ref Range Status   Specimen Description BLOOD LEFT HAND  Final   Special Requests   Final    BOTTLES DRAWN AEROBIC AND ANAEROBIC Blood Culture adequate volume   Culture NO GROWTH 5 DAYS  Final   Report Status 02/21/2017 FINAL  Final  MRSA PCR Screening     Status: None   Collection Time: 02/16/17  9:00 PM  Result Value Ref Range Status   MRSA by PCR NEGATIVE NEGATIVE Final    Comment:        The GeneXpert MRSA Assay (FDA approved for NASAL specimens only), is one component of a comprehensive MRSA colonization surveillance program. It is not intended to diagnose MRSA infection nor to guide or monitor treatment for MRSA infections.     Coagulation Studies: No results for input(s): LABPROT, INR in the last 72 hours.  Imaging: No results found.  Medications:  I have reviewed the patient's current medications. Scheduled: . chlorhexidine gluconate (MEDLINE KIT)  15 mL Mouth Rinse BID  . diltiazem  30 mg Oral Q6H  . feeding supplement (NEPRO CARB STEADY)  237 mL Oral TID BM  . heparin  5,000 Units Subcutaneous Q8H  . mouth rinse  15 mL Mouth Rinse QID  . metoprolol tartrate  25 mg Oral BID  . multivitamin  1 tablet Oral QHS  . pantoprazole  40 mg Oral Daily  . QUEtiapine  100 mg Oral QHS    Assessment/Plan: Patient with some minor improvement noted.  Continues to have multiple metabolic issues and his psych meds have been discontinued.  Suspect current mental status is multifactorial.  Head CT repeated on Friday shows no acute changes.  Would continue Depakote.  Level has been supratherapeutic.   Dosage adjusted.  Recommendations: 1.  Repeat Depakote level in AM.     LOS: 8 days   Alexis Goodell, MD Neurology [phone] 02/24/2017  11:32 AM

## 2017-02-24 NOTE — Progress Notes (Signed)
Post hd assessment 

## 2017-02-24 NOTE — Progress Notes (Signed)
Pre hd info 

## 2017-02-24 NOTE — Progress Notes (Signed)
Nutrition Follow-up  DOCUMENTATION CODES:   Not applicable  INTERVENTION:  1. If unable to advance diet within 2 days, recommend place feeding tube and begin enteral feeds with Nepro at 2820mL/hr increase by 10 every 8 hours to goal rate of 1550mL/hr, Prostat 30mL daily Regimen provides 2260 calories, 112 grams protein, 872mL free water 2. Continue Rena-Vit 3. Continue Nepro Shake po TID, each supplement provides 425 kcal and 19 grams protein  NUTRITION DIAGNOSIS:   Inadequate oral intake related to  (altered mental status) as evidenced by meal completion < 50%.  GOAL:   Patient will meet greater than or equal to 90% of their needs -ongoing  MONITOR:   PO intake, Supplement acceptance, Labs, Weight trends, I & O's  REASON FOR ASSESSMENT:   Ventilator    ASSESSMENT:   Patient with PMH of CKD stg IV, baseline creatinine 3.4, GERD, Hypertension, slipped and fell on his urine, possible seizure, presented with acute metabolic encephelopathy secondary to UTI and septic shock, acute on chronic renal failure, metabolic acidosis requiring emergent HD  Patient continues to be unresponsive to verbal stimuli. Underwent SLP eval - now NPO due to packing foods, not swallowing or accepting spoons/straws. No edema upon examination. Foley cath. Examined by Psych, not a candidate for inpatient admission at this point in time.  Intake/Output Summary (Last 24 hours) at 02/24/17 1539 Last data filed at 02/24/17 1009  Gross per 24 hour  Intake             1650 ml  Output             1550 ml  Net              100 ml   Labs reviewed: Na 155, BUN/Creatinine 69/7.22, HGB/HCT 8.5/25.7 - receiving EPO with HD Medications reviewed.   Diet Order:  Diet NPO time specified Except for: Sips with Meds, Other (See Comments)  Skin:  Reviewed, no issues  Last BM:  7/18  Height:   Ht Readings from Last 1 Encounters:  02/16/17 5\' 10"  (1.778 m)    Weight:   Wt Readings from Last 1 Encounters:   02/20/17 178 lb (80.7 kg)    Ideal Body Weight:  75.45 kg  BMI:  Body mass index is 25.54 kg/m.  Estimated Nutritional Needs:   Kcal:  2200-2500kcak/day   Protein:  105-121g/day   Fluid:  per MD  EDUCATION NEEDS:   Education needs no appropriate at this time  Dionne AnoWilliam M. Benicia Bergevin, MS, RD LDN Inpatient Clinical Dietitian Pager [phone]

## 2017-02-24 NOTE — Consult Note (Signed)
[facility] Face-to-Face Psychiatry Consult   Reason for Consult:  Consult for 56 year old man with a history of schizophrenia who was brought into the hospital after a fall and injury and then required intubation. Referring Physician:  Posey Pronto Patient Identification: Jason Diaz MRN:  [id number] Principal Diagnosis: Acute delirium Diagnosis:   Patient Active Problem List   Diagnosis Date Noted  . Acute renal failure superimposed on chronic kidney disease ([place]) [N17.9, N18.9]   . Palliative care by specialist [Z51.5]   . Goals of care, counseling/discussion [Z71.89]   . Acute delirium [R41.0] 02/19/2017  . Acute respiratory failure ([place]) [J96.00]   . Seizure-like activity ([place]) [R56.9]   . Sepsis ([place]) [A41.9]   . Septic shock ([place]) [A41.9, R65.21] 02/16/2017  . Schizoaffective disorder ([place]) [F25.9] 02/16/2017  . Hypercalcemia [E83.52] 01/27/2017  . Hematuria [R31.9] 01/27/2017  . Acute urinary retention [R33.8] 01/27/2017  . Hyponatremia [E87.1] 01/27/2017  . Hypokalemia [E87.6] 01/27/2017  . ARF (acute renal failure) ([place]) [N17.9] 01/23/2017    Total Time spent with patient: 20 minutes  Subjective:   Jason Diaz is a 56 y.o. male patient admitted with patient is not able to communicate.   02/22/17:The patient remained sedated and is nonresponsive to this Probation officer. Per the sitter at the bedside, he has been mumbling at times. No seizure activity noted. He has been taking some medications. Although prior notes indicate that he is not on any psychotropic medications, according to the [facility] the patient was getting Seroquel 200 mg by mouth twice a day and Prolixin 5 mg by mouth nightly.  02/23/17: The patient has been more alert today but is not interacting with this Probation officer. The sitter has been at the bedside and says that he has fairly calm. He has only received 1 when necessary Ativan today and no prn Haldol. VPA level was toxic at 110. The group home supervisor was at the bedside and was  in the process of faxing the [facility] to the hospital.   02/24/2017. Jason Diaz remains nonverbal but according to his nurse he is somewhat more awake and agitated. Neurology noticed slight improvement as well. We will not offer any medication changes today.   HPI:  Attempted to interview patient. Reviewed old chart. This is a 56 year old man who carries a diagnosis of schizophrenia or schizoaffective disorder and who lives in a group home. He was brought to the hospital after reportedly having a fall and hitting his head. He was thought to have some seizure-like activity but was also found to be an acute worsening renal failure. Patient was intubated and was in the ICU briefly and has now been extubated and moved to the floor. Patient on evaluation had his eyes open and appeared to be awake but was not able to verbally interact. He was able to answer yes to a couple of questions but was not able to say anything else and was not able to follow direct commands or say his name. Affect flat and somewhat confused.  Social history: According to the chart he lives in a group home or assisted living type setting. He reports that they're his family at times that might be involved but the closest communication seems to be with the manager of the facility.  Medical history: Chronic renal failure which has worsened in the hospital. Possible new seizure activity.  Substance abuse history: No information  Past Psychiatric History: We don't have any previous psychiatric information other than the list of medicines that he was on. It  looks like he was on quite a bit of psychiatric medicine including 3 antipsychotics between the Seroquel risperdal and Prolixin. We don't have any record of prior hospitalizations or symptoms at baseline.  Risk to Self: Is patient at risk for suicide?: No Risk to Others:   Prior Inpatient Therapy:   Prior Outpatient Therapy:    Past Medical History:  Past Medical History:  Diagnosis  Date  . CKD (chronic kidney disease), stage IV ([place])   . COPD (chronic obstructive pulmonary disease) ([place])   . GERD (gastroesophageal reflux disease)   . Hypertension   . Multinodular goiter   . Schizoaffective disorder [facility])     Past Surgical History:  Procedure Laterality Date  . TONSILLECTOMY    . TOOTH EXTRACTION     Family History:  Family History  Problem Relation Age of Onset  . Kidney disease Father   . Diabetes Father    Family Psychiatric  History: Unknown Social History:  History  Alcohol Use No     History  Drug Use No    Social History   Social History  . Marital status: Single    Spouse name: N/A  . Number of children: N/A  . Years of education: N/A   Social History Main Topics  . Smoking status: Current Every Day Smoker    Packs/day: 0.25    Types: Cigarettes  . Smokeless tobacco: Never Used  . Alcohol use No  . Drug use: No  . Sexual activity: Not Asked   Other Topics Concern  . None   Social History Narrative   From [facility] group home       Allergies:  No Known Allergies  Labs:  Results for orders placed or performed during the hospital encounter of 02/16/17 (from the past 48 hour(s))  Valproic acid level     Status: Abnormal   Collection Time: 02/23/17  4:17 AM  Result Value Ref Range   Valproic Acid Lvl 110 (H) 50.0 - 100.0 ug/mL  Basic metabolic panel     Status: Abnormal   Collection Time: 02/23/17 10:13 AM  Result Value Ref Range   Sodium 152 (H) 135 - 145 mmol/L   Potassium 3.5 3.5 - 5.1 mmol/L   Chloride 115 (H) 101 - 111 mmol/L   CO2 26 22 - 32 mmol/L   Glucose, Bld 116 (H) 65 - 99 mg/dL   BUN 66 (H) 6 - 20 mg/dL   Creatinine, Ser 7.46 (H) 0.61 - 1.24 mg/dL   Calcium 11.5 (H) 8.9 - 10.3 mg/dL   GFR calc non Af Amer 7 (L) >60 mL/min   GFR calc Af Amer 9 (L) >60 mL/min    Comment: (NOTE) The eGFR has been calculated using the CKD EPI equation. This calculation has not been validated in all clinical  situations. eGFR's persistently <60 mL/min signify possible Chronic Kidney Disease.    Anion gap 11 5 - 15  CBC     Status: Abnormal   Collection Time: 02/24/17  4:43 AM  Result Value Ref Range   WBC 10.9 (H) 3.8 - 10.6 K/uL   RBC 2.79 (L) 4.40 - 5.90 MIL/uL   Hemoglobin 8.5 (L) 13.0 - 18.0 g/dL   HCT 25.7 (L) 40.0 - 52.0 %   MCV 92.3 80.0 - 100.0 fL   MCH 30.5 26.0 - 34.0 pg   MCHC 33.1 32.0 - 36.0 g/dL   RDW 15.3 (H) 11.5 - 14.5 %   Platelets 221 150 - 440 K/uL  Comprehensive metabolic panel     Status: Abnormal   Collection Time: 02/24/17  9:38 AM  Result Value Ref Range   Sodium 155 (H) 135 - 145 mmol/L   Potassium 4.3 3.5 - 5.1 mmol/L   Chloride 118 (H) 101 - 111 mmol/L   CO2 24 22 - 32 mmol/L   Glucose, Bld 125 (H) 65 - 99 mg/dL   BUN 69 (H) 6 - 20 mg/dL   Creatinine, Ser 7.22 (H) 0.61 - 1.24 mg/dL   Calcium 11.8 (H) 8.9 - 10.3 mg/dL   Total Protein 7.4 6.5 - 8.1 g/dL   Albumin 2.5 (L) 3.5 - 5.0 g/dL   AST 19 15 - 41 U/L   ALT 20 17 - 63 U/L   Alkaline Phosphatase 76 38 - 126 U/L   Total Bilirubin 0.5 0.3 - 1.2 mg/dL   GFR calc non Af Amer 8 (L) >60 mL/min   GFR calc Af Amer 9 (L) >60 mL/min    Comment: (NOTE) The eGFR has been calculated using the CKD EPI equation. This calculation has not been validated in all clinical situations. eGFR's persistently <60 mL/min signify possible Chronic Kidney Disease.    Anion gap 13 5 - 15    Current Facility-Administered Medications  Medication Dose Route Frequency Provider Last Rate Last Dose  . acetaminophen (TYLENOL) tablet 650 mg  650 mg Oral Q6H PRN Fritzi Mandes, MD       Or  . acetaminophen (TYLENOL) suppository 650 mg  650 mg Rectal Q6H PRN Fritzi Mandes, MD   650 mg at 02/19/17 2034  . bisacodyl (DULCOLAX) suppository 10 mg  10 mg Rectal Daily PRN Tukov, Magadalene S, NP      . budesonide (PULMICORT) nebulizer solution 0.25 mg  0.25 mg Nebulization BID PRN Awilda Bill, NP      . chlorhexidine gluconate (MEDLINE  KIT) (PERIDEX) 0.12 % solution 15 mL  15 mL Mouth Rinse BID Tukov, Magadalene S, NP   15 mL at 02/24/17 0800  . dextrose 5 %-0.45 % sodium chloride infusion   Intravenous Continuous Fritzi Mandes, MD 75 mL/hr at 02/24/17 1404    . diltiazem (CARDIZEM) tablet 30 mg  30 mg Oral Q6H Fritzi Mandes, MD   30 mg at 02/24/17 0943  . [START ON 02/26/2017] epoetin alfa (EPOGEN,PROCRIT) injection 10,000 Units  10,000 Units Intravenous Q M,W,F-HD Kolluru, Sarath, MD      . feeding supplement (NEPRO CARB STEADY) liquid 237 mL  237 mL Oral TID BM Fritzi Mandes, MD   237 mL at 02/24/17 0943  . haloperidol (HALDOL) 2 MG/ML solution 2 mg  2 mg Oral Q8H PRN Chauncey Mann, MD      . heparin injection 5,000 Units  5,000 Units Subcutaneous Q8H Fritzi Mandes, MD   5,000 Units at 02/24/17 1402  . hydrALAZINE (APRESOLINE) injection 10-20 mg  10-20 mg Intravenous Q4H PRN Wilhelmina Mcardle, MD   10 mg at 02/19/17 1539  . ipratropium-albuterol (DUONEB) 0.5-2.5 (3) MG/3ML nebulizer solution 3 mL  3 mL Nebulization Q4H PRN Awilda Bill, NP      . LORazepam (ATIVAN) injection 2 mg  2 mg Intravenous Q6H PRN Saundra Shelling, MD   2 mg at 02/23/17 1023  . MEDLINE mouth rinse  15 mL Mouth Rinse QID Dorene Sorrow S, NP   15 mL at 02/24/17 0354  . metoprolol tartrate (LOPRESSOR) injection 2.5-5 mg  2.5-5 mg Intravenous Q3H PRN Wilhelmina Mcardle, MD   2.5 mg  at 02/20/17 1116  . metoprolol tartrate (LOPRESSOR) tablet 25 mg  25 mg Oral BID Saundra Shelling, MD   25 mg at 02/24/17 0943  . multivitamin (RENA-VIT) tablet 1 tablet  1 tablet Oral QHS Fritzi Mandes, MD   1 tablet at 02/23/17 2118  . pantoprazole (PROTONIX) EC tablet 40 mg  40 mg Oral Daily Wilhelmina Mcardle, MD   40 mg at 02/24/17 0943  . QUEtiapine (SEROQUEL) tablet 100 mg  100 mg Oral QHS Chauncey Mann, MD   100 mg at 02/23/17 2118  . sennosides (SENOKOT) 8.8 MG/5ML syrup 5 mL  5 mL Per Tube BID PRN Tukov, Magadalene S, NP      . sodium chloride flush (NS) 0.9 % injection 10-40 mL   10-40 mL Intracatheter PRN Fritzi Mandes, MD      . valproate (DEPACON) 1,250 mg in dextrose 5 % 50 mL IVPB  1,250 mg Intravenous Q12H Leotis Pain, MD   Stopped at 02/24/17 1044    Musculoskeletal: Strength & Muscle Tone: decreased Gait & Station: unable to stand Patient leans: N/A  Psychiatric Specialty Exam: Physical Exam  Nursing note and vitals reviewed. Constitutional: He appears well-developed and well-nourished.  HENT:  Head: Normocephalic and atraumatic.  Eyes: Pupils are equal, round, and reactive to light. Conjunctivae are normal.  Neck: Normal range of motion.  Cardiovascular: Normal heart sounds.   Respiratory: Effort normal.  GI: Soft.  Genitourinary:     Musculoskeletal: Normal range of motion.  Neurological: He is alert. He displays tremor. He exhibits abnormal muscle tone.  Skin: Skin is warm and dry.  Psychiatric: His affect is blunt. He is agitated. Cognition and memory are impaired. He is noncommunicative.    Review of Systems  Unable to perform ROS: Medical condition    Blood pressure (!) 165/91, pulse (!) 102, temperature 99.6 F (37.6 C), temperature source Oral, resp. rate (P) 20, height 5' 10"  (1.778 m), weight 80.7 kg (178 lb), SpO2 (P) 100 %.Body mass index is 25.54 kg/m.  General Appearance: Disheveled  Eye Contact:  Minimal  Speech:  Garbled  Volume:  Decreased  Mood:  Unable to assess  Affect:  Flat; Sedated  Thought Process:  NA  Orientation:  Negative  Thought Content:  Negative  Suicidal Thoughts:  No  Homicidal Thoughts:  No  Memory:  Negative  Judgement:  Negative  Insight:  Negative  Psychomotor Activity:  Negative  Concentration:  Concentration: Negative  Recall:  Negative  Fund of Knowledge:  Negative  Language:  Negative  Akathisia:  Negative  Handed:  Right  AIMS (if indicated):     Assets:  Housing  ADL's:  Impaired  Cognition:  Impaired,  Severe  Sleep:        Treatment Plan Summary:   Delirium,  multifactorial in origin  R/O Depakote toxicity  Daily contact with patient to assess and evaluate symptoms and progress in treatment, Medication management and Plan 56 year old man with a history of chronic mental illness. Currently unresponsive. Multiple probable etiologies including infection possible neurologic condition. Can't rule out catatonia although I suspect it would be one factor among many. Patient is not receiving his oral antipsychotics because he is unable to take oral medicine which is maintaining on the one hand that it is not possible that these are causing oversedation but on the other hand it is possible that he could be suffering from a lack of his antipsychotic. At this point I will probably error on the side of  not restarting any acute antipsychotic while he is still so medically sick but will sign this out to the doctor on call and continue to follow-up regularly.   Error noted: For several days, notes were indicating that the patient was not receiving antipsychotic but according to the [facility] he was receiving Seroquel. Seroquel and Prolixin discontinued on this 02/22/17 in an attempt to see if the patient was more arousable.. He has only received when necessary Ativan since yesterday and no when necessary Haldol. For now, will continue when necessary Ativan and Haldol and restart Seroquel at 100 mg by mouth nightly for mood stabilization. Valproic acid level was 110 this morning and will check FREE valproic acid level. Must rule out Depakote toxicity as a contributing factor for sedation. Sitter remains at bedside and the patient continues to be delirious. Per the group home manager who is at the bedside, no mental status changes at the group home prior to admission. He is far from his baseline.  Disposition: Patient does not meet criteria for psychiatric inpatient admission. Supportive therapy provided about ongoing stressors.  Orson Slick, MD 02/24/2017 2:25 PM

## 2017-02-24 NOTE — Progress Notes (Signed)
Administered 2 mg of Ativan IV due to pt. restlessness while in dialysis per Dr. Alice RiegerKolluru's request. Dr. Enedina FinnerSona Patel was made aware via paged.

## 2017-02-24 NOTE — Progress Notes (Signed)
Post hd vitals 

## 2017-02-24 NOTE — Progress Notes (Signed)
[place], [place] 02/24/17  Subjective:   Sitter at bedside.   Not responding with verbal stimuli.   Objective:  Vital signs in last 24 hours:  Temp:  [98.2 F (36.8 C)-99.6 F (37.6 C)] 99.6 F (37.6 C) (07/23 1254) Pulse Rate:  [68-108] 102 (07/23 1254) Resp:  [16-20] (P) 20 (07/23 1254) BP: (133-165)/(83-91) 165/91 (07/23 1254) SpO2:  [98 %-100 %] (P) 100 % (07/23 1254)  Weight change:  Filed Weights   02/18/17 0558 02/20/17 0549 02/20/17 1909  Weight: 84.1 kg (185 lb 6.5 oz) 81.1 kg (178 lb 12.8 oz) 80.7 kg (178 lb)    Intake/Output:    Intake/Output Summary (Last 24 hours) at 02/24/17 1355 Last data filed at 02/24/17 1009  Gross per 24 hour  Intake             1650 ml  Output             3250 ml  Net            -1600 ml     Physical Exam: General: No acute distress   HEENT Normocephalic/atraumatic, old mucosal moist   Neck supple  Pulm/lungs Scattered rhonchi, normal respiratory effort   CVS/Heart Regular; no rub  Abdomen:  Soft, distended, Bowel sounds present   Extremities: No edema  Neurologic: Lethargic, arousable, not following commands  Skin: No acute rashes  GU Foley in place with yellow urine  Access:  Right femoral temp HD catheter    Basic Metabolic Panel:   Recent Labs Lab 02/19/17 0857 02/20/17 1001 02/21/17 1518 02/23/17 1013 02/24/17 0938  NA 144 150* 146* 152* 155*  K 3.2* 3.1* 2.7* 3.5 4.3  CL 103 109 109 115* 118*  CO2 24 24 28 26 24   GLUCOSE 71 85 109* 116* 125*  BUN 59* 65* 52* 66* 69*  CREATININE 7.27* 8.08* 5.85* 7.46* 7.22*  CALCIUM 10.2 10.8* 9.8 11.5* 11.8*  PHOS  --   --  4.1  --   --      CBC:  Recent Labs Lab 02/20/17 1001 02/24/17 0443  WBC 20.3* 10.9*  HGB 8.8* 8.5*  HCT 26.5* 25.7*  MCV 87.2 92.3  PLT 306 221      Lab Results  Component Value Date   HEPBSAG Negative 02/16/2017   HEPBSAB Non Reactive 02/16/2017      Microbiology:  Recent Results (from the  past 240 hour(s))  Urine Culture     Status: Abnormal   Collection Time: 02/16/17  4:15 PM  Result Value Ref Range Status   Specimen Description URINE, RANDOM  Final   Special Requests NONE  Final   Culture (A)  Final    >=100,000 COLONIES/mL CITROBACTER KOSERI 50,000 COLONIES/mL PSEUDOMONAS AERUGINOSA    Report Status 02/19/2017 FINAL  Final   Organism ID, Bacteria CITROBACTER KOSERI (A)  Final   Organism ID, Bacteria PSEUDOMONAS AERUGINOSA (A)  Final      Susceptibility   Citrobacter koseri - MIC*    CEFAZOLIN <=4 SENSITIVE Sensitive     CEFTRIAXONE <=1 SENSITIVE Sensitive     CIPROFLOXACIN <=0.25 SENSITIVE Sensitive     GENTAMICIN <=1 SENSITIVE Sensitive     IMIPENEM <=0.25 SENSITIVE Sensitive     NITROFURANTOIN 32 SENSITIVE Sensitive     TRIMETH/SULFA <=20 SENSITIVE Sensitive     PIP/TAZO <=4 SENSITIVE Sensitive     * >=100,000 COLONIES/mL CITROBACTER KOSERI   Pseudomonas aeruginosa - MIC*    CEFTAZIDIME 4 SENSITIVE Sensitive  CIPROFLOXACIN <=0.25 SENSITIVE Sensitive     GENTAMICIN <=1 SENSITIVE Sensitive     IMIPENEM 1 SENSITIVE Sensitive     PIP/TAZO 8 SENSITIVE Sensitive     CEFEPIME 2 SENSITIVE Sensitive     * 50,000 COLONIES/mL PSEUDOMONAS AERUGINOSA  Culture, blood (Routine X 2) w Reflex to ID Panel     Status: None   Collection Time: 02/16/17  8:42 PM  Result Value Ref Range Status   Specimen Description BLOOD LEFT ARM  Final   Special Requests   Final    BOTTLES DRAWN AEROBIC AND ANAEROBIC Blood Culture adequate volume   Culture NO GROWTH 5 DAYS  Final   Report Status 02/21/2017 FINAL  Final  Culture, blood (Routine X 2) w Reflex to ID Panel     Status: None   Collection Time: 02/16/17  8:52 PM  Result Value Ref Range Status   Specimen Description BLOOD LEFT HAND  Final   Special Requests   Final    BOTTLES DRAWN AEROBIC AND ANAEROBIC Blood Culture adequate volume   Culture NO GROWTH 5 DAYS  Final   Report Status 02/21/2017 FINAL  Final  MRSA PCR  Screening     Status: None   Collection Time: 02/16/17  9:00 PM  Result Value Ref Range Status   MRSA by PCR NEGATIVE NEGATIVE Final    Comment:        The GeneXpert MRSA Assay (FDA approved for NASAL specimens only), is one component of a comprehensive MRSA colonization surveillance program. It is not intended to diagnose MRSA infection nor to guide or monitor treatment for MRSA infections.     Coagulation Studies: No results for input(s): LABPROT, INR in the last 72 hours.  Urinalysis: No results for input(s): COLORURINE, LABSPEC, PHURINE, GLUCOSEU, HGBUR, BILIRUBINUR, KETONESUR, PROTEINUR, UROBILINOGEN, NITRITE, LEUKOCYTESUR in the last 72 hours.  Invalid input(s): APPERANCEUR    Imaging: No results found.   Medications:   . dextrose 5 % and 0.45% NaCl    . valproate sodium Stopped (02/24/17 1044)   . chlorhexidine gluconate (MEDLINE KIT)  15 mL Mouth Rinse BID  . diltiazem  30 mg Oral Q6H  . feeding supplement (NEPRO CARB STEADY)  237 mL Oral TID BM  . heparin  5,000 Units Subcutaneous Q8H  . mouth rinse  15 mL Mouth Rinse QID  . metoprolol tartrate  25 mg Oral BID  . multivitamin  1 tablet Oral QHS  . pantoprazole  40 mg Oral Daily  . QUEtiapine  100 mg Oral QHS     Assessment/ Plan:  56 y.o. AA male with COPD, HTN, Multinodular goiter, Schizoaffective disorder, CKD st 4, Primary hyperparathyroidism with hypercalcemia admitted for ARF  1. Acute renal failure on chronic kidney disease stage IV: baseline creatinine of 3.3, GFR of 23 from 01/27/17 - nonoliguric urine output but with increasing BUN and creatinine. Concern for uremic encephalopathy - Trial of hemodialysis treatment with no ultrafiltration.   2. Hypernatremia: free water deficit of 5.2 litres.  - Continue D51/2NS at 8m/hr - Na 143 on hemodialysis.   3. Anemia of chronic kidney disease: hemoglobin 8.5  - EPO with hemodialysis treatment.   4. Hypertension: blood pressure elevated.  -  diltiazem, metoprolol.    LOS: 8[place] [facility]7/23/20181:55 PM  C[address] Selby StreetBMoroni NBeaver Cross[place]

## 2017-02-24 NOTE — Progress Notes (Signed)
Pre hd assessment  

## 2017-02-24 NOTE — Progress Notes (Signed)
HD tx ended 1.5 hours early. Pt extreme restlessness. CVC will not function. Sitter present. Kolluru MD aware

## 2017-02-24 NOTE — Progress Notes (Signed)
Hd start, pt resting, no c/o, stable, sitter present

## 2017-02-24 NOTE — Progress Notes (Signed)
Primary RN called for IV Ativan d/t pt restlessness.

## 2017-02-24 NOTE — Progress Notes (Signed)
Pt restless, unable to lay still, sitter present, MD aware.

## 2017-02-24 NOTE — Progress Notes (Signed)
  Speech Language Pathology Treatment: Dysphagia  Patient Details Name: Jason Diaz MRN: [id number] DOB: 28-Jul-1961 Today's Date: 02/24/2017 Time: [id number] SLP Time Calculation (min) (ACUTE ONLY): 25 min  Assessment / Plan / Recommendation Clinical Impression  Pt is at increased risk for aspiration secondary to his declined Cognitive status impacting his overall engagement and ability to follow command and follow through with tasks. Pt is more alert this session, however is still not attending well to ST or people in room. Pt is not following commands. After mod-max verbal/tactile/visual cues, pt consumed po trials of Nectar consistency liquids via spoon w/ no immediate, overt s/s of aspiration noted; no decline in respiratory status following. However, pt was noted to hold boluses in mouth for a significant period of time despite max tactile and verbal cues. One pharyngeal swallow initiated, no residue or anterior spillage occurred. Attempted one tsp of puree, however pt did not close lips around spoon to accept and puree was then not placed in pt's mouth. Pt did not improve over trials and ST attempted 5 minute break and returned to attempt again, however pt was then somnolent and unresponsive to straw placed at lips.  Pt continues to exhibit the same blowing vocal noises as at evaluation but w/ less lingual protrusion; min discoordination and attention of breathing/swallowing. Suspect this behavior is related to his declined Cognitive status currently. Pt required full feeding assistance. No other trial consistencies were assessed due to pt's current mental status and risk of aspiration. Recommend NPO except for meds at this time d/t pt's increased risk of aspiration. Please use strict aspiration precautions with oral medications (crushed as able in puree). ST to continue to follow and provide trials to upgrade as appropriate as pt's Cognitive status improves.    HPI HPI: Pt  is a 56 y.o. male with  a known history of COPD not on oxygen, hypertension, multinodular goiter, schizoaffective disorder, CK D stage IV, GERD who lives in a group home was sent to the hospital referred by his nephrologist. acute renal failure-on CKD stage IV versus progression of CKD. Pt is nonverbal and not following any commands consistently; confusion and poor engagement.       SLP Plan  Continue with current plan of care       Recommendations  Diet recommendations: NPO;Other(comment) (except meds) Liquids provided via: Teaspoon Medication Administration: Crushed with puree Supervision: Full supervision/cueing for compensatory strategies Compensations: Minimize environmental distractions;Slow rate;Small sips/bites;Other (Comment) (check mouth for pocketing) Postural Changes and/or Swallow Maneuvers: Seated upright 90 degrees;Upright 30-60 min after meal                General recommendations: Other(comment) (dietitian f/u) Oral Care Recommendations: Oral care BID;Staff/trained caregiver to provide oral care Follow up Recommendations: Skilled Nursing facility SLP Visit Diagnosis: Dysphagia, oropharyngeal phase (R13.12) Plan: Continue with current plan of care       GO                Jason Diaz 02/24/2017, 11:43 AM

## 2017-02-24 NOTE — Progress Notes (Signed)
  End of hd 

## 2017-02-25 LAB — RENAL FUNCTION PANEL
Albumin: 2.6 g/dL — ABNORMAL LOW (ref 3.5–5.0)
Anion gap: 11 (ref 5–15)
BUN: 46 mg/dL — AB (ref 6–20)
CALCIUM: 11.2 mg/dL — AB (ref 8.9–10.3)
CHLORIDE: 112 mmol/L — AB (ref 101–111)
CO2: 26 mmol/L (ref 22–32)
CREATININE: 5.53 mg/dL — AB (ref 0.61–1.24)
GFR, EST AFRICAN AMERICAN: 12 mL/min — AB (ref >60)
GFR, EST NON AFRICAN AMERICAN: 11 mL/min — AB (ref >60)
Glucose, Bld: 100 mg/dL — ABNORMAL HIGH (ref 65–99)
Phosphorus: 4.1 mg/dL (ref 2.5–4.6)
Potassium: 3.9 mmol/L (ref 3.5–5.1)
SODIUM: 149 mmol/L — AB (ref 135–145)

## 2017-02-25 LAB — VALPROIC ACID LEVEL: VALPROIC ACID LVL: 112 ug/mL — AB (ref 50.0–100.0)

## 2017-02-25 LAB — GLUCOSE, CAPILLARY: Glucose-Capillary: 93 mg/dL (ref 65–99)

## 2017-02-25 MED ORDER — VALPROATE SODIUM 500 MG/5ML IV SOLN
1000.0000 mg | Freq: Two times a day (BID) | INTRAVENOUS | Status: DC
  Administered 2017-02-25 – 2017-02-26 (×2): 1000 mg via INTRAVENOUS
  Filled 2017-02-25 (×3): qty 10

## 2017-02-25 NOTE — Care Management Important Message (Signed)
Important Message  Patient Details  Name: Jason Diaz MRN: [id number] Date of Birth: March 28, 1961   Medicare Important Message Given:  Yes  Reviewed over the phone w patient's mother. Copy left at bedside.  Enriquez,Irene Mother [phone]       Lawerance SabalDebbie Carmine Carrozza, RN 02/25/2017, 3:13 PM

## 2017-02-25 NOTE — Progress Notes (Signed)
Subjective: Patient s/p dialysis on yesterday.  Had to be prematurely interrupted due to agitation.  Creatinine improved.  Patient currently working with speech therapy.  Able to take some food.  Some words expressed as well.    Objective: Current vital signs: BP (!) 138/97 (BP Location: Left Arm)   Pulse (!) 112   Temp 98.9 F (37.2 C) (Oral)   Resp (!) 24   Ht 5' 10"  (1.778 m)   Wt 90 kg (198 lb 6.6 oz)   SpO2 99%   BMI 28.47 kg/m  Vital signs in last 24 hours: Temp:  [98.1 F (36.7 C)-99.6 F (37.6 C)] 98.9 F (37.2 C) (07/23 2059) Pulse Rate:  [93-135] 112 (07/23 2059) Resp:  [18-33] 24 (07/23 1824) BP: (120-165)/(72-111) 138/97 (07/23 2059) SpO2:  [88 %-100 %] 99 % (07/23 2059) Weight:  [90 kg (198 lb 6.6 oz)] 90 kg (198 lb 6.6 oz) (07/23 1555)  Intake/Output from previous day: 07/23 0701 - 07/24 0700 In: 1126 [I.V.:1064; IV Piggyback:62] Out: 1610 [Urine:1700; Stool:2] Intake/Output this shift: No intake/output data recorded. Nutritional status: Diet NPO time specified Except for: Sips with Meds, Other (See Comments)  Neurologic Exam: Mental Status: Awake.  Taking food at times.  Says a word or two but much of what is says is quite dysarthric.    Cranial Nerves: II: Pupils equal, round, reactive to light and accommodation III,IV, VI: Intact oculocephalic maneuvers.  Tracks. V,VII: corneals intactbilaterally VIII: unable to be tested IX,X: gag reflex present XI: unable to be tested XII: unable to be tested Motor: Moves all extremities against gravity.   Sensory: Responds to light stimuli throughout  Lab Results: Basic Metabolic Panel:  Recent Labs Lab 02/20/17 1001 02/21/17 1518 02/23/17 1013 02/24/17 0938 02/25/17 0753  NA 150* 146* 152* 155* 149*  K 3.1* 2.7* 3.5 4.3 3.9  CL 109 109 115* 118* 112*  CO2 24 28 26 24 26   GLUCOSE 85 109* 116* 125* 100*  BUN 65* 52* 66* 69* 46*  CREATININE 8.08* 5.85* 7.46* 7.22* 5.53*  CALCIUM 10.8* 9.8 11.5*  11.8* 11.2*  PHOS  --  4.1  --   --  4.1    Liver Function Tests:  Recent Labs Lab 02/24/17 0938 02/25/17 0753  AST 19  --   ALT 20  --   ALKPHOS 76  --   BILITOT 0.5  --   PROT 7.4  --   ALBUMIN 2.5* 2.6*   No results for input(s): LIPASE, AMYLASE in the last 168 hours. No results for input(s): AMMONIA in the last 168 hours.  CBC:  Recent Labs Lab 02/20/17 1001 02/24/17 0443  WBC 20.3* 10.9*  HGB 8.8* 8.5*  HCT 26.5* 25.7*  MCV 87.2 92.3  PLT 306 221    Cardiac Enzymes: No results for input(s): CKTOTAL, CKMB, CKMBINDEX, TROPONINI in the last 168 hours.  Lipid Panel: No results for input(s): CHOL, TRIG, HDL, CHOLHDL, VLDL, LDLCALC in the last 168 hours.  CBG:  Recent Labs Lab 02/18/17 1141  GLUCAP 89    Microbiology: Results for orders placed or performed during the hospital encounter of 02/16/17  Urine Culture     Status: Abnormal   Collection Time: 02/16/17  4:15 PM  Result Value Ref Range Status   Specimen Description URINE, RANDOM  Final   Special Requests NONE  Final   Culture (A)  Final    >=100,000 COLONIES/mL CITROBACTER KOSERI 50,000 COLONIES/mL PSEUDOMONAS AERUGINOSA    Report Status 02/19/2017 FINAL  Final  Organism ID, Bacteria CITROBACTER KOSERI (A)  Final   Organism ID, Bacteria PSEUDOMONAS AERUGINOSA (A)  Final      Susceptibility   Citrobacter koseri - MIC*    CEFAZOLIN <=4 SENSITIVE Sensitive     CEFTRIAXONE <=1 SENSITIVE Sensitive     CIPROFLOXACIN <=0.25 SENSITIVE Sensitive     GENTAMICIN <=1 SENSITIVE Sensitive     IMIPENEM <=0.25 SENSITIVE Sensitive     NITROFURANTOIN 32 SENSITIVE Sensitive     TRIMETH/SULFA <=20 SENSITIVE Sensitive     PIP/TAZO <=4 SENSITIVE Sensitive     * >=100,000 COLONIES/mL CITROBACTER KOSERI   Pseudomonas aeruginosa - MIC*    CEFTAZIDIME 4 SENSITIVE Sensitive     CIPROFLOXACIN <=0.25 SENSITIVE Sensitive     GENTAMICIN <=1 SENSITIVE Sensitive     IMIPENEM 1 SENSITIVE Sensitive     PIP/TAZO 8  SENSITIVE Sensitive     CEFEPIME 2 SENSITIVE Sensitive     * 50,000 COLONIES/mL PSEUDOMONAS AERUGINOSA  Culture, blood (Routine X 2) w Reflex to ID Panel     Status: None   Collection Time: 02/16/17  8:42 PM  Result Value Ref Range Status   Specimen Description BLOOD LEFT ARM  Final   Special Requests   Final    BOTTLES DRAWN AEROBIC AND ANAEROBIC Blood Culture adequate volume   Culture NO GROWTH 5 DAYS  Final   Report Status 02/21/2017 FINAL  Final  Culture, blood (Routine X 2) w Reflex to ID Panel     Status: None   Collection Time: 02/16/17  8:52 PM  Result Value Ref Range Status   Specimen Description BLOOD LEFT HAND  Final   Special Requests   Final    BOTTLES DRAWN AEROBIC AND ANAEROBIC Blood Culture adequate volume   Culture NO GROWTH 5 DAYS  Final   Report Status 02/21/2017 FINAL  Final  MRSA PCR Screening     Status: None   Collection Time: 02/16/17  9:00 PM  Result Value Ref Range Status   MRSA by PCR NEGATIVE NEGATIVE Final    Comment:        The GeneXpert MRSA Assay (FDA approved for NASAL specimens only), is one component of a comprehensive MRSA colonization surveillance program. It is not intended to diagnose MRSA infection nor to guide or monitor treatment for MRSA infections.     Coagulation Studies: No results for input(s): LABPROT, INR in the last 72 hours.  Imaging: No results found.  Medications:  I have reviewed the patient's current medications. Scheduled: . chlorhexidine gluconate (MEDLINE KIT)  15 mL Mouth Rinse BID  . diltiazem  30 mg Oral Q6H  . [START ON 02/26/2017] epoetin (EPOGEN/PROCRIT) injection  10,000 Units Intravenous Q M,W,F-HD  . feeding supplement (NEPRO CARB STEADY)  237 mL Oral TID BM  . heparin  5,000 Units Subcutaneous Q8H  . mouth rinse  15 mL Mouth Rinse QID  . metoprolol tartrate  25 mg Oral BID  . multivitamin  1 tablet Oral QHS  . pantoprazole  40 mg Oral Daily  . QUEtiapine  100 mg Oral QHS     Assessment/Plan: Patient continues with some improvement.  No seizure activity noted.  Remains on Depakote.  Level 112.    Recommendations: 1.  Decrease Depakote to 1039m BID 2.  Depakote level in AM   LOS: 9 days   LAlexis Goodell MD Neurology [phone]/24/2018  9:59 AM

## 2017-02-25 NOTE — Consult Note (Signed)
[facility] Face-to-Face Psychiatry Consult   Reason for Consult:  Consult for 56 year old man with a history of schizophrenia who was brought into the hospital after a fall and injury and then required intubation. Referring Physician:  Posey Pronto Patient Identification: YITZCHAK KOTHARI MRN:  [id number] Principal Diagnosis: Acute delirium Diagnosis:   Patient Active Problem List   Diagnosis Date Noted  . Acute renal failure superimposed on chronic kidney disease ([place]) [N17.9, N18.9]   . Palliative care by specialist [Z51.5]   . Goals of care, counseling/discussion [Z71.89]   . Acute delirium [R41.0] 02/19/2017  . Acute respiratory failure ([place]) [J96.00]   . Seizure-like activity ([place]) [R56.9]   . Sepsis ([place]) [A41.9]   . Septic shock ([place]) [A41.9, R65.21] 02/16/2017  . Schizoaffective disorder ([place]) [F25.9] 02/16/2017  . Hypercalcemia [E83.52] 01/27/2017  . Hematuria [R31.9] 01/27/2017  . Acute urinary retention [R33.8] 01/27/2017  . Hyponatremia [E87.1] 01/27/2017  . Hypokalemia [E87.6] 01/27/2017  . ARF (acute renal failure) ([place]) [N17.9] 01/23/2017    Total Time spent with patient: 20 minutes  Subjective:   CLEVESTER HELZER is a 56 y.o. male patient admitted with patient is not able to communicate.   02/22/17:The patient remained sedated and is nonresponsive to this Probation officer. Per the sitter at the bedside, he has been mumbling at times. No seizure activity noted. He has been taking some medications. Although prior notes indicate that he is not on any psychotropic medications, according to the [facility] the patient was getting Seroquel 200 mg by mouth twice a day and Prolixin 5 mg by mouth nightly.  02/23/17: The patient has been more alert today but is not interacting with this Probation officer. The sitter has been at the bedside and says that he has fairly calm. He has only received 1 when necessary Ativan today and no prn Haldol. VPA level was toxic at 110. The group home supervisor was at the bedside and was  in the process of faxing the [facility] to the hospital.   02/24/2017. Mr. Simmering remains nonverbal but according to his nurse he is somewhat more awake and agitated. Neurology noticed slight improvement as well. We will not offer any medication changes today.  02/25/2017. Mr. Crumpacker seems slightly better. He was observed having ice cream for lunch today and interacting with a family friend giving her a little bit of "high five". He attempts to speak but utters single words. There is some eye contact. Unfortunately, yesterday he was unable to complete dialysis due to agitation. We will continue low dose Seroquel. Free VPA is still pending.  HPI:  Attempted to interview patient. Reviewed old chart. This is a 56 year old man who carries a diagnosis of schizophrenia or schizoaffective disorder and who lives in a group home. He was brought to the hospital after reportedly having a fall and hitting his head. He was thought to have some seizure-like activity but was also found to be an acute worsening renal failure. Patient was intubated and was in the ICU briefly and has now been extubated and moved to the floor. Patient on evaluation had his eyes open and appeared to be awake but was not able to verbally interact. He was able to answer yes to a couple of questions but was not able to say anything else and was not able to follow direct commands or say his name. Affect flat and somewhat confused.  Social history: According to the chart he lives in a group home or assisted living type setting. He reports that they're his  family at times that might be involved but the closest communication seems to be with the manager of the facility.  Medical history: Chronic renal failure which has worsened in the hospital. Possible new seizure activity.  Substance abuse history: No information  Past Psychiatric History: We don't have any previous psychiatric information other than the list of medicines that he was on. It looks like he  was on quite a bit of psychiatric medicine including 3 antipsychotics between the Seroquel risperdal and Prolixin. We don't have any record of prior hospitalizations or symptoms at baseline.  Risk to Self: Is patient at risk for suicide?: No Risk to Others:   Prior Inpatient Therapy:   Prior Outpatient Therapy:    Past Medical History:  Past Medical History:  Diagnosis Date  . CKD (chronic kidney disease), stage IV ([place])   . COPD (chronic obstructive pulmonary disease) ([place])   . GERD (gastroesophageal reflux disease)   . Hypertension   . Multinodular goiter   . Schizoaffective disorder [facility])     Past Surgical History:  Procedure Laterality Date  . TONSILLECTOMY    . TOOTH EXTRACTION     Family History:  Family History  Problem Relation Age of Onset  . Kidney disease Father   . Diabetes Father    Family Psychiatric  History: Unknown Social History:  History  Alcohol Use No     History  Drug Use No    Social History   Social History  . Marital status: Single    Spouse name: N/A  . Number of children: N/A  . Years of education: N/A   Social History Main Topics  . Smoking status: Current Every Day Smoker    Packs/day: 0.25    Types: Cigarettes  . Smokeless tobacco: Never Used  . Alcohol use No  . Drug use: No  . Sexual activity: Not Asked   Other Topics Concern  . None   Social History Narrative   From [place] group home       Allergies:  No Known Allergies  Labs:  Results for orders placed or performed during the hospital encounter of 02/16/17 (from the past 48 hour(s))  CBC     Status: Abnormal   Collection Time: 02/24/17  4:43 AM  Result Value Ref Range   WBC 10.9 (H) 3.8 - 10.6 K/uL   RBC 2.79 (L) 4.40 - 5.90 MIL/uL   Hemoglobin 8.5 (L) 13.0 - 18.0 g/dL   HCT 25.7 (L) 40.0 - 52.0 %   MCV 92.3 80.0 - 100.0 fL   MCH 30.5 26.0 - 34.0 pg   MCHC 33.1 32.0 - 36.0 g/dL   RDW 15.3 (H) 11.5 - 14.5 %   Platelets 221 150 - 440 K/uL    Comprehensive metabolic panel     Status: Abnormal   Collection Time: 02/24/17  9:38 AM  Result Value Ref Range   Sodium 155 (H) 135 - 145 mmol/L   Potassium 4.3 3.5 - 5.1 mmol/L   Chloride 118 (H) 101 - 111 mmol/L   CO2 24 22 - 32 mmol/L   Glucose, Bld 125 (H) 65 - 99 mg/dL   BUN 69 (H) 6 - 20 mg/dL   Creatinine, Ser 7.22 (H) 0.61 - 1.24 mg/dL   Calcium 11.8 (H) 8.9 - 10.3 mg/dL   Total Protein 7.4 6.5 - 8.1 g/dL   Albumin 2.5 (L) 3.5 - 5.0 g/dL   AST 19 15 - 41 U/L   ALT 20 17 -  63 U/L   Alkaline Phosphatase 76 38 - 126 U/L   Total Bilirubin 0.5 0.3 - 1.2 mg/dL   GFR calc non Af Amer 8 (L) >60 mL/min   GFR calc Af Amer 9 (L) >60 mL/min    Comment: (NOTE) The eGFR has been calculated using the CKD EPI equation. This calculation has not been validated in all clinical situations. eGFR's persistently <60 mL/min signify possible Chronic Kidney Disease.    Anion gap 13 5 - 15  Valproic acid level     Status: Abnormal   Collection Time: 02/25/17  4:14 AM  Result Value Ref Range   Valproic Acid Lvl 112 (H) 50.0 - 100.0 ug/mL  Renal function panel     Status: Abnormal   Collection Time: 02/25/17  7:53 AM  Result Value Ref Range   Sodium 149 (H) 135 - 145 mmol/L   Potassium 3.9 3.5 - 5.1 mmol/L   Chloride 112 (H) 101 - 111 mmol/L   CO2 26 22 - 32 mmol/L   Glucose, Bld 100 (H) 65 - 99 mg/dL   BUN 46 (H) 6 - 20 mg/dL   Creatinine, Ser 5.53 (H) 0.61 - 1.24 mg/dL   Calcium 11.2 (H) 8.9 - 10.3 mg/dL   Phosphorus 4.1 2.5 - 4.6 mg/dL   Albumin 2.6 (L) 3.5 - 5.0 g/dL   GFR calc non Af Amer 11 (L) >60 mL/min   GFR calc Af Amer 12 (L) >60 mL/min    Comment: (NOTE) The eGFR has been calculated using the CKD EPI equation. This calculation has not been validated in all clinical situations. eGFR's persistently <60 mL/min signify possible Chronic Kidney Disease.    Anion gap 11 5 - 15    Current Facility-Administered Medications  Medication Dose Route Frequency Provider Last Rate  Last Dose  . acetaminophen (TYLENOL) tablet 650 mg  650 mg Oral Q6H PRN Fritzi Mandes, MD       Or  . acetaminophen (TYLENOL) suppository 650 mg  650 mg Rectal Q6H PRN Fritzi Mandes, MD   650 mg at 02/19/17 2034  . bisacodyl (DULCOLAX) suppository 10 mg  10 mg Rectal Daily PRN Tukov, Magadalene S, NP      . budesonide (PULMICORT) nebulizer solution 0.25 mg  0.25 mg Nebulization BID PRN Awilda Bill, NP      . chlorhexidine gluconate (MEDLINE KIT) (PERIDEX) 0.12 % solution 15 mL  15 mL Mouth Rinse BID Tukov, Magadalene S, NP   15 mL at 02/24/17 0800  . diltiazem (CARDIZEM) tablet 30 mg  30 mg Oral Q6H Fritzi Mandes, MD   30 mg at 02/25/17 0814  . [START ON 02/26/2017] epoetin alfa (EPOGEN,PROCRIT) injection 10,000 Units  10,000 Units Intravenous Q M,W,F-HD Kolluru, Sarath, MD   10,000 Units at 02/24/17 1747  . feeding supplement (NEPRO CARB STEADY) liquid 237 mL  237 mL Oral TID BM Fritzi Mandes, MD   Stopped at 02/25/17 1000  . haloperidol (HALDOL) 2 MG/ML solution 2 mg  2 mg Oral Q8H PRN Chauncey Mann, MD      . heparin injection 5,000 Units  5,000 Units Subcutaneous Q8H Fritzi Mandes, MD   5,000 Units at 02/25/17 4496  . hydrALAZINE (APRESOLINE) injection 10-20 mg  10-20 mg Intravenous Q4H PRN Wilhelmina Mcardle, MD   10 mg at 02/19/17 1539  . ipratropium-albuterol (DUONEB) 0.5-2.5 (3) MG/3ML nebulizer solution 3 mL  3 mL Nebulization Q4H PRN Awilda Bill, NP      . LORazepam (ATIVAN)  injection 2 mg  2 mg Intravenous Q6H PRN Saundra Shelling, MD   2 mg at 02/24/17 1714  . MEDLINE mouth rinse  15 mL Mouth Rinse QID Dorene Sorrow S, NP   15 mL at 02/25/17 0410  . metoprolol tartrate (LOPRESSOR) injection 2.5-5 mg  2.5-5 mg Intravenous Q3H PRN Wilhelmina Mcardle, MD   2.5 mg at 02/20/17 1116  . metoprolol tartrate (LOPRESSOR) tablet 25 mg  25 mg Oral BID Saundra Shelling, MD   25 mg at 02/25/17 1010  . multivitamin (RENA-VIT) tablet 1 tablet  1 tablet Oral QHS Fritzi Mandes, MD   1 tablet at 02/23/17 2118    . pantoprazole (PROTONIX) EC tablet 40 mg  40 mg Oral Daily Wilhelmina Mcardle, MD   Stopped at 02/25/17 1000  . QUEtiapine (SEROQUEL) tablet 100 mg  100 mg Oral QHS Chauncey Mann, MD   100 mg at 02/23/17 2118  . sennosides (SENOKOT) 8.8 MG/5ML syrup 5 mL  5 mL Per Tube BID PRN Tukov, Magadalene S, NP      . sodium chloride flush (NS) 0.9 % injection 10-40 mL  10-40 mL Intracatheter PRN Fritzi Mandes, MD      . valproate (DEPACON) 1,000 mg in dextrose 5 % 50 mL IVPB  1,000 mg Intravenous Q12H Alexis Goodell, MD        Musculoskeletal: Strength & Muscle Tone: decreased Gait & Station: unable to stand Patient leans: N/A  Psychiatric Specialty Exam: Physical Exam  Nursing note and vitals reviewed. Constitutional: He appears well-developed and well-nourished.  HENT:  Head: Normocephalic and atraumatic.  Eyes: Pupils are equal, round, and reactive to light. Conjunctivae are normal.  Neck: Normal range of motion.  Cardiovascular: Normal heart sounds.   Respiratory: Effort normal.  GI: Soft.  Genitourinary:     Musculoskeletal: Normal range of motion.  Neurological: He is alert. He displays tremor. He exhibits abnormal muscle tone.  Skin: Skin is warm and dry.  Psychiatric: His affect is blunt. He is agitated. Cognition and memory are impaired. He is noncommunicative.    Review of Systems  Unable to perform ROS: Medical condition    Blood pressure (!) 156/91, pulse (!) 108, temperature 98.9 F (37.2 C), temperature source Oral, resp. rate (!) 24, height _0  (1.778 m), weight 90 kg (198 lb 6.6 oz), SpO2 100 %.Body mass index is 28.47 kg/m.  General Appearance: Disheveled  Eye Contact:  Minimal  Speech:  Garbled  Volume:  Decreased  Mood:  Unable to assess  Affect:  Flat; Sedated  Thought Process:  NA  Orientation:  Negative  Thought Content:  Negative  Suicidal Thoughts:  No  Homicidal Thoughts:  No  Memory:  Negative  Judgement:  Negative  Insight:  Negative   Psychomotor Activity:  Negative  Concentration:  Concentration: Negative  Recall:  Negative  Fund of Knowledge:  Negative  Language:  Negative  Akathisia:  Negative  Handed:  Right  AIMS (if indicated):     Assets:  Housing  ADL's:  Impaired  Cognition:  Impaired,  Severe  Sleep:        Treatment Plan Summary:   Delirium, multifactorial in origin  R/O Depakote toxicity  Daily contact with patient to assess and evaluate symptoms and progress in treatment, Medication management and Plan 56 year old man with a history of chronic mental illness. Currently unresponsive. Multiple probable etiologies including infection possible neurologic condition. Can't rule out catatonia although I suspect it would be one factor among many.  Patient is not receiving his oral antipsychotics because he is unable to take oral medicine which is maintaining on the one hand that it is not possible that these are causing oversedation but on the other hand it is possible that he could be suffering from a lack of his antipsychotic. At this point I will probably error on the side of not restarting any acute antipsychotic while he is still so medically sick but will sign this out to the doctor on call and continue to follow-up regularly.   Error noted: For several days, notes were indicating that the patient was not receiving antipsychotic but according to the [facility] he was receiving Seroquel. Seroquel and Prolixin discontinued on this 02/22/17 in an attempt to see if the patient was more arousable.. He has only received when necessary Ativan since yesterday and no when necessary Haldol. For now, will continue when necessary Ativan and Haldol and restart Seroquel at 100 mg by mouth nightly for mood stabilization. Valproic acid level was 110 this morning and will check FREE valproic acid level. Must rule out Depakote toxicity as a contributing factor for sedation. Sitter remains at bedside and the patient continues to be  delirious. Per the group home manager who is at the bedside, no mental status changes at the group home prior to admission. He is far from his baseline.  Disposition: Patient does not meet criteria for psychiatric inpatient admission. Supportive therapy provided about ongoing stressors.  Orson Slick, MD 02/25/2017 2:25 PM

## 2017-02-25 NOTE — Progress Notes (Addendum)
Nutrition Follow-up  DOCUMENTATION CODES:   Not applicable  INTERVENTION:  1. Advanced to full liquids, nectar thick per SLP - will not meet needs currently given AMS, lethargy.  2. Monitor GOC, recommend enteral feeds if palliative/comfort care is not pursued 3. If enteral feeds are  initiated, recommend Nepro at 3820mL/hr increase by 10 every 8 hours to goal rate of 6850mL/hr, Prostat 30mL daily  Regimen provides 2260 calories, 112 grams of protein, 872mL free water 4. Continue rena-vit 5. Continue Nepro Shake po TID, each supplement provides 425 kcal and 19 grams protein 6. Mighty Shake II TID with meals, each supplement provides 480-500 kcals and 20-23 grams of protein  NUTRITION DIAGNOSIS:   Inadequate oral intake related to  (altered mental status) as evidenced by meal completion < 50%. -ongoing  GOAL:   Patient will meet greater than or equal to 90% of their needs -not meeting  MONITOR:   PO intake, Supplement acceptance, Labs, Weight trends, I & O's  ASSESSMENT:   Patient with PMH of CKD stg IV, baseline creatinine 3.4, GERD, Hypertension, slipped and fell on his urine, possible seizure, presented with acute metabolic encephelopathy secondary to UTI and septic shock, acute on chronic renal failure, metabolic acidosis requiring emergent HD Advanced to full liquids, Nectar thick this morning via SLP Discussed nutrition POC with MD and RN. Recommended NGT to be placed for short term enteral feeds, Patient will not meet needs via PO intake at this time.Ongoing GOC discussion with family, if unable to pursue palliative/comfort care route, patient will need nutrition support.  Patient has been agitated, extremely restless - had to end HD 1.5 hours early yesterday. He has been unable to tolerate outpatient dialysis for this reason. Refused NePro shake this morning.  Intake/Output Summary (Last 24 hours) at 02/25/17 1222 Last data filed at 02/25/17 [id number]  Gross per 24 hour  Intake              1126 ml  Output             1151 ml  Net              -25 ml  Medications reviewed and include:  EPO MWF with HD Labs reviewed:  Na 149, BUN/Creatinine 46/5.53  Diet Order:  Diet full liquid Room service appropriate? Yes; Fluid consistency: Nectar Thick  Skin:  Reviewed, no issues  Last BM:  7/18  Height:   Ht Readings from Last 1 Encounters:  02/16/17 5\' 10"  (1.778 m)    Weight:   Wt Readings from Last 1 Encounters:  02/24/17 198 lb 6.6 oz (90 kg)    Ideal Body Weight:  75.45 kg  BMI:  Body mass index is 28.47 kg/m.  Estimated Nutritional Needs:   Kcal:  2200-2500kcak/day   Protein:  105-121g/day   Fluid:  per MD  EDUCATION NEEDS:   Education needs no appropriate at this time  Dionne AnoWilliam M. Nyasia Baxley, MS, RD LDN Inpatient Clinical Dietitian Pager [phone]

## 2017-02-25 NOTE — Progress Notes (Signed)
[facility] -  at [facility]   PATIENT NAME: Jason RockersMichael Diaz    MR#:  [id number]  DATE OF BIRTH:  1960-11-01  SUBJECTIVE:  noncommunicative,Resting quietly. Sitter in the room. Has not eaten much. He is drinking some thickened liquidPer speech therapist. Mother in the room.  REVIEW OF SYSTEMS:   Review of Systems  Constitutional: Negative for chills, fever and weight loss.  HENT: Negative for ear discharge, ear pain and nosebleeds.   Eyes: Negative for blurred vision, pain and discharge.  Respiratory: Negative for sputum production, shortness of breath, wheezing and stridor.   Cardiovascular: Negative for chest pain, palpitations, orthopnea and PND.  Gastrointestinal: Negative for abdominal pain, diarrhea, nausea and vomiting.  Genitourinary: Negative for frequency and urgency.  Musculoskeletal: Negative for back pain and joint pain.  Neurological: Positive for weakness. Negative for sensory change, speech change and focal weakness.  Psychiatric/Behavioral: Negative for depression and hallucinations. The patient is not nervous/anxious.    Tolerating Diet: not much Tolerating PT: pending  DRUG ALLERGIES:  No Known Allergies  VITALS:  Blood pressure (!) 156/91, pulse (!) 108, temperature 98.9 F (37.2 C), temperature source Oral, resp. rate (!) 24, height 5\' 10"  (1.778 m), weight 90 kg (198 lb 6.6 oz), SpO2 100 %.  PHYSICAL EXAMINATION:   Physical Exam very limited exam Limited exam due to pt particpation and mental status GENERAL:  56 y.o.-year-old patient lying in the bed with no acute distress. Chronically ill HEENT: Head atraumatic, normocephalic. Oropharynx and nasopharynx clear. NECK:  Supple, no jugular venous distention. No thyroid enlargement, no tenderness.  LUNGS: Normal breath sounds bilaterally, no wheezing, rales, rhonchi. No use of accessory muscles of respiration.  CARDIOVASCULAR: S1, S2 normal. No murmurs, rubs, or gallops.   ABDOMEN: Soft, nontender, nondistended. Bowel sounds present. No organomegaly or mass.  EXTREMITIES: No cyanosis, clubbing or edema b/l.    NEUROLOGIC:moves extremities well spontaneously, not communicative PSYCHIATRIC: Resting SKIN: No obvious rash, lesion, or ulcer.   LABORATORY PANEL:  CBC  Recent Labs Lab 02/24/17 0443  WBC 10.9*  HGB 8.5*  HCT 25.7*  PLT 221    Chemistries   Recent Labs Lab 02/24/17 0938 02/25/17 0753  NA 155* 149*  K 4.3 3.9  CL 118* 112*  CO2 24 26  GLUCOSE 125* 100*  BUN 69* 46*  CREATININE 7.22* 5.53*  CALCIUM 11.8* 11.2*  AST 19  --   ALT 20  --   ALKPHOS 76  --   BILITOT 0.5  --    Cardiac Enzymes No results for input(s): TROPONINI in the last 168 hours. RADIOLOGY:  No results found. ASSESSMENT AND PLAN:  Jason Diaz  is a 56 y.o. male with a known history of Chronic kidney disease stage IV baseline creatinine around 3.4, GERD, schizoaffective disorder., Hypertension [facility] after patient slipping in his urine causing him to fall and hitting his head on the nightstand. Staff noted shaking questionable seizure received 4 mg of Versed. Patient was quite confused came to the emergency room and was found to be in septic shock. His white count was elevated to 17,000 was tachycardic and creatinine was 15.4 with potassium of 7.5.  1. Acute metabolic encephalopathy multifactorial secondary to septic shock, acute on chronic severe renal failure with dehydration -Patient was intubated placed on the ventilator---extubated 02/18/17 -Continue to monitor for seizure activity. -Appreciate neurology consultation. Patient on IV Depakote---can we change to oral depakote  2. Septic shock source appears  urine -Patient was discharged 01/28/2017 with an indwelling Foley catheter given significant hematuria and difficulty urination. He was supposed to follow with urology not sure if he made to the  appointment -IV cefepime.(completed treatment) - urine culture growing more than 100,000 citrobacter and pseudomonas -blood culture--->  negative.  3. Acute on chronic kidney failure stage IV/5 -Baseline creatinine 3.35------ came in with creatinine of 15.49--- continue hemodialysis---7.27-- 8.08--5.53 -Nephrology consultation appreciated. D/w Jason Diaz to see if we can dialyze a few days to see if mentation improves -patient has challenges to sit still for 3 hours. -Patient had required sedation yesterday to keep him calm for dialysis. At this point along discussion was made by me and Jason. Wynelle Diaz regarding long-term dialysis need and patient is able to sit calm and quiet for 3-4 hours. Mother understands sedation is not a choice as an outpatient and patient has to be sitting up in the dialysis chair. She also understands we don't have much options at this point and would like to discuss with palliative care.  4. Acute severe hyperkalemia--resolved  5. Leukocytosis due to #2  6. DVT prophylaxis subcutaneous heparin  7. Possible seizures versus jerky/tremor movements and to uremic encephalopathy -Empirically on IV depakote -Appreciate neurology input. -EEG done and showing triphasic activity waveforms---cont anti seizure meds  8. Acute delirium/agitation with history of schizoaffective disorder -psychiatry consultation by Jason Diaz who recommends to hold off Prolixin and decreased Seroquel  To 100 mg daily -Patient currently followed by Jason Diaz  Patient has been having periods of agitation and restlessness  And being noncompliant with his meds intermittently Palliative consultation appreciated. They spoke with patient's mother Jason Diaz and see if they can rearrange meeting again. Patient for now is a full code.  Long-term patient is a very poor prognosis.   Case discussed with Care Management/Social Worker. Management plans discussed with the patient, family and they are in  agreement.  CODE STATUS: full  DVT Prophylaxis: heparin  TOTAL TIME TAKING CARE OF THIS PATIENT: *25* minutes.  >50% time spent on counselling and coordination of care    Note: This dictation was prepared with Dragon dictation along with smaller phrase technology. Any transcriptional errors that result from this process are unintentional.  Sarin Comunale M.D on 02/25/2017 at 3:21 PM  Between 7am to 6pm - Pager - [phone]  After 6pm go to [email or web address] - Social research officer, government  [facility]  Office  [phone]  CC: Primary care physician; Armando Gang, FNP

## 2017-02-25 NOTE — Progress Notes (Signed)
  Speech Language Pathology Treatment: Dysphagia  Patient Details Name: Jason Diaz MRN: [id number] DOB: [birth year] Today's Date: 02/25/2017 Time: 0935-1000 SLP Time Calculation (min) (ACUTE ONLY): 25 min  Assessment / Plan / Recommendation Clinical Impression  Pt provided today with trials of puree and nectar thick liquids. Sitter in room with pt. Nsg reports that pt did take his meds with thickened liquids this morning. Pt was alert when entering room and did make eye contact a few times with ST but not really tracking. Pt given 2 tsps of puree and demonstrated significant oral holding of bolus. Pt required max verba/tactile/visual cues to initiate a swallow. No overt s/s of aspiration were observed after pharyngeal swallow. Pt presented with nectar thick liquids via straw. Pt was able to take 3 sips of nectar from straw and demonstrated good A-P transfer and timely pharyngeal swallow. No overt s/s of aspiration were observed w/nectar thick by straw. Pt then became fatigued and ST attempted no further PO trials. Pt remains at risk of aspiration d/t mental status. Recommend a full liquid diet of nectar thick only with strict aspiration precautions. Pt may take nectar thick liquids via straw which appeared to decrease oral holding. MD and nsg in agreement. Will f/u in 1-2 days.    HPI HPI: Pt  is a 56 y.o. male with a known history of COPD not on oxygen, hypertension, multinodular goiter, schizoaffective disorder, CK D stage IV, GERD who lives in a group home was sent to the hospital referred by his nephrologist. acute renal failure-on CKD stage IV versus progression of CKD. Pt is nonverbal and not following any commands consistently; confusion and poor engagement.       SLP Plan  Continue with current plan of care       Recommendations  Diet recommendations: Nectar-thick liquid;Other(comment) (Full liquid (nectar thick only)) Liquids provided via: Straw Medication Administration: Other  (Comment) (Crushed w/nectar) Supervision: Full supervision/cueing for compensatory strategies;Staff to assist with self feeding Compensations: Minimize environmental distractions;Slow rate;Small sips/bites;Lingual sweep for clearance of pocketing;Other (Comment) (Check mouth for pocketing) Postural Changes and/or Swallow Maneuvers: Seated upright 90 degrees;Upright 30-60 min after meal                Oral Care Recommendations: Oral care BID;Staff/trained caregiver to provide oral care Follow up Recommendations: Skilled Nursing facility SLP Visit Diagnosis: Dysphagia, oropharyngeal phase (R13.12) Plan: Continue with current plan of care       GO                Marshall,Maaran 02/25/2017, 10:09 AM

## 2017-02-25 NOTE — Progress Notes (Signed)
[place], [place] 02/25/17  Subjective:   Sitter at bedside.   Patient did not tolerate hemodialysis yesterday. Not cooperative.   Objective:  Vital signs in last 24 hours:  Temp:  [98.1 F (36.7 C)-99.5 F (37.5 C)] 98.9 F (37.2 C) (07/23 2059) Pulse Rate:  [93-135] 108 (07/24 1214) Resp:  [18-33] 24 (07/23 1824) BP: (120-159)/(72-111) 156/91 (07/24 1214) SpO2:  [88 %-100 %] 100 % (07/24 1214) Weight:  [90 kg (198 lb 6.6 oz)] 90 kg (198 lb 6.6 oz) (07/23 1555)  Weight change:  Filed Weights   02/20/17 0549 02/20/17 1909 02/24/17 1555  Weight: 81.1 kg (178 lb 12.8 oz) 80.7 kg (178 lb) 90 kg (198 lb 6.6 oz)    Intake/Output:    Intake/Output Summary (Last 24 hours) at 02/25/17 1549 Last data filed at 02/25/17 1328  Gross per 24 hour  Intake             1126 ml  Output             1151 ml  Net              -25 ml     Physical Exam: General: No acute distress   HEENT Normocephalic/atraumatic, old mucosal moist   Neck supple  Pulm/lungs Scattered rhonchi, normal respiratory effort   CVS/Heart Regular; no rub  Abdomen:  Soft, distended, Bowel sounds present   Extremities: No edema  Neurologic: Lethargic, not following commands  Skin: No acute rashes  GU Foley in place with yellow urine  Access:  Right femoral temp HD catheter    Basic Metabolic Panel:   Recent Labs Lab 02/20/17 1001 02/21/17 1518 02/23/17 1013 02/24/17 0938 02/25/17 0753  NA 150* 146* 152* 155* 149*  K 3.1* 2.7* 3.5 4.3 3.9  CL 109 109 115* 118* 112*  CO2 24 28 26 24 26   GLUCOSE 85 109* 116* 125* 100*  BUN 65* 52* 66* 69* 46*  CREATININE 8.08* 5.85* 7.46* 7.22* 5.53*  CALCIUM 10.8* 9.8 11.5* 11.8* 11.2*  PHOS  --  4.1  --   --  4.1     CBC:  Recent Labs Lab 02/20/17 1001 02/24/17 0443  WBC 20.3* 10.9*  HGB 8.8* 8.5*  HCT 26.5* 25.7*  MCV 87.2 92.3  PLT 306 221      Lab Results  Component Value Date   HEPBSAG Negative 02/16/2017   HEPBSAB Non Reactive 02/16/2017      Microbiology:  Recent Results (from the past 240 hour(s))  Urine Culture     Status: Abnormal   Collection Time: 02/16/17  4:15 PM  Result Value Ref Range Status   Specimen Description URINE, RANDOM  Final   Special Requests NONE  Final   Culture (A)  Final    >=100,000 COLONIES/mL CITROBACTER KOSERI 50,000 COLONIES/mL PSEUDOMONAS AERUGINOSA    Report Status 02/19/2017 FINAL  Final   Organism ID, Bacteria CITROBACTER KOSERI (A)  Final   Organism ID, Bacteria PSEUDOMONAS AERUGINOSA (A)  Final      Susceptibility   Citrobacter koseri - MIC*    CEFAZOLIN <=4 SENSITIVE Sensitive     CEFTRIAXONE <=1 SENSITIVE Sensitive     CIPROFLOXACIN <=0.25 SENSITIVE Sensitive     GENTAMICIN <=1 SENSITIVE Sensitive     IMIPENEM <=0.25 SENSITIVE Sensitive     NITROFURANTOIN 32 SENSITIVE Sensitive     TRIMETH/SULFA <=20 SENSITIVE Sensitive     PIP/TAZO <=4 SENSITIVE Sensitive     * >=100,000 COLONIES/mL CITROBACTER  KOSERI   Pseudomonas aeruginosa - MIC*    CEFTAZIDIME 4 SENSITIVE Sensitive     CIPROFLOXACIN <=0.25 SENSITIVE Sensitive     GENTAMICIN <=1 SENSITIVE Sensitive     IMIPENEM 1 SENSITIVE Sensitive     PIP/TAZO 8 SENSITIVE Sensitive     CEFEPIME 2 SENSITIVE Sensitive     * 50,000 COLONIES/mL PSEUDOMONAS AERUGINOSA  Culture, blood (Routine X 2) w Reflex to ID Panel     Status: None   Collection Time: 02/16/17  8:42 PM  Result Value Ref Range Status   Specimen Description BLOOD LEFT ARM  Final   Special Requests   Final    BOTTLES DRAWN AEROBIC AND ANAEROBIC Blood Culture adequate volume   Culture NO GROWTH 5 DAYS  Final   Report Status 02/21/2017 FINAL  Final  Culture, blood (Routine X 2) w Reflex to ID Panel     Status: None   Collection Time: 02/16/17  8:52 PM  Result Value Ref Range Status   Specimen Description BLOOD LEFT HAND  Final   Special Requests   Final    BOTTLES DRAWN AEROBIC AND ANAEROBIC Blood Culture adequate volume   Culture  NO GROWTH 5 DAYS  Final   Report Status 02/21/2017 FINAL  Final  MRSA PCR Screening     Status: None   Collection Time: 02/16/17  9:00 PM  Result Value Ref Range Status   MRSA by PCR NEGATIVE NEGATIVE Final    Comment:        The GeneXpert MRSA Assay (FDA approved for NASAL specimens only), is one component of a comprehensive MRSA colonization surveillance program. It is not intended to diagnose MRSA infection nor to guide or monitor treatment for MRSA infections.     Coagulation Studies: No results for input(s): LABPROT, INR in the last 72 hours.  Urinalysis: No results for input(s): COLORURINE, LABSPEC, PHURINE, GLUCOSEU, HGBUR, BILIRUBINUR, KETONESUR, PROTEINUR, UROBILINOGEN, NITRITE, LEUKOCYTESUR in the last 72 hours.  Invalid input(s): APPERANCEUR    Imaging: No results found.   Medications:   . valproate sodium     . chlorhexidine gluconate (MEDLINE KIT)  15 mL Mouth Rinse BID  . diltiazem  30 mg Oral Q6H  . [START ON 02/26/2017] epoetin (EPOGEN/PROCRIT) injection  10,000 Units Intravenous Q M,W,F-HD  . feeding supplement (NEPRO CARB STEADY)  237 mL Oral TID BM  . heparin  5,000 Units Subcutaneous Q8H  . mouth rinse  15 mL Mouth Rinse QID  . metoprolol tartrate  25 mg Oral BID  . multivitamin  1 tablet Oral QHS  . pantoprazole  40 mg Oral Daily  . QUEtiapine  100 mg Oral QHS     Assessment/ Plan:  56 y.o. AA male with COPD, HTN, Multinodular goiter, Schizoaffective disorder, CKD st 4, Primary hyperparathyroidism with hypercalcemia admitted for ARF  1. Acute renal failure on chronic kidney disease stage IV: baseline creatinine of 3.3, GFR of 23 from 01/27/17 Strong family history of ESRD with father currently on dialysis.  Not a candidate for long term outpatient hemodialysis due to inability to cooperate.   2. Hypernatremia: free water deficit of 1.6 litres.  Secondary to poor water intake.   3. Anemia of chronic kidney disease: hemoglobin 8.5  - EPO  given with hemodialysis treatment.   4. Hypertension: blood pressure elevated.  - diltiazem, metoprolol.    LOS: 9 Lavonia Dana 7/24/20183:49 PM  [address] [place], [place]

## 2017-02-26 DIAGNOSIS — G9349 Other encephalopathy: Secondary | ICD-10-CM

## 2017-02-26 DIAGNOSIS — G9341 Metabolic encephalopathy: Secondary | ICD-10-CM

## 2017-02-26 DIAGNOSIS — N19 Unspecified kidney failure: Secondary | ICD-10-CM

## 2017-02-26 DIAGNOSIS — Z66 Do not resuscitate: Secondary | ICD-10-CM

## 2017-02-26 LAB — GLUCOSE, CAPILLARY: GLUCOSE-CAPILLARY: 109 mg/dL — AB (ref 65–99)

## 2017-02-26 LAB — RENAL FUNCTION PANEL
ANION GAP: 12 (ref 5–15)
Albumin: 2.6 g/dL — ABNORMAL LOW (ref 3.5–5.0)
BUN: 60 mg/dL — ABNORMAL HIGH (ref 6–20)
CHLORIDE: 121 mmol/L — AB (ref 101–111)
CO2: 27 mmol/L (ref 22–32)
CREATININE: 6.48 mg/dL — AB (ref 0.61–1.24)
Calcium: 11.7 mg/dL — ABNORMAL HIGH (ref 8.9–10.3)
GFR calc non Af Amer: 9 mL/min — ABNORMAL LOW (ref >60)
GFR, EST AFRICAN AMERICAN: 10 mL/min — AB (ref >60)
GLUCOSE: 123 mg/dL — AB (ref 65–99)
Phosphorus: 4.4 mg/dL (ref 2.5–4.6)
Potassium: 4 mmol/L (ref 3.5–5.1)
SODIUM: 160 mmol/L — AB (ref 135–145)

## 2017-02-26 LAB — VALPROIC ACID LEVEL: Valproic Acid Lvl: 103 ug/mL — ABNORMAL HIGH (ref 50.0–100.0)

## 2017-02-26 MED ORDER — LORAZEPAM 2 MG/ML IJ SOLN
2.0000 mg | INTRAMUSCULAR | Status: DC | PRN
  Administered 2017-02-27: 2 mg via INTRAVENOUS
  Filled 2017-02-26: qty 1

## 2017-02-26 MED ORDER — HALOPERIDOL LACTATE 2 MG/ML PO CONC: 2.0000 mg | Freq: Four times a day (QID) | ORAL | Status: DC | PRN

## 2017-02-26 MED ORDER — VALPROATE SODIUM 500 MG/5ML IV SOLN
750.0000 mg | Freq: Two times a day (BID) | INTRAVENOUS | Status: DC
  Filled 2017-02-26: qty 7.5

## 2017-02-26 MED ORDER — MORPHINE SULFATE (CONCENTRATE) 10 MG/0.5ML PO SOLN: 5.0000 mg | ORAL | Status: DC | PRN

## 2017-02-26 NOTE — Progress Notes (Signed)
[facility] at [place] NAME: Jason Diaz    MR#:  [id number]  DATE OF BIRTH:  Jan 27, 1961  SUBJECTIVE:  noncommunicative,Resting quietly. Sitter in the room. Has not eaten much. He is drinking some thickened liquidPer speech therapist.  REVIEW OF SYSTEMS:   Review of Systems  Constitutional: Negative for chills, fever and weight loss.  HENT: Negative for ear discharge, ear pain and nosebleeds.   Eyes: Negative for blurred vision, pain and discharge.  Respiratory: Negative for sputum production, shortness of breath, wheezing and stridor.   Cardiovascular: Negative for chest pain, palpitations, orthopnea and PND.  Gastrointestinal: Negative for abdominal pain, diarrhea, nausea and vomiting.  Genitourinary: Negative for frequency and urgency.  Musculoskeletal: Negative for back pain and joint pain.  Neurological: Positive for weakness. Negative for sensory change, speech change and focal weakness.  Psychiatric/Behavioral: Negative for depression and hallucinations. The patient is not nervous/anxious.    Tolerating Diet: not much Tolerating PT: pending  DRUG ALLERGIES:  No Known Allergies  VITALS:  Blood pressure 127/68, pulse (!) 103, temperature 98.2 F (36.8 C), temperature source Oral, resp. rate 16, height 5' 10"  (1.778 m), weight 90 kg (198 lb 6.6 oz), SpO2 100 %.  PHYSICAL EXAMINATION:   Physical Exam very limited exam Limited exam due to pt particpation and mental status GENERAL:  56 y.o.-year-old patient lying in the bed with no acute distress. Chronically ill HEENT: Head atraumatic, normocephalic. Oropharynx and nasopharynx clear. NECK:  Supple, no jugular venous distention. No thyroid enlargement, no tenderness.  LUNGS: Normal breath sounds bilaterally, no wheezing, rales, rhonchi. No use of accessory muscles of respiration.  CARDIOVASCULAR: S1, S2 normal. No murmurs, rubs, or gallops.  ABDOMEN: Soft, nontender,  nondistended. Bowel sounds present. No organomegaly or mass.  EXTREMITIES: No cyanosis, clubbing or edema b/l.    NEUROLOGIC:moves extremities well spontaneously, not communicative PSYCHIATRIC: Resting SKIN: No obvious rash, lesion, or ulcer.   LABORATORY PANEL:  CBC  Recent Labs Lab 02/24/17 0443  WBC 10.9*  HGB 8.5*  HCT 25.7*  PLT 221    Chemistries   Recent Labs Lab 02/24/17 0938  02/26/17 1113  NA 155*  < > 160*  K 4.3  < > 4.0  CL 118*  < > 121*  CO2 24  < > 27  GLUCOSE 125*  < > 123*  BUN 69*  < > 60*  CREATININE 7.22*  < > 6.48*  CALCIUM 11.8*  < > 11.7*  AST 19  --   --   ALT 20  --   --   ALKPHOS 76  --   --   BILITOT 0.5  --   --   < > = values in this interval not displayed. Cardiac Enzymes No results for input(s): TROPONINI in the last 168 hours. RADIOLOGY:  No results found. ASSESSMENT AND PLAN:  Jason Diaz  is a 56 y.o. male with a known history of Chronic kidney disease stage IV baseline creatinine around 3.4, GERD, schizoaffective disorder., Hypertension comes to the emergency room from [place] after patient slipping in his urine causing him to fall and hitting his head on the nightstand. Staff noted shaking questionable seizure received 4 mg of Versed. Patient was quite confused came to the emergency room and was found to be in septic shock. His white count was elevated to 17,000 was tachycardic and creatinine was 15.4 with potassium of 7.5.  1. Acute metabolic encephalopathy multifactorial secondary to septic shock, acute  on chronic severe renal failure with dehydration -Patient was intubated placed on the ventilator---extubated 02/18/17  2. Septic shock source appears urine -Patient was discharged 01/28/2017 with an indwelling Foley catheter given significant hematuria and difficulty urination. He was supposed to follow with urology not sure if he made to the appointment -IV cefepime.(completed treatment) - urine culture growing  more than 100,000 citrobacter and pseudomonas -blood culture--->  negative.  3. Acute on chronic kidney failure stage IV/5 -Baseline creatinine 3.35------ came in with creatinine of 15.49--- continue hemodialysis---7.27-- 8.08--5.53  Long-term patient is a very poor prognosis. Palliative care met with mother Murray Hodgkins and she understands pt is overall declined and carries a poor long term prognosis. She is ok with hospice home placement. He is comfort care!  Pt is DNR/DNI  Case discussed with Care Management/Social Worker. Management plans discussed with the patient, family and they are in agreement.  CODE STATUS: DNR/DNI DVT Prophylaxis: heparin  TOTAL TIME TAKING CARE OF THIS PATIENT: *20* minutes.  >50% time spent on counselling and coordination of care    Note: This dictation was prepared with Dragon dictation along with smaller phrase technology. Any transcriptional errors that result from this process are unintentional.  Jason Diaz M.D on 02/26/2017 at 5:48 PM  Between 7am to 6pm - Pager - [phone]  After 6pm go to [email or web address] - password [email or web address] [facility]  Office  [phone]  CC: Primary care physician; Remi Haggard, FNP

## 2017-02-26 NOTE — Progress Notes (Signed)
[facility] following a Palliative Medicine Consult. Patient is a 56 year old man with a past medical history  of schizoaffective disorder, hypertension, GERD, COPD, CKD IV,   [facility]16/2017 with unresponsiveness. Patient slipped in his urine causing him to fall and hit his head on a nightstand. In ED, patient was found to be in septic shock with WBC 17,000, creatinine 15.4, and potassium 7.5. Patient was intubated/mechanically ventilated and CRRT initiated. He was subsequently extubated 7/17, He has remained agitated with delirium, requiring a bed side sitter and mitts. He has received IV antibiotics and hemodialysis was attempted on 7/23, but he continued to be agitated and it was stopped prior to completion. Palliative Medicine was consulted for goals of care and has been following. Due to his lack of improvement and poor oral intake family has chosen to focus on his comfort with transfer to the hospice home. Patient seen lying in bed, eyes open, did not focus when spoken to. Safety sitter at bedside, reports he has taken a few sips of his drink and a bite of magic cup. Patient appeared restless, reported to staff RN Ree KidaJack. Writer contacted patient's mother Reyes Ivanrene Avans to initiate education regarding hospice services, shew as unable to speak with this Clinical research associatewriter and requested a call back in "an hour". [facility] made aware.  Plan will be to transfer Mr. Arzola to the hospice home tomorrow 7/26. Writer to contact Mrs. Karges as requested. Patient information faxed to referral. Signed DNR in place on patient's chartThank you. Dayna BarkerKaren Robertson RN, BSN, [facility], hospital Liaison [phone] c

## 2017-02-26 NOTE — Progress Notes (Addendum)
Subjective: Patient speaking more with sitter but when I enter the room stops fidgeting and refuses to respond or make eye contact.  Objective: Current vital signs: BP 127/68 (BP Location: Left Arm)   Pulse (!) 103   Temp 98.2 F (36.8 C) (Oral)   Resp 16   Ht 5' 10"  (1.778 m)   Wt 90 kg (198 lb 6.6 oz)   SpO2 100%   BMI 28.47 kg/m  Vital signs in last 24 hours: Temp:  [97.7 F (36.5 C)-98.6 F (37 C)] 98.2 F (36.8 C) (07/25 1222) Pulse Rate:  [89-103] 103 (07/25 1222) Resp:  [16-20] 16 (07/25 1222) BP: (127-151)/(68-90) 127/68 (07/25 1222) SpO2:  [100 %] 100 % (07/25 1222)  Intake/Output from previous day: 07/24 0701 - 07/25 0700 In: 102 [P.O.:60; IV Piggyback:42] Out: 1500 [Urine:1500] Intake/Output this shift: No intake/output data recorded. Nutritional status: Diet full liquid Room service appropriate? Yes; Fluid consistency: Nectar Thick  Neurologic Exam: Mental Status: Awake.  No speech.  Does not follow commands   Cranial Nerves: II: Pupils equal, round, reactive to light and accommodation III,IV, VI: Intact oculocephalic maneuvers.  Tracks. V,VII: corneals intactbilaterally VIII: unable to be tested IX,X: gag reflex present XI: unable to be tested XII: unable to be tested Motor: Moves all extremities against gravity.  Sensory: Responds to lightstimuli throughout  Lab Results: Basic Metabolic Panel:  Recent Labs Lab 02/21/17 1518 02/23/17 1013 02/24/17 0938 02/25/17 0753 02/26/17 1113  NA 146* 152* 155* 149* 160*  K 2.7* 3.5 4.3 3.9 4.0  CL 109 115* 118* 112* 121*  CO2 28 26 24 26 27   GLUCOSE 109* 116* 125* 100* 123*  BUN 52* 66* 69* 46* 60*  CREATININE 5.85* 7.46* 7.22* 5.53* 6.48*  CALCIUM 9.8 11.5* 11.8* 11.2* 11.7*  PHOS 4.1  --   --  4.1 4.4    Liver Function Tests:  Recent Labs Lab 02/24/17 0938 02/25/17 0753 02/26/17 1113  AST 19  --   --   ALT 20  --   --   ALKPHOS 76  --   --   BILITOT 0.5  --   --   PROT 7.4  --    --   ALBUMIN 2.5* 2.6* 2.6*   No results for input(s): LIPASE, AMYLASE in the last 168 hours. No results for input(s): AMMONIA in the last 168 hours.  CBC:  Recent Labs Lab 02/20/17 1001 02/24/17 0443  WBC 20.3* 10.9*  HGB 8.8* 8.5*  HCT 26.5* 25.7*  MCV 87.2 92.3  PLT 306 221    Cardiac Enzymes: No results for input(s): CKTOTAL, CKMB, CKMBINDEX, TROPONINI in the last 168 hours.  Lipid Panel: No results for input(s): CHOL, TRIG, HDL, CHOLHDL, VLDL, LDLCALC in the last 168 hours.  CBG:  Recent Labs Lab 02/25/17 2047 02/26/17 0655  GLUCAP 93 109*    Microbiology: Results for orders placed or performed during the hospital encounter of 02/16/17  Urine Culture     Status: Abnormal   Collection Time: 02/16/17  4:15 PM  Result Value Ref Range Status   Specimen Description URINE, RANDOM  Final   Special Requests NONE  Final   Culture (A)  Final    >=100,000 COLONIES/mL CITROBACTER KOSERI 50,000 COLONIES/mL PSEUDOMONAS AERUGINOSA    Report Status 02/19/2017 FINAL  Final   Organism ID, Bacteria CITROBACTER KOSERI (A)  Final   Organism ID, Bacteria PSEUDOMONAS AERUGINOSA (A)  Final      Susceptibility   Citrobacter koseri - MIC*  CEFAZOLIN <=4 SENSITIVE Sensitive     CEFTRIAXONE <=1 SENSITIVE Sensitive     CIPROFLOXACIN <=0.25 SENSITIVE Sensitive     GENTAMICIN <=1 SENSITIVE Sensitive     IMIPENEM <=0.25 SENSITIVE Sensitive     NITROFURANTOIN 32 SENSITIVE Sensitive     TRIMETH/SULFA <=20 SENSITIVE Sensitive     PIP/TAZO <=4 SENSITIVE Sensitive     * >=100,000 COLONIES/mL CITROBACTER KOSERI   Pseudomonas aeruginosa - MIC*    CEFTAZIDIME 4 SENSITIVE Sensitive     CIPROFLOXACIN <=0.25 SENSITIVE Sensitive     GENTAMICIN <=1 SENSITIVE Sensitive     IMIPENEM 1 SENSITIVE Sensitive     PIP/TAZO 8 SENSITIVE Sensitive     CEFEPIME 2 SENSITIVE Sensitive     * 50,000 COLONIES/mL PSEUDOMONAS AERUGINOSA  Culture, blood (Routine X 2) w Reflex to ID Panel     Status: None    Collection Time: 02/16/17  8:42 PM  Result Value Ref Range Status   Specimen Description BLOOD LEFT ARM  Final   Special Requests   Final    BOTTLES DRAWN AEROBIC AND ANAEROBIC Blood Culture adequate volume   Culture NO GROWTH 5 DAYS  Final   Report Status 02/21/2017 FINAL  Final  Culture, blood (Routine X 2) w Reflex to ID Panel     Status: None   Collection Time: 02/16/17  8:52 PM  Result Value Ref Range Status   Specimen Description BLOOD LEFT HAND  Final   Special Requests   Final    BOTTLES DRAWN AEROBIC AND ANAEROBIC Blood Culture adequate volume   Culture NO GROWTH 5 DAYS  Final   Report Status 02/21/2017 FINAL  Final  MRSA PCR Screening     Status: None   Collection Time: 02/16/17  9:00 PM  Result Value Ref Range Status   MRSA by PCR NEGATIVE NEGATIVE Final    Comment:        The GeneXpert MRSA Assay (FDA approved for NASAL specimens only), is one component of a comprehensive MRSA colonization surveillance program. It is not intended to diagnose MRSA infection nor to guide or monitor treatment for MRSA infections.     Coagulation Studies: No results for input(s): LABPROT, INR in the last 72 hours.  Imaging: No results found.  Medications:  I have reviewed the patient's current medications. Scheduled: . chlorhexidine gluconate (MEDLINE KIT)  15 mL Mouth Rinse BID  . diltiazem  30 mg Oral Q6H  . metoprolol tartrate  25 mg Oral BID  . QUEtiapine  100 mg Oral QHS    Assessment/Plan: Patient going to hospice.  Currently on Depakote.  Not taking po well.  No GTC seizure activity noted.  Would discontinue since will not be able to continue po.     LOS: 10 days   Alexis Goodell, MD Neurology [phone] 02/26/2017  3:21 PM

## 2017-02-26 NOTE — Progress Notes (Signed)
[place], [place] 02/26/17  Subjective:   Sitter at bedside.   More verbal today  Objective:  Vital signs in last 24 hours:  Temp:  [97.7 F (36.5 C)-98.6 F (37 C)] 98.2 F (36.8 C) (07/25 1222) Pulse Rate:  [89-103] 103 (07/25 1222) Resp:  [16-20] 16 (07/25 1222) BP: (127-151)/(68-90) 127/68 (07/25 1222) SpO2:  [100 %] 100 % (07/25 1222)  Weight change:  Filed Weights   02/20/17 0549 02/20/17 1909 02/24/17 1555  Weight: 81.1 kg (178 lb 12.8 oz) 80.7 kg (178 lb) 90 kg (198 lb 6.6 oz)    Intake/Output:    Intake/Output Summary (Last 24 hours) at 02/26/17 1255 Last data filed at 02/26/17 0431  Gross per 24 hour  Intake              102 ml  Output             1500 ml  Net            -1398 ml     Physical Exam: General: No acute distress   HEENT Normocephalic/atraumatic, old mucosal moist   Neck supple  Pulm/lungs Scattered rhonchi, normal respiratory effort   CVS/Heart Regular; no rub  Abdomen:  Soft, distended, Bowel sounds present   Extremities: No edema  Neurologic: Lethargic, not following commands  Skin: No acute rashes  GU Foley in place with yellow urine  Access:  Right femoral temp HD catheter    Basic Metabolic Panel:   Recent Labs Lab 02/21/17 1518 02/23/17 1013 02/24/17 0938 02/25/17 0753 02/26/17 1113  NA 146* 152* 155* 149* 160*  K 2.7* 3.5 4.3 3.9 4.0  CL 109 115* 118* 112* 121*  CO2 28 26 24 26 27   GLUCOSE 109* 116* 125* 100* 123*  BUN 52* 66* 69* 46* 60*  CREATININE 5.85* 7.46* 7.22* 5.53* 6.48*  CALCIUM 9.8 11.5* 11.8* 11.2* 11.7*  PHOS 4.1  --   --  4.1 4.4     CBC:  Recent Labs Lab 02/20/17 1001 02/24/17 0443  WBC 20.3* 10.9*  HGB 8.8* 8.5*  HCT 26.5* 25.7*  MCV 87.2 92.3  PLT 306 221      Lab Results  Component Value Date   HEPBSAG Negative 02/16/2017   HEPBSAB Non Reactive 02/16/2017      Microbiology:  Recent Results (from the past 240 hour(s))  Urine Culture      Status: Abnormal   Collection Time: 02/16/17  4:15 PM  Result Value Ref Range Status   Specimen Description URINE, RANDOM  Final   Special Requests NONE  Final   Culture (A)  Final    >=100,000 COLONIES/mL CITROBACTER KOSERI 50,000 COLONIES/mL PSEUDOMONAS AERUGINOSA    Report Status 02/19/2017 FINAL  Final   Organism ID, Bacteria CITROBACTER KOSERI (A)  Final   Organism ID, Bacteria PSEUDOMONAS AERUGINOSA (A)  Final      Susceptibility   Citrobacter koseri - MIC*    CEFAZOLIN <=4 SENSITIVE Sensitive     CEFTRIAXONE <=1 SENSITIVE Sensitive     CIPROFLOXACIN <=0.25 SENSITIVE Sensitive     GENTAMICIN <=1 SENSITIVE Sensitive     IMIPENEM <=0.25 SENSITIVE Sensitive     NITROFURANTOIN 32 SENSITIVE Sensitive     TRIMETH/SULFA <=20 SENSITIVE Sensitive     PIP/TAZO <=4 SENSITIVE Sensitive     * >=100,000 COLONIES/mL CITROBACTER KOSERI   Pseudomonas aeruginosa - MIC*    CEFTAZIDIME 4 SENSITIVE Sensitive     CIPROFLOXACIN <=0.25 SENSITIVE Sensitive  GENTAMICIN <=1 SENSITIVE Sensitive     IMIPENEM 1 SENSITIVE Sensitive     PIP/TAZO 8 SENSITIVE Sensitive     CEFEPIME 2 SENSITIVE Sensitive     * 50,000 COLONIES/mL PSEUDOMONAS AERUGINOSA  Culture, blood (Routine X 2) w Reflex to ID Panel     Status: None   Collection Time: 02/16/17  8:42 PM  Result Value Ref Range Status   Specimen Description BLOOD LEFT ARM  Final   Special Requests   Final    BOTTLES DRAWN AEROBIC AND ANAEROBIC Blood Culture adequate volume   Culture NO GROWTH 5 DAYS  Final   Report Status 02/21/2017 FINAL  Final  Culture, blood (Routine X 2) w Reflex to ID Panel     Status: None   Collection Time: 02/16/17  8:52 PM  Result Value Ref Range Status   Specimen Description BLOOD LEFT HAND  Final   Special Requests   Final    BOTTLES DRAWN AEROBIC AND ANAEROBIC Blood Culture adequate volume   Culture NO GROWTH 5 DAYS  Final   Report Status 02/21/2017 FINAL  Final  MRSA PCR Screening     Status: None   Collection  Time: 02/16/17  9:00 PM  Result Value Ref Range Status   MRSA by PCR NEGATIVE NEGATIVE Final    Comment:        The GeneXpert MRSA Assay (FDA approved for NASAL specimens only), is one component of a comprehensive MRSA colonization surveillance program. It is not intended to diagnose MRSA infection nor to guide or monitor treatment for MRSA infections.     Coagulation Studies: No results for input(s): LABPROT, INR in the last 72 hours.  Urinalysis: No results for input(s): COLORURINE, LABSPEC, PHURINE, GLUCOSEU, HGBUR, BILIRUBINUR, KETONESUR, PROTEINUR, UROBILINOGEN, NITRITE, LEUKOCYTESUR in the last 72 hours.  Invalid input(s): APPERANCEUR    Imaging: No results found.   Medications:   . valproate sodium     . chlorhexidine gluconate (MEDLINE KIT)  15 mL Mouth Rinse BID  . diltiazem  30 mg Oral Q6H  . feeding supplement (NEPRO CARB STEADY)  237 mL Oral TID BM  . mouth rinse  15 mL Mouth Rinse QID  . metoprolol tartrate  25 mg Oral BID  . QUEtiapine  100 mg Oral QHS     Assessment/ Plan:  56 y.o. AA male with COPD, HTN, Multinodular goiter, Schizoaffective disorder, CKD st 4, Primary hyperparathyroidism with hypercalcemia admitted for ARF  1. Acute renal failure on chronic kidney disease stage IV: baseline creatinine of 3.3, GFR of 23 from 01/27/17 Strong family history of ESRD with father currently on dialysis.  Not a candidate for long term outpatient hemodialysis due to inability to cooperate. Creatinine continues to get worse.  Nonoliguric urine output  2. Hypernatremia: with free water deficit   Secondary to poor water intake.   3. Anemia of chronic kidney disease: hemoglobin 8.5  - EPO given with hemodialysis treatment.   4. Hypertension: blood pressure elevated.  - diltiazem, metoprolol.    LOS: [place], [place] 7/25/201812:55 PM  [place], [place]

## 2017-02-26 NOTE — Progress Notes (Signed)
Speech Language Pathology Dysphagia Treatment Patient Details Name: Jason Diaz MRN: [id number] DOB: 01/25/1961 Today's Date: 02/26/2017 Time: [id number] SLP Time Calculation (min) (ACUTE ONLY): 19 min  Assessment / Plan / Recommendation Clinical Impression   pt was given trials of nectar thick liquids via spoon and straw as well as trials of puree to assess safety with current diet and attempt upgraded trials. Pt was able to swallow x5 sips nectar thick liquids as well as x 4 bites puree with need for presentation of empty spoon to initiate swallow of puree trials. Pt with oral clearance with presence of empty spoon. Pt became agitated during intake and slp discontintued trials. slp recommends to continue nectar thick liquid diet only at this time.     Diet Recommendation    Full liquid via straw- nectar thick.    SLP Plan Continue with current plan of care   Pertinent Vitals/Pain None reported   Swallowing Goals     General Behavior/Cognition: Confused;Requires cueing;Distractible;Doesn't follow directions Patient Positioning: Upright in bed Oral care provided: N/A HPI: Pt  is a 56 y.o. male with a known history of COPD not on oxygen, hypertension, multinodular goiter, schizoaffective disorder, CK D stage IV, GERD who lives in a group home was sent to the hospital referred by his nephrologist. acute renal failure-on CKD stage IV versus progression of CKD. Pt is nonverbal and not following any commands consistently; confusion and poor engagement.   Oral Cavity - Oral Hygiene     Dysphagia Treatment Family/Caregiver Educated: [facility] caregiver Treatment Methods: Skilled observation;Upgraded PO texture trial;Patient/caregiver education Patient observed directly with PO's: Yes Type of PO's observed: Dysphagia 1 (puree);Nectar-thick liquids Feeding: Total assist Liquids provided via: Straw;Cup;Teaspoon Oral Phase Signs & Symptoms: Oral holding;Prolonged bolus formation Type of  cueing: Verbal;Visual;Tactile Amount of cueing: Maximal   GO     Jason PelStacie Harris Diaz 02/26/2017, 11:06 AM

## 2017-02-26 NOTE — Progress Notes (Signed)
Daily Progress Note   Patient Name: Jason Diaz       Date: 02/26/2017 DOB: July 23, 1961  Age: 56 y.o. MRN#: [id number] Attending Physician: Fritzi Mandes, MD Primary Care Physician: Remi Haggard, FNP Admit Date: 02/16/2017  Reason for Consultation/Follow-up: Establishing goals of care  Subjective/GOC: Patient awake but nonverbal and will not follow commands. Has been intermittently restless throughout the day per sitter at bedside.   Met with mother, Jason Diaz in family waiting room. Again introduced palliative medicine. Discussed baseline prior to hospitalization. Per mother, patient overdosed on pills in college which contributed to his schizoaffective disorder. He has lived in group homes since college. She speaks of a decline in the last few weeks with very poor appetite and decreased baseline cognition. Two falls at nursing facility.   Discussed hospital diagnoses and interventions. Jason Diaz's understands he is not a candidate for long-term dialysis and also with very poor nutritional status/not appropriate for feeding tube. Knowing this, she understands poor prognosis and need for hospice services.   Discussed hospice services with focus on comfort, quality, and dignity. Educated on medications as needed for symptom management. Educated on hospice philosophy allowing a natural death--DNR/DNI. Discussed discontinuation of medications, labs, and interventions not aimed at comfort. Mother understands and agrees. She speaks of their strong faith as Jehovah witnesses. She believes he will close his eyes and be relieved of suffering when he dies.  Answered questions. Therapeutic listening and emotional/spiritual support provided.    Length of Stay: 10  Current Medications: Scheduled Meds:    . chlorhexidine gluconate (MEDLINE KIT)  15 mL Mouth Rinse BID  . diltiazem  30 mg Oral Q6H  . metoprolol tartrate  25 mg Oral BID  . QUEtiapine  100 mg Oral QHS    Continuous Infusions:   PRN Meds: acetaminophen **OR** acetaminophen, bisacodyl, budesonide (PULMICORT) nebulizer solution, haloperidol, hydrALAZINE, ipratropium-albuterol, LORazepam, metoprolol tartrate, morphine CONCENTRATE, sodium chloride flush  Physical Exam  Constitutional: He is uncooperative. He is easily aroused. He appears ill.  HENT:  Head: Normocephalic and atraumatic.  Cardiovascular: Tachycardia present.   Pulmonary/Chest: No accessory muscle usage. No tachypnea. No respiratory distress. He has decreased breath sounds.  Abdominal: Normal appearance. There is tenderness.  Neurological: He is easily aroused. He is disoriented.  Skin: Skin is warm and dry.  Psychiatric: He  is withdrawn. Cognition and memory are impaired. He is inattentive.  Nursing note and vitals reviewed.          Vital Signs: BP 127/68 (BP Location: Left Arm)   Pulse (!) 103   Temp 98.2 F (36.8 C) (Oral)   Resp 16   Ht 5' 10"  (1.778 m)   Wt 90 kg (198 lb 6.6 oz)   SpO2 100%   BMI 28.47 kg/m  SpO2: SpO2: 100 % O2 Device: O2 Device: Not Delivered O2 Flow Rate: O2 Flow Rate (L/min): 0 L/min  Intake/output summary:   Intake/Output Summary (Last 24 hours) at 02/26/17 1338 Last data filed at 02/26/17 0431  Gross per 24 hour  Intake              102 ml  Output             1500 ml  Net            -1398 ml   LBM: Last BM Date: 02/25/17 Baseline Weight: Weight: 81.6 kg (180 lb) Most recent weight: Weight: 90 kg (198 lb 6.6 oz)       Palliative Assessment/Data: PPS 20%   Flowsheet Rows     Most Recent Value  Intake Tab  Referral Department  Hospitalist  Unit at Time of Referral  Cardiac/Telemetry Unit  Palliative Care Primary Diagnosis  Sepsis/Infectious Disease  Date Notified  02/20/17  Palliative Care Type  New  Palliative care  Reason for referral  Clarify Goals of Care  Date of Admission  02/16/17  Date first seen by Palliative Care  02/20/17  # of days IP prior to Palliative referral  4  Clinical Assessment  Palliative Performance Scale Score  20%  Psychosocial & Spiritual Assessment  Palliative Care Outcomes  Patient/Family meeting held?  Yes  Who was at the meeting?  mother  Palliative Care Outcomes  Clarified goals of care, Provided end of life care assistance, Provided psychosocial or spiritual support, Changed CPR status, Transitioned to hospice, ACP counseling assistance, Counseled regarding hospice, Changed to focus on comfort, Improved pain interventions, Improved non-pain symptom therapy      Patient Active Problem List   Diagnosis Date Noted  . Acute renal failure superimposed on chronic kidney disease ([place])   . Palliative care by specialist   . Goals of care, counseling/discussion   . Acute delirium 02/19/2017  . Acute respiratory failure ([place])   . Seizure-like activity ([place])   . Sepsis ([place])   . Septic shock ([place]) 02/16/2017  . Schizoaffective disorder ([place]) 02/16/2017  . Hypercalcemia 01/27/2017  . Hematuria 01/27/2017  . Acute urinary retention 01/27/2017  . Hyponatremia 01/27/2017  . Hypokalemia 01/27/2017  . ARF (acute renal failure) ([place]) 01/23/2017    Palliative Care Assessment & Plan   Patient Profile: 56 y.o. male  with past medical history of schizoaffective disorder, hypertension, GERD, COPD, CKD IV admitted on 02/16/2017 with unresponsiveness from nursing facility. Patient slipped in his urine causing him to fall and hit his head on nightstand. Per staff, shaking/possible seizure. Received 63m Versed. In ED, patient in septic shock with WBC 17,000, creatinine 15.4, and potassium 7.5. Patient was intubated/mechanically ventilated and CRRT initiated. Extubated 7/17 and stable for transfer to floor. Nephrology following-monitoring kidney functions. Will likely need  further dialysis. Neurology following and receiving Depakote. Psych also following for history of schizoaffective disorder. Palliative medicine consultation for goals of care.   Assessment: Acute metabolic encephalopathy/uremic encephalopathy Septic shock secondary to UTI Acute on chronic  severe renal failure  Dehydration Hyperkalemia resolved Leukocytosis Schizoaffective disorder  Recommendations/Plan:  Transition to comfort measures only. Discontinued medications/labs/interventions not aimed at comfort. Discontinue femoral line. Remove mitts if possible.   DNR/DNI now  Symptom management  Haldol 456m IV q6h prn agitation  Ativan 270mIV q4h prn anxiety/agitation  Roxanol 56m56mL q2h prn pain/dyspnea/air hunger  Continue Seroquel if patient will take pills.   SW consult for residential hospice facility. Stable for transfer when bed available.   Goals of Care and Additional Recommendations:  Limitations on Scope of Treatment: Full Comfort Care  Code Status: DNR/DNI   Code Status Orders        Start     Ordered   02/16/17 2036  Full code  Continuous     02/16/17 2035    Code Status History    Date Active Date Inactive Code Status Order ID Comments User Context   01/23/2017  6:53 PM 01/28/2017  8:06 PM Full Code [id number] LighterD Inpatient       Prognosis:   < 2 weeks: septic shock, acute renal failure secondary to chronic kidney disease (not a candidate for long term outpatient hemodialysis), uremic encephalopathy, severe functional and nutritional status decline, and schizoaffective disorder.   Discharge Planning:  Hospice facility: symptom management for agitation. He has required sitter throughout hospitalization.   Care plan was discussed with mother, RN, Dr. PatPosey Prontohank you for allowing the Palliative Medicine Team to assist in the care of this patient.   Time In: 1215 Time Out: 1330 Total Time 756m37mrolonged Time Billed yes        Greater than 50%  of this time was spent counseling and coordinating care related to the above assessment and plan.  MegaIhor DowP-C Palliative Medicine Team  Phone: [phone]: [phone] contact Palliative Medicine Team phone at [phone] questions and concerns.

## 2017-02-27 MED ORDER — DILTIAZEM HCL 30 MG PO TABS: 30.0000 mg | ORAL_TABLET | Freq: Four times a day (QID) | ORAL | Status: AC

## 2017-02-27 MED ORDER — QUETIAPINE FUMARATE 100 MG PO TABS: 100.0000 mg | ORAL_TABLET | Freq: Every day | ORAL | Status: AC

## 2017-02-27 MED ORDER — MORPHINE SULFATE 20 MG/5ML PO SOLN: 5.0000 mg | ORAL | 0 refills | Status: DC | PRN

## 2017-02-27 MED ORDER — MORPHINE SULFATE (CONCENTRATE) 10 MG/0.5ML PO SOLN: 5.0000 mg | ORAL | Status: AC | PRN

## 2017-02-27 MED ORDER — LORAZEPAM 1 MG PO TABS: 1.0000 mg | ORAL_TABLET | ORAL | 0 refills | Status: AC | PRN

## 2017-02-27 MED ORDER — HALOPERIDOL LACTATE 5 MG/ML IJ SOLN
2.0000 mg | Freq: Once | INTRAMUSCULAR | Status: AC
  Administered 2017-02-27: 2 mg via INTRAVENOUS
  Filled 2017-02-27: qty 0.4

## 2017-02-27 NOTE — Progress Notes (Signed)
Patient discharged to hospice home with IV and foley in place, per request of Dayna BarkerKaren Robertson, RN, [facility].  Haldol administered via IV prior to transport per MD order.

## 2017-02-27 NOTE — Progress Notes (Signed)
Follow up visit to new hospice home referral. Patient sitting up in bed, eyes closed, appeared to be resting, safety sitter remains at bedside, mittens in place. He required 2 mg of IV lorazepam early this morning given for agitation. Plan is for discharge to the hospice home this morning. Report called to the hospice home, EMS notified for transport. Discharge summary faxed to referral. Signed DNR in place in discharge packet. Thank you. Dayna BarkerKaren Robertson RN, BSN, [facility] [place] [place], [place] Lia[place] [phone] c

## 2017-02-27 NOTE — Discharge Summary (Signed)
[facility] - [place] [facility]  Jason ArabMichael D Diaz, [place]56 y.o., DOB [birth year], MRN [id number]. Admission date: 02/16/2017 Discharge Date 02/27/2017 Primary MD Jason GangLindley, Jason P, FNP Admitting Physician Enedina FinnerSona Mivaan Corbitt, MD  Admission Diagnosis  Acute respiratory failure (HCC) [J96.00] SIRS (systemic inflammatory response syndrome) (HCC) [R65.10] High anion gap metabolic acidosis [E87.2] Seizure-like activity (HCC) [R56.9] Acute renal failure, unspecified acute renal failure type (HCC) [N17.9]  Discharge Diagnosis   Principal Problem:   Acute delirium due to septic shock    Septic shock (HCC)   Schizoaffective disorder (HCC)   Acute respiratory failure (HCC)   Seizure-like activity (HCC)   Sepsis (HCC)   Acute renal failure superimposed on 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 Patient had slipped in his urine causing him to fall and hit his head. Patient in the emergency room was noted to be in septic shock with acute renal failure and hyperkalemia. Patient was admitted to the ICU. And require intubation. She will he was on mechanical ventilation. As well as CRRT. Patient was subsequently extubated and remained agitated with delirium. He is complete her course of antibiotics for urinary tract infection. Patient for his renal failure was dialyzed. He continued to have metabolic encephalopathy and no significant improvements he was seen in consultation by palliative care would discuss with his mother and made him comfort care and plans to transfer hospice home.             Consults   nephrology. pulmonary  Significant Tests:  See full reports for all details     Dg Abd 1 View  Result Date: 02/17/2017 CLINICAL DATA:  Initial evaluation for OG tube placement. EXAM: ABDOMEN - 1 VIEW COMPARISON:  None. FINDINGS: Enteric tube visualized at the upper abdomen, tip in side hole overlying the stomach. Tip projects distally. Visualized bowel gas pattern within normal limits without evidence for obstruction or ileus. No abnormal bowel wall thickening. No soft tissue mass or abnormal calcification. IMPRESSION: Tip in side hole of enteric tube overlying the stomach, beyond the GE junction. Tip projects distally. Electronically Signed   By: Rise MuBenjamin  McClintock M.D.   On: 02/17/2017 19:23   Ct Head Wo Contrast  Result Date: 02/21/2017 CLINICAL DATA:  Post fall on July 15th.  Patient now with lethargy. EXAM: CT HEAD WITHOUT CONTRAST TECHNIQUE: Contiguous axial images were obtained from the base of the skull through the vertex without intravenous contrast. COMPARISON:  02/16/2017 FINDINGS: Brain: Similar findings of age advanced atrophy with sulcal prominence and and centralized volume loss with ex vacuo dilatation of the ventricular system. Unchanged scattered periventricular hypodensities compatible with microvascular ischemic disease. The gray-white differentiation is otherwise well maintained without CT evidence of acute large territory infarct. No intraparenchymal or extra-axial mass or hemorrhage. Unchanged size and configuration the ventricles and the basilar cisterns. No midline shift Vascular: No hyperdense vessel or unexpected calcification. Skull: No displaced calvarial fracture. Sinuses/Orbits: Underpneumatization of the bilateral frontal sinuses. The remaining paranasal sinuses and mastoid air cells are normally aerated. Other: Regional soft tissues appear normal. IMPRESSION: Similar findings of age advanced atrophy and mild microvascular ischemic disease without  acute intracranial  process. Electronically Signed   By: Simonne Come M.D.   On: 02/21/2017 14:32   Ct Head Wo Contrast  Result Date: 02/16/2017 CLINICAL DATA:  Fall. Hit head on night stand. Seizure. Persistent confusion. EXAM: CT HEAD WITHOUT CONTRAST CT MAXILLOFACIAL WITHOUT CONTRAST CT CERVICAL SPINE WITHOUT CONTRAST TECHNIQUE: Multidetector CT imaging of the head, cervical spine, and maxillofacial structures were performed using the standard protocol without intravenous contrast. Multiplanar CT image reconstructions of the cervical spine and maxillofacial structures were also generated. COMPARISON:  None. FINDINGS: CT HEAD FINDINGS Brain: Mild generalized atrophy is present. No acute infarct, hemorrhage, or mass lesion is present. The ventricles are of normal size. No significant extraaxial fluid collection is present. The brainstem and cerebellum are normal. The internal auditory canals are within normal limits. Vascular: No hyperdense vessel or unexpected calcification. Skull: Left periorbital soft tissue swelling is noted. There is no underlying fracture. The calvarium is intact. No significant extracranial soft tissue injury is present otherwise. CT MAXILLOFACIAL FINDINGS Osseous: No acute fracture is present. No focal lytic or blastic lesions are present. Orbits: Left periorbital soft tissue swelling is present. The globes and orbits are within normal limits. No acute fracture is present. Sinuses: The paranasal sinuses and mastoid air cells are clear. Soft tissues: Soft tissues of the face are otherwise unremarkable. No additional soft tissue injury is evident. CT CERVICAL SPINE FINDINGS Alignment: AP alignment is anatomic. Mild rightward curvature is present. There is straightening and slight reversal of the normal cervical lordosis. Skull base and vertebrae: The skullbase is within normal limits. Vertebral body heights are maintained. No acute fracture is present. Soft tissues and spinal canal: The soft tissues of the  neck demonstrate bilateral enlargement of the thyroid. No discrete mass lesion is present. There is no significant adenopathy. No other focal soft tissue abnormality is present. Disc levels:  No significant focal stenosis is evident. Upper chest: The visualized lung apices are clear. IMPRESSION: 1. Left periorbital soft tissue swelling without underlying fracture. 2. No acute intracranial abnormality. 3. No other significant facial injury. 4. Straightening and slight reversal of the normal cervical lordosis without evidence for acute fracture or traumatic subluxation in the cervical spine. 5. Enlarged thyroid gland without discrete lesion. Electronically Signed   By: Marin Roberts M.D.   On: 02/16/2017 16:58   Ct Cervical Spine Wo Contrast  Result Date: 02/16/2017 CLINICAL DATA:  Fall. Hit head on night stand. Seizure. Persistent confusion. EXAM: CT HEAD WITHOUT CONTRAST CT MAXILLOFACIAL WITHOUT CONTRAST CT CERVICAL SPINE WITHOUT CONTRAST TECHNIQUE: Multidetector CT imaging of the head, cervical spine, and maxillofacial structures were performed using the standard protocol without intravenous contrast. Multiplanar CT image reconstructions of the cervical spine and maxillofacial structures were also generated. COMPARISON:  None. FINDINGS: CT HEAD FINDINGS Brain: Mild generalized atrophy is present. No acute infarct, hemorrhage, or mass lesion is present. The ventricles are of normal size. No significant extraaxial fluid collection is present. The brainstem and cerebellum are normal. The internal auditory canals are within normal limits. Vascular: No hyperdense vessel or unexpected calcification. Skull: Left periorbital soft tissue swelling is noted. There is no underlying fracture. The calvarium is intact. No significant extracranial soft tissue injury is present otherwise. CT MAXILLOFACIAL FINDINGS Osseous: No acute fracture is present. No focal lytic or blastic lesions are present. Orbits: Left  periorbital soft tissue swelling is present. The globes and orbits are within normal limits. No acute fracture is present. Sinuses: The paranasal sinuses and mastoid air cells  are clear. Soft tissues: Soft tissues of the face are otherwise unremarkable. No additional soft tissue injury is evident. CT CERVICAL SPINE FINDINGS Alignment: AP alignment is anatomic. Mild rightward curvature is present. There is straightening and slight reversal of the normal cervical lordosis. Skull base and vertebrae: The skullbase is within normal limits. Vertebral body heights are maintained. No acute fracture is present. Soft tissues and spinal canal: The soft tissues of the neck demonstrate bilateral enlargement of the thyroid. No discrete mass lesion is present. There is no significant adenopathy. No other focal soft tissue abnormality is present. Disc levels:  No significant focal stenosis is evident. Upper chest: The visualized lung apices are clear. IMPRESSION: 1. Left periorbital soft tissue swelling without underlying fracture. 2. No acute intracranial abnormality. 3. No other significant facial injury. 4. Straightening and slight reversal of the normal cervical lordosis without evidence for acute fracture or traumatic subluxation in the cervical spine. 5. Enlarged thyroid gland without discrete lesion. Electronically Signed   By: Marin Roberts M.D.   On: 02/16/2017 16:58   US Renal  Result Date: 02/17/2017 CLINICAL DATA:  Acute renal failure. EXAM: RENAL / URINARY TRACT ULTRASOUND COMPLETE COMPARISON:  01/24/2017 FINDINGS: The examination was limited as the patient was intubated and unable to follow breathing instructions. Right Kidney: Length: 9.0 cm. Increased parenchymal echogenicity. 1.4 cm hypoechoic upper pole lesion was better characterized as a simple cyst on the recent prior ultrasound. No hydronephrosis. Left Kidney: Length: 9.7 cm. Increased parenchymal echogenicity. 1.2 cm the hypoechoic interpolar  lesion was better characterized as a simple cyst on the recent prior ultrasound. No hydronephrosis. Bladder: Decompressed by Foley catheter. IMPRESSION: Echogenic kidneys compatible with medical renal disease. No hydronephrosis. Electronically Signed   By: Sebastian Ache M.D.   On: 02/17/2017 11:38   Portable Chest Xray  Result Date: 02/17/2017 CLINICAL DATA:  Respiratory failure. EXAM: PORTABLE CHEST 1 VIEW COMPARISON:  02/16/2017. FINDINGS: Endotracheal tube and NG tube in stable position. Heart size normal. Mild bibasilar subsegmental atelectasis. No pleural effusion or pneumothorax. IMPRESSION: 1. Lines and tubes in stable position. 2. Low lung volumes with mild bibasilar subsegmental atelectasis. Electronically Signed   By: Maisie Fus  Register   On: 02/17/2017 06:49   Dg Chest Portable 1 View  Result Date: 02/16/2017 CLINICAL DATA:  56 year old male status post intubation. EXAM: PORTABLE CHEST 1 VIEW COMPARISON:  Chest x-ray a 02/16/2017. FINDINGS: An endotracheal tube is in place with tip 2.8 cm above the carina. A nasogastric tube is seen extending into the stomach, however, the tip of the nasogastric tube extends below the lower margin of the image. Lung volumes are low. No consolidative airspace disease. No pleural effusions. No pneumothorax. No suspicious appearing pulmonary nodules or masses. Heart size is normal. Upper mediastinal contours are within normal limits. IMPRESSION: 1. Support apparatus, as above. 2. Low lung volumes without radiographic evidence of acute cardiopulmonary disease. Electronically Signed   By: Trudie Reed M.D.   On: 02/16/2017 19:29   Dg Chest Port 1 View  Result Date: 02/16/2017 CLINICAL DATA:  Recent seizure and fall, initial encounter EXAM: PORTABLE CHEST 1 VIEW COMPARISON:  None. FINDINGS: Cardiac shadow is within normal limits. The lungs are well aerated bilaterally. No acute bony abnormality is seen. IMPRESSION: No acute abnormality noted. Electronically Signed    By: Alcide Clever M.D.   On: 02/16/2017 17:11   Ct Maxillofacial Wo Contrast  Result Date: 02/16/2017 CLINICAL DATA:  Fall. Hit head on night stand. Seizure. Persistent confusion. EXAM:  CT HEAD WITHOUT CONTRAST CT MAXILLOFACIAL WITHOUT CONTRAST CT CERVICAL SPINE WITHOUT CONTRAST TECHNIQUE: Multidetector CT imaging of the head, cervical spine, and maxillofacial structures were performed using the standard protocol without intravenous contrast. Multiplanar CT image reconstructions of the cervical spine and maxillofacial structures were also generated. COMPARISON:  None. FINDINGS: CT HEAD FINDINGS Brain: Mild generalized atrophy is present. No acute infarct, hemorrhage, or mass lesion is present. The ventricles are of normal size. No significant extraaxial fluid collection is present. The brainstem and cerebellum are normal. The internal auditory canals are within normal limits. Vascular: No hyperdense vessel or unexpected calcification. Skull: Left periorbital soft tissue swelling is noted. There is no underlying fracture. The calvarium is intact. No significant extracranial soft tissue injury is present otherwise. CT MAXILLOFACIAL FINDINGS Osseous: No acute fracture is present. No focal lytic or blastic lesions are present. Orbits: Left periorbital soft tissue swelling is present. The globes and orbits are within normal limits. No acute fracture is present. Sinuses: The paranasal sinuses and mastoid air cells are clear. Soft tissues: Soft tissues of the face are otherwise unremarkable. No additional soft tissue injury is evident. CT CERVICAL SPINE FINDINGS Alignment: AP alignment is anatomic. Mild rightward curvature is present. There is straightening and slight reversal of the normal cervical lordosis. Skull base and vertebrae: The skullbase is within normal limits. Vertebral body heights are maintained. No acute fracture is present. Soft tissues and spinal canal: The soft tissues of the neck demonstrate  bilateral enlargement of the thyroid. No discrete mass lesion is present. There is no significant adenopathy. No other focal soft tissue abnormality is present. Disc levels:  No significant focal stenosis is evident. Upper chest: The visualized lung apices are clear. IMPRESSION: 1. Left periorbital soft tissue swelling without underlying fracture. 2. No acute intracranial abnormality. 3. No other significant facial injury. 4. Straightening and slight reversal of the normal cervical lordosis without evidence for acute fracture or traumatic subluxation in the cervical spine. 5. Enlarged thyroid gland without discrete lesion. Electronically Signed   By: Marin Roberts M.D.   On: 02/16/2017 16:58       Today   Subjective:   Tanda Rockers  patient confused and agitated  Objective:   Blood pressure 127/68, pulse (!) 103, temperature 98.2 F (36.8 C), temperature source Oral, resp. rate 16, height 5\' 10"  (1.778 m), weight 198 lb 6.6 oz (90 kg), SpO2 100 %.  .  Intake/Output Summary (Last 24 hours) at 02/27/17 0943 Last data filed at 02/27/17 0610  Gross per 24 hour  Intake                0 ml  Output              500 ml  Net             -500 ml    Exam VITAL SIGNS: Blood pressure 127/68, pulse (!) 103, temperature 98.2 F (36.8 C), temperature source Oral, resp. rate 16, height 5\' 10"  (1.778 m), weight 198 lb 6.6 oz (90 kg), SpO2 100 %.  GENERAL:  56 y.o.-year-old patient lying in the bed Agitated  EYES: Pupils equal, round, reactive to light and accommodation. No scleral icterus. Extraocular muscles intact.  HEENT: Head atraumatic, normocephalic. Oropharynx and nasopharynx clear.  NECK:  Supple, no jugular venous distention. No thyroid enlargement, no tenderness.  LUNGS: Normal breath sounds bilaterally, no wheezing, rales,rhonchi or crepitation. No use of accessory muscles of respiration.  CARDIOVASCULAR: S1, S2 normal. No murmurs, rubs,  or gallops.  ABDOMEN: Soft, nontender,  nondistended. Bowel sounds present. No organomegaly or mass.  EXTREMITIES: No pedal edema, cyanosis, or clubbing.  NEUROLOGIC: Agitated unable to do a neuro exam  PSYCHIATRIC:Agitated  SKIN: No obvious rash, lesion, or ulcer.   Data Review     CBC w Diff: Lab Results  Component Value Date   WBC 10.9 (H) 02/24/2017   HGB 8.5 (L) 02/24/2017   HGB 13.9 07/27/2008   HCT 25.7 (L) 02/24/2017   HCT 42.7 07/27/2008   PLT 221 02/24/2017   PLT 184 07/27/2008   LYMPHOPCT 2 02/16/2017   LYMPHOPCT 44.5 07/27/2008   MONOPCT 3 02/16/2017   MONOPCT 6.7 07/27/2008   EOSPCT 0 02/16/2017   EOSPCT 2.9 07/27/2008   BASOPCT 0 02/16/2017   BASOPCT 0.7 07/27/2008   CMP: Lab Results  Component Value Date   NA 160 (H) 02/26/2017   NA 140 07/27/2008   K 4.0 02/26/2017   K 3.9 07/27/2008   CL 121 (H) 02/26/2017   CL 109 (H) 07/27/2008   CO2 27 02/26/2017   CO2 27 07/27/2008   BUN 60 (H) 02/26/2017   BUN 17 07/27/2008   CREATININE 6.48 (H) 02/26/2017   CREATININE 2.0 (H) 07/27/2008   PROT 7.4 02/24/2017   PROT 7.4 07/27/2008   ALBUMIN 2.6 (L) 02/26/2017   ALBUMIN 3.5 07/27/2008   BILITOT 0.5 02/24/2017   BILITOT 0.50 07/27/2008   ALKPHOS 76 02/24/2017   ALKPHOS 65 07/27/2008   AST 19 02/24/2017   AST 23 07/27/2008   ALT 20 02/24/2017   ALT 18 07/27/2008  .  Micro Results No results found for this or any previous visit (from the past 240 hour(s)).      Code Status Orders        Start     Ordered   02/26/17 1250  Do not attempt resuscitation (DNR)  Continuous    Question Answer Comment  In the event of cardiac or respiratory ARREST Do not call a "code blue"   In the event of cardiac or respiratory ARREST Do not perform Intubation, CPR, defibrillation or ACLS   In the event of cardiac or respiratory ARREST Use medication by any route, position, wound care, and other measures to relive pain and suffering. May use oxygen, suction and manual treatment of airway obstruction as  needed for comfort.      02/26/17 1249    Code Status History    Date Active Date Inactive Code Status Order ID Comments User Context   02/16/2017  8:35 PM 02/26/2017 12:49 PM Full Code [id number]  Enedina FinnerPatel, Sona, MD Inpatient   01/23/2017  6:53 PM 01/28/2017  8:06 PM Full Code [id number]  Enid BaasKalisetti, Radhika, MD Inpatient            Discharge Medications   Allergies as of 02/27/2017   No Known Allergies     Medication List    STOP taking these medications   amLODipine 5 MG tablet Commonly known as:  NORVASC   ezetimibe 10 MG tablet Commonly known as:  ZETIA   fluPHENAZine 5 MG tablet Commonly known as:  PROLIXIN   furosemide 40 MG tablet Commonly known as:  LASIX   gemfibrozil 600 MG tablet Commonly known as:  LOPID   metoprolol succinate 100 MG 24 hr tablet Commonly known as:  TOPROL-XL   pantoprazole 40 MG tablet Commonly known as:  PROTONIX   polyethylene glycol packet Commonly known as:  MIRALAX / GLYCOLAX   potassium chloride  10 MEQ tablet Commonly known as:  K-DUR   pravastatin 40 MG tablet Commonly known as:  PRAVACHOL   risperidone 4 MG tablet Commonly known as:  RISPERDAL   SPIRIVA RESPIMAT 2.5 MCG/ACT Aers Generic drug:  Tiotropium Bromide Monohydrate   tamsulosin 0.4 MG Caps capsule Commonly known as:  FLOMAX   tiotropium 18 MCG inhalation capsule Commonly known as:  SPIRIVA   traZODone 150 MG tablet Commonly known as:  DESYREL   Vitamin D3 5000 units Tabs     TAKE these medications   diltiazem 30 MG tablet Commonly known as:  CARDIZEM Take 1 tablet (30 mg total) by mouth every 6 (six) hours.   LORazepam 1 MG tablet Commonly known as:  ATIVAN Take 1 tablet (1 mg total) by mouth every 4 (four) hours as needed for anxiety.   morphine CONCENTRATE 10 MG/0.5ML Soln concentrated solution Place 0.25 mLs (5 mg total) under the tongue every 2 (two) hours as needed for moderate pain or severe pain (dyspnea/air hunger).   SEROQUEL XR 400 MG  24 hr tablet Generic drug:  QUEtiapine Take by mouth. What changed:  Another medication with the same name was changed. Make sure you understand how and when to take each.   QUEtiapine 100 MG tablet Commonly known as:  SEROQUEL Take 1 tablet (100 mg total) by mouth at bedtime. What changed:  medication strength  how much to take  when to take this  additional instructions          Total Time in preparing paper work, data evaluation and todays exam - 35 minutes  Auburn Bilberry M.D on 02/27/2017 at 9:43 AM  [facility]   Office  [phone]

## 2017-02-27 NOTE — Progress Notes (Signed)
Pt is very restless at times and tries to get out of bed.  Sitter remains at bedside.  Pt does follow simple commands, but has to be coaxed.

## 2017-02-27 NOTE — Clinical Social Work Note (Signed)
Palliative Care spoke with patient's mother and patient's mother has decided upon hospice home of [place]. Clydie BraunKaren with [facility] has made arrangements for patient to transfer today.  York SpanielMonica Lincon Sahlin MSW,LCSW [phone]

## 2017-02-28 LAB — VALPROIC ACID LEVEL, FREE: Valproic Acid, Free: 84 ug/mL — ABNORMAL HIGH (ref 6.0–22.0)

## 2017-03-03 ENCOUNTER — Ambulatory Visit: Payer: Self-pay

## 2017-03-05 DEATH — deceased

## 2018-07-20 IMAGING — CT CT CERVICAL SPINE W/O CM
5 of 10 series · 10 of 33 positions shown, 11 images · non-contrast
Comparison: None.

CLINICAL DATA: Fall. Hit head on night stand. Seizure. Persistent
confusion.

EXAM:
CT HEAD WITHOUT CONTRAST
CT MAXILLOFACIAL WITHOUT CONTRAST
CT CERVICAL SPINE WITHOUT CONTRAST
TECHNIQUE: Multidetector CT imaging of the head, cervical spine, and
maxillofacial structures were performed using the standard protocol
without intravenous contrast. Multiplanar CT image reconstructions
of the cervical spine and maxillofacial structures were also
generated.

[Series 6: max soft · axial · 0.31mm/px · z∈[-214,-160]mm · 2 of 82 slices shown]
[im 28/82  soft-tissue]
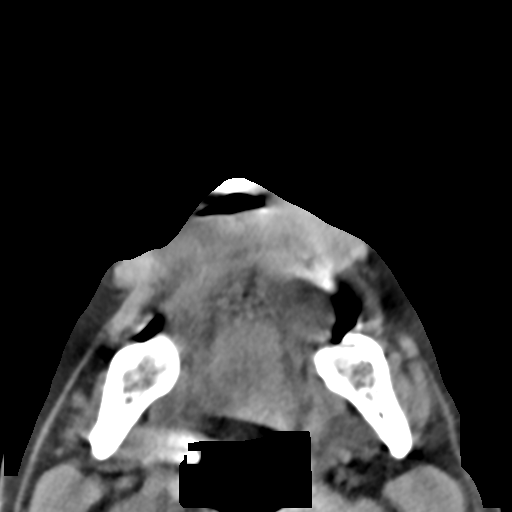
[im 55/82  soft-tissue]
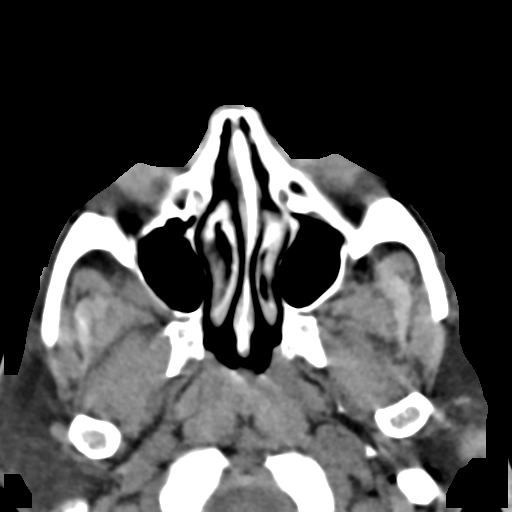

[Series 7: c spine soft · axial · 0.38mm/px · z∈[-248,-190]mm · 2 of 88 slices shown]
[im 30/88  soft-tissue]
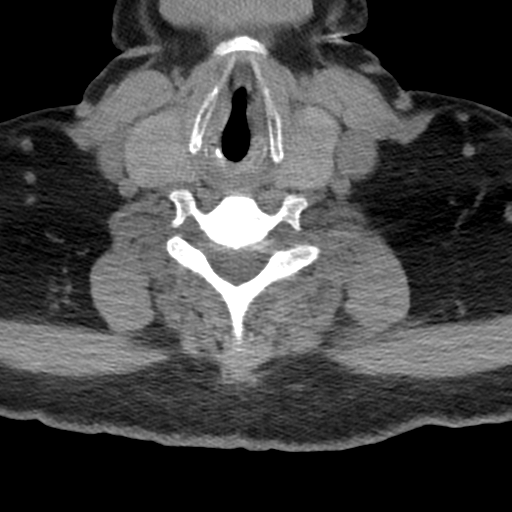
[im 59/88  soft-tissue]
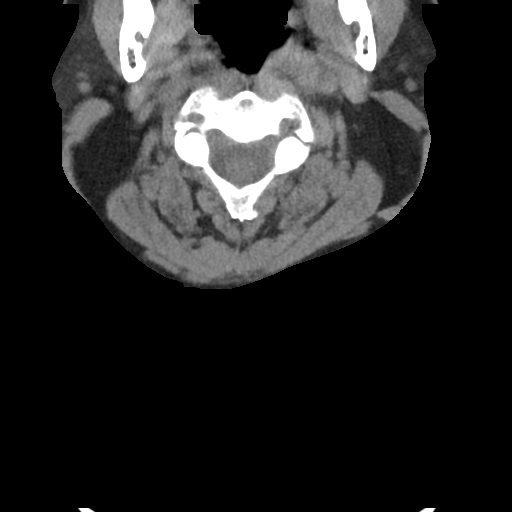

[Series 11: coronal soft tissue · coronal · 0.28mm/px · 1 of 69 slices shown]
[im 35/69  bone]
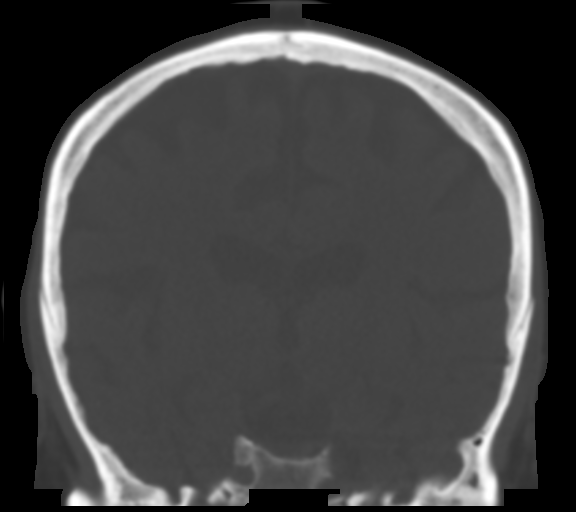

[Series 13: sagittal bone · sagittal · 0.29mm/px · 2 of 78 slices shown]
[im 26/78  bone]
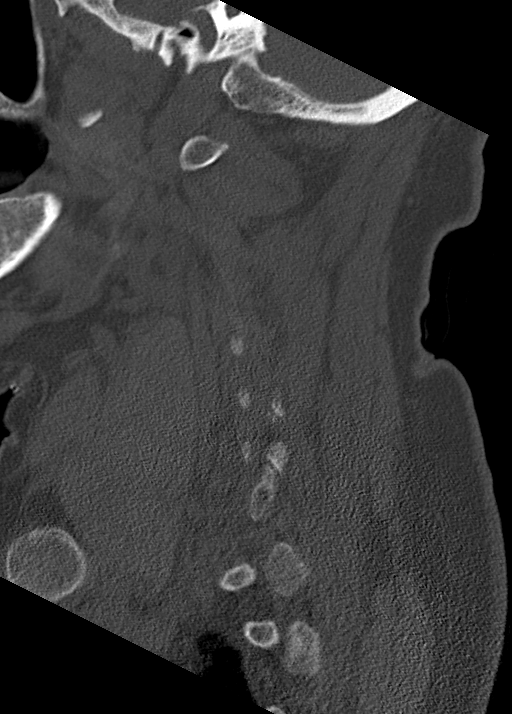
[im 52/78  bone]
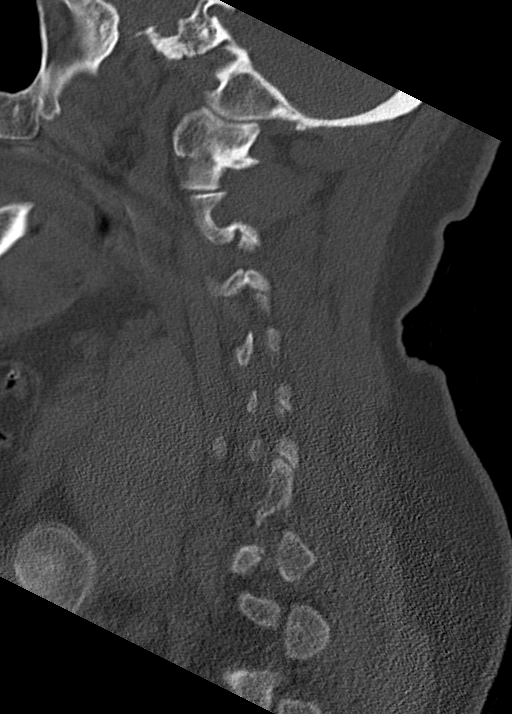

[Series 15: orthogonal axials · axial · 0.25mm/px · z∈[-294,-201]mm · 3 of 107 slices shown, 4 images]
[im 27/107  soft-tissue]
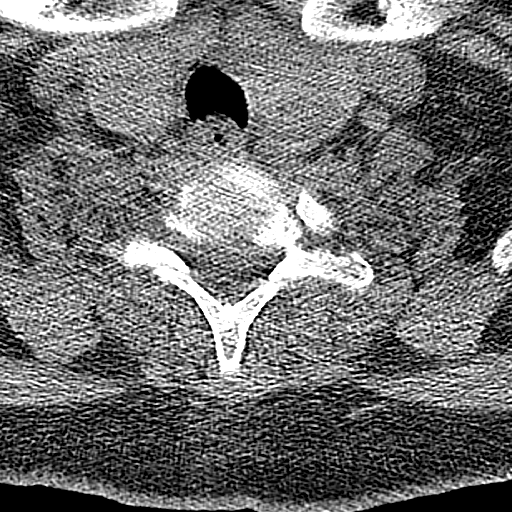
[im 27/107  bone]
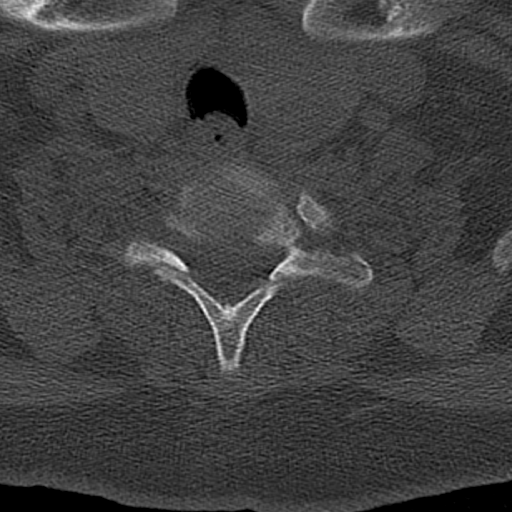
[im 54/107  bone]
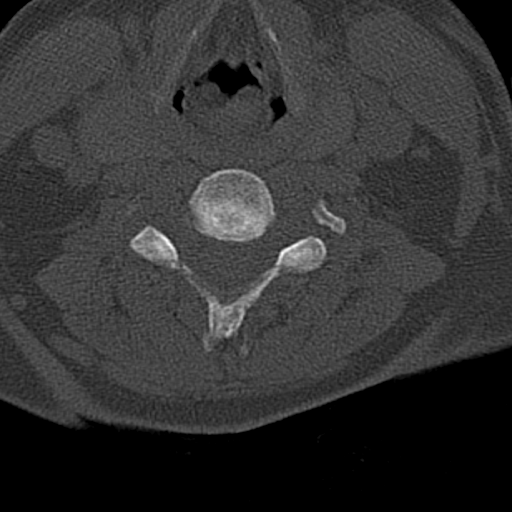
[im 80/107  bone]
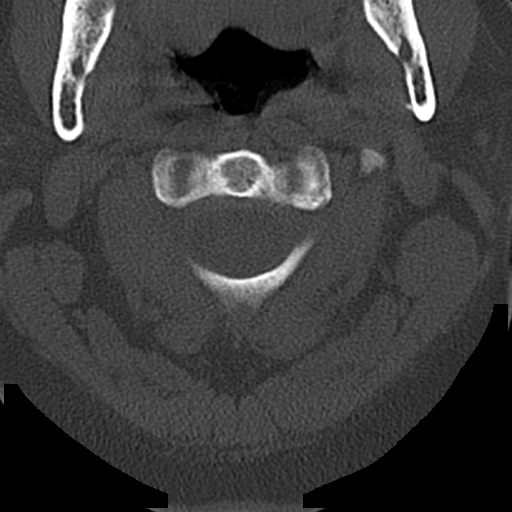

[10 of 33 positions shown; findings below may reference images not displayed]

FINDINGS: CT HEAD FINDINGS

Brain: Mild generalized atrophy is present. No acute infarct,
hemorrhage, or mass lesion is present. The ventricles are of normal
size. No significant extraaxial fluid collection is present. The
brainstem and cerebellum are normal. The internal auditory canals
are within normal limits.

Vascular: No hyperdense vessel or unexpected calcification.

Skull: Left periorbital soft tissue swelling is noted. There is no
underlying fracture. The calvarium is intact. No significant
extracranial soft tissue injury is present otherwise.

CT MAXILLOFACIAL FINDINGS

Osseous: No acute fracture is present. No focal lytic or blastic
lesions are present.

Orbits: Left periorbital soft tissue swelling is present. The globes
and orbits are within normal limits. No acute fracture is present.

Sinuses: The paranasal sinuses and mastoid air cells are clear.

Soft tissues: Soft tissues of the face are otherwise unremarkable.
No additional soft tissue injury is evident.

CT CERVICAL SPINE FINDINGS

Alignment: AP alignment is anatomic. Mild rightward curvature is
present. There is straightening and slight reversal of the normal
cervical lordosis.

Skull base and vertebrae: The skullbase is within normal limits.
Vertebral body heights are maintained. No acute fracture is present.

Soft tissues and spinal canal: The soft tissues of the neck
demonstrate bilateral enlargement of the thyroid. No discrete mass
lesion is present. There is no significant adenopathy. No other
focal soft tissue abnormality is present.

Disc levels:  No significant focal stenosis is evident.

Upper chest: The visualized lung apices are clear.
IMPRESSION: 1. Left periorbital soft tissue swelling without underlying
fracture.
2. No acute intracranial abnormality.
3. No other significant facial injury.
4. Straightening and slight reversal of the normal cervical lordosis
without evidence for acute fracture or traumatic subluxation in the
cervical spine.
5. Enlarged thyroid gland without discrete lesion.
